# Patient Record
Sex: Female | Born: 1995 | Hispanic: No | Marital: Single
Health system: Midwestern US, Community
[De-identification: ages and names within clinical notes are randomized; demographics above are authoritative.]

## PROBLEM LIST (undated history)

## (undated) DIAGNOSIS — E282 Polycystic ovarian syndrome: Secondary | ICD-10-CM

## (undated) DIAGNOSIS — I10 Essential (primary) hypertension: Secondary | ICD-10-CM

## (undated) HISTORY — PX: NO PAST SURGERIES: SHX2092

---

## 2015-07-06 ENCOUNTER — Encounter: Payer: BLUE CROSS/BLUE SHIELD | Primary: Family Medicine

## 2015-07-19 ENCOUNTER — Inpatient Hospital Stay: Admit: 2015-07-19 | Payer: BLUE CROSS/BLUE SHIELD | Primary: Family Medicine

## 2015-07-19 DIAGNOSIS — E669 Obesity, unspecified: Secondary | ICD-10-CM

## 2015-07-19 NOTE — Progress Notes (Signed)
[facility]  [address], [place], [place] [id number] - Phone: [phone]     Nutrition Assessment ??? Medical Nutrition Therapy   Outpatient  Initial Evaluation         Patient Name: Stacy Green DOB: 10/22/1995   Treatment Diagnosis: Eino Farberbersity Onset Date:    Referral Source: Caroleen Hammanamle, Anant, MD Start of Care [facility](SOC): 07/19/2015     Ht:  68 in  cm Wt:  292 lb  kg   BMI: 44.4  BMR female    BMR female 2100     Diagnosis:         Medications of Nutritional Significance:   N/a     Subjective/Assessment:  Pt is a 20 yo female who is looking to lose weight and live a healthier lifestyle. She has recently been diagnosed with an enlarged liver/fatty liver. She lives with her boyfriend and the prepare meals together. 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 SHe drinks about 4 sodas a week and does eat friend foods. She does not drink or smoke. Pt has been walking with friends about 3 times per week when able. Discussed eating with 1-2 hours of waking, going no longer than 5 hours without eating, but no closer than 3 hours, and stopping eating 2 hours before bed. Discussed she should drink water and Sugar Free beverages only. Weaning off fried foods as well. Complete at least 120 mins of physical activity each week, divided as schedule permits. Pt is motivated to make changes especially with having Live issues. Taught about meal timing, composition, and balanced meals/portion control. Pt expressed comprehension, high motivation, and compliance is expected.         Current Eating Patterns:  0900-1000: wake up  1300-1400: fried chicken and potato wedges  1930: fried chicken and potato wedges  2200: chips or cookie  2430: bed     Handouts/  Information Provided:   Carbohydrates    Protein    Fiber    Serving Sizes    Meal and Snack Ideas    Food Journals   Diabetes    Cholesterol    Sodium     Gen Nutr Guidelines    SBGM Guidelines    Others: Veggies   Estimate Needs:   Calories:  1900 Protein: 166 Carbs: 166 Fat: 63   Kcal/day  g/day  g/day  g/day         percent: 35 percent: 35 percent: 30     Nutritional Goal - To promote lifestyle changes to result in:      weight loss    Improved diabetic control    Decreased cholesterol levels    Decreased blood pressure    weight maintenance   Preventing any interactions associated with food allergies    Adequate weight gain toward goal weight    Other: Fatty Liver       Recommendations: 1. Follow good meal timing  2. Never eat carbs alone, always pair with protein, use the plate method  3. Carbs: 30-45 gm per meal; 15-30 gm at snacks; start to wean off fried foods and soda  4. Exercise 3-4 times per week for 30 min     Information Reviewed with: Patient   Potential Barriers to Learning: None     Dietitian Signature: Aundra MilletKari Euclide Granito, MA RD Date: 07/19/2015   Follow-up: 09-02-15 @ 1000 Time: 2:39 PM

## 2015-09-02 ENCOUNTER — Encounter: Primary: Family Medicine

## 2015-09-06 ENCOUNTER — Encounter: Payer: BLUE CROSS/BLUE SHIELD | Primary: Family Medicine

## 2019-08-04 ENCOUNTER — Ambulatory Visit: Admit: 2019-08-04 | Discharge: 2019-08-04 | Payer: BLUE CROSS/BLUE SHIELD | Attending: Family | Primary: Family Medicine

## 2019-08-04 ENCOUNTER — Ambulatory Visit: Attending: Family | Primary: Family Medicine

## 2019-08-04 NOTE — Progress Notes (Signed)
Initial Consultation for Bariatric Surgery   Stacy Green is a 24 y.o. female who comes into the office today for initial consultation for the surgical options for the treatment of morbid obesity. The patient initially identified obesity at the age of 17 and at age 17 weighed 250 lbs. She has tried a variety of unsupervised weight-loss attempts including self imposed and exercise, but has yet to meet with lasting success.  Maximum weight lost on a diet is about 10 lbs, but that the weight loss always seems to return. Today, the patient is  Height: 5' 8" (172.7 cm) tall, Weight: 153.3 kg (338 lb) lbs for a Body mass index is 51.39 kg/m??.  It is due to the patient's severe obesity, which is further complicated by PCOS/insulin rresistance   that the patient is now seeking out bariatric surgery, specifically, sleeve gastrectomy.  Describes herself as a snacker on chips, drinking water/tea/soda/ juice.     Past Medical History:   Diagnosis Date   ??? PCOS (polycystic ovarian syndrome)      History reviewed. No pertinent surgical history.    No Known Allergies    Social History     Tobacco Use   ??? Smoking status: Never Smoker   ??? Smokeless tobacco: Never Used   Substance Use Topics   ??? Alcohol use: Yes     Alcohol/week: 1.0 standard drinks     Types: 1 Glasses of wine per week   ??? Drug use: Never     No family history on file.    No family status information on file.     Review of Systems:  Positive in BOLD  CONST: Fever, weight loss, fatigue or chills  GI: Nausea, vomiting, abdominal pain, change in bowel habits,  hematochezia, melena, and GERD   INTEG: Dermatitis, abnormal moles  HEENT: Recent changes in vision, vertigo, epistaxis, dysphagia and hoarseness  CV: Chest pain, palpitations, HTN, edema and varicosities  RESP: Cough, shortness of breath, wheezing, hemoptysis, snoring and reactive airway disease  GU: Hematuria, dysuria, frequency, urgency, nocturia and stress urinary incontinence   MS: Weakness, joint  pain and arthritis  ENDO: Diabetes, insulin resistant, thyroid disease, polyuria, polydipsia, polyphagia, poor wound healing, heat intolerance, cold intolerance  LYMPH/HEME: Anemia, bruising and history of blood transfusions  NEURO: Dizziness, headache, fainting, seizures and stroke  PSYCH: Anxiety and depression    Physical Exam    Visit Vitals  BP 125/81   Pulse 98   Temp 97.7 ??F (36.5 ??C)   Resp 17   Ht 5' 8" (1.727 m)   Wt 153.3 kg (338 lb)   SpO2 98%   BMI 51.39 kg/m??             Physical Exam  Vitals and nursing note reviewed.   Constitutional:       Appearance: She is obese.   HENT:      Head: Normocephalic and atraumatic.   Eyes:      Pupils: Pupils are equal, round, and reactive to light.   Neck:      Vascular: No carotid bruit.   Cardiovascular:      Rate and Rhythm: Normal rate.      Pulses: Normal pulses.      Heart sounds: Normal heart sounds.   Pulmonary:      Effort: Pulmonary effort is normal.   Abdominal:      General: Bowel sounds are normal. There is no distension.      Palpations: Abdomen is soft.   There is no mass.      Tenderness: There is no abdominal tenderness.      Hernia: No hernia is present.   Musculoskeletal:         General: No swelling or tenderness. Normal range of motion.      Cervical back: Normal range of motion and neck supple. No tenderness.      Right lower leg: No edema.      Left lower leg: No edema.   Skin:     General: Skin is warm and dry.      Capillary Refill: Capillary refill takes less than 2 seconds.   Neurological:      General: No focal deficit present.      Mental Status: She is alert and oriented to person, place, and time.   Psychiatric:         Mood and Affect: Mood and affect normal.         Behavior: Behavior normal.             Impression:  Stacy Green is a 24 y.o. female who is suffering from morbid obesity with a BMI of 51  and comorbidities including PCOS and insulin resistance   who would benefit from bariatric surgery.  We have had an extensive  discussion with regard to the risks, benefits and likely outcomes of the operation. We've discussed the restrictive and malabsorptive nature of the gastric bypass and compared and contrasted with the sleeve gastrectomy.  The patient understands the likelihood of losing approximately 80% of their excess weight in 12 to 18 months. She also understands the risks including but not limited to bleeding, infection, need for reoperation, ulcers, leaks and strictures, bowel obstruction secondary to adhesions and internal hernias, DVT, PE, heart attack, stroke, and death. Patient also understands risks of inadequate weight loss, excess weight loss, vitamin insufficiency, protein malnutrition, excess skin, and loss of hair.  We have reviewed the components of a successful postoperative course including requirement for a high protein, low carbohydrate diet, 60 oz a day of zero calorie liquids, daily vitamin supplementation, daily exercise, regular follow-up, and participation in support groups. At this time we will enroll the patient in our bariatric program, undertake routine laboratory evaluation, chest X-ray, EKG, possible UGI and evaluation by  nutritionist as well as psychologist and pending their satisfactory completion of the preop evaluation, plan to perform a undecided.    The patient clearly understands that they have to complete the duration of WLT consecutively and if they miss a month restart will be required. Additionally, no showing the RD appointments may result in removal from the surgical weight loss program.

## 2019-08-04 NOTE — Progress Notes (Signed)
Stacy Green is a 24 y.o. female  Chief Complaint   Patient presents with   ??? New Patient     GBP consult     Visit Vitals  BP 125/81   Pulse 98   Temp 97.7 ??F (36.5 ??C)   Resp 17   Ht 5' 8" (1.727 m)   Wt 338 lb (153.3 kg)   SpO2 98%   BMI 51.39 kg/m??

## 2019-08-04 NOTE — Progress Notes (Signed)
 Stacy Green is a 24 y.o. female  Chief Complaint   Patient presents with   . New Patient     GBP consult     Visit Vitals  BP 125/81   Pulse 98   Temp 97.7 F (36.5 C)   Resp 17   Ht 5' 8 (1.727 m)   Wt 338 lb (153.3 kg)   SpO2 98%   BMI 51.39 kg/m

## 2019-08-04 NOTE — Progress Notes (Signed)
Initial Consultation for Bariatric Surgery   Horace Wishon is a 24 y.o. female who comes into the office today for initial consultation for the surgical options for the treatment of morbid obesity. The patient initially identified obesity at the age of 68 and at age 21 weighed 250 lbs. She has tried a variety of unsupervised weight-loss attempts including self imposed and exercise, but has yet to meet with lasting success.  Maximum weight lost on a diet is about 10 lbs, but that the weight loss always seems to return. Today, the patient is  Height: 5\' 8"  (172.7 cm) tall, Weight: 153.3 kg (338 lb) lbs for a Body mass index is 51.39 kg/m??.  It is due to the patient's severe obesity, which is further complicated by PCOS/insulin rresistance   that the patient is now seeking out bariatric surgery, specifically, sleeve gastrectomy.  Describes herself as a Engineer, petroleum on chips, drinking water/tea/soda/ juice.     Past Medical History:   Diagnosis Date   ??? PCOS (polycystic ovarian syndrome)      History reviewed. No pertinent surgical history.    No Known Allergies    Social History     Tobacco Use   ??? Smoking status: Never Smoker   ??? Smokeless tobacco: Never Used   Substance Use Topics   ??? Alcohol use: Yes     Alcohol/week: 1.0 standard drinks     Types: 1 Glasses of wine per week   ??? Drug use: Never     No family history on file.    No family status information on file.     Review of Systems:  Positive in BOLD  CONST: Fever, weight loss, fatigue or chills  GI: Nausea, vomiting, abdominal pain, change in bowel habits,  hematochezia, melena, and GERD   INTEG: Dermatitis, abnormal moles  HEENT: Recent changes in vision, vertigo, epistaxis, dysphagia and hoarseness  CV: Chest pain, palpitations, HTN, edema and varicosities  RESP: Cough, shortness of breath, wheezing, hemoptysis, snoring and reactive airway disease  GU: Hematuria, dysuria, frequency, urgency, nocturia and stress urinary incontinence   MS: Weakness, joint  pain and arthritis  ENDO: Diabetes, insulin resistant, thyroid disease, polyuria, polydipsia, polyphagia, poor wound healing, heat intolerance, cold intolerance  LYMPH/HEME: Anemia, bruising and history of blood transfusions  NEURO: Dizziness, headache, fainting, seizures and stroke  PSYCH: Anxiety and depression    Physical Exam    Visit Vitals  BP 125/81   Pulse 98   Temp 97.7 ??F (36.5 ??C)   Resp 17   Ht 5\' 8"  (1.727 m)   Wt 153.3 kg (338 lb)   SpO2 98%   BMI 51.39 kg/m??             Physical Exam  Vitals and nursing note reviewed.   Constitutional:       Appearance: She is obese.   HENT:      Head: Normocephalic and atraumatic.   Eyes:      Pupils: Pupils are equal, round, and reactive to light.   Neck:      Vascular: No carotid bruit.   Cardiovascular:      Rate and Rhythm: Normal rate.      Pulses: Normal pulses.      Heart sounds: Normal heart sounds.   Pulmonary:      Effort: Pulmonary effort is normal.   Abdominal:      General: Bowel sounds are normal. There is no distension.      Palpations: Abdomen is soft.  There is no mass.      Tenderness: There is no abdominal tenderness.      Hernia: No hernia is present.   Musculoskeletal:         General: No swelling or tenderness. Normal range of motion.      Cervical back: Normal range of motion and neck supple. No tenderness.      Right lower leg: No edema.      Left lower leg: No edema.   Skin:     General: Skin is warm and dry.      Capillary Refill: Capillary refill takes less than 2 seconds.   Neurological:      General: No focal deficit present.      Mental Status: She is alert and oriented to person, place, and time.   Psychiatric:         Mood and Affect: Mood and affect normal.         Behavior: Behavior normal.             Impression:  Marlisa Caridi is a 24 y.o. female who is suffering from morbid obesity with a BMI of 51  and comorbidities including PCOS and insulin resistance   who would benefit from bariatric surgery.  We have had an extensive  discussion with regard to the risks, benefits and likely outcomes of the operation. We've discussed the restrictive and malabsorptive nature of the gastric bypass and compared and contrasted with the sleeve gastrectomy.  The patient understands the likelihood of losing approximately 80% of their excess weight in 12 to 18 months. She also understands the risks including but not limited to bleeding, infection, need for reoperation, ulcers, leaks and strictures, bowel obstruction secondary to adhesions and internal hernias, DVT, PE, heart attack, stroke, and death. Patient also understands risks of inadequate weight loss, excess weight loss, vitamin insufficiency, protein malnutrition, excess skin, and loss of hair.  We have reviewed the components of a successful postoperative course including requirement for a high protein, low carbohydrate diet, 60 oz a day of zero calorie liquids, daily vitamin supplementation, daily exercise, regular follow-up, and participation in support groups. At this time we will enroll the patient in our bariatric program, undertake routine laboratory evaluation, chest X-ray, EKG, possible UGI and evaluation by  nutritionist as well as psychologist and pending their satisfactory completion of the preop evaluation, plan to perform a undecided.    The patient clearly understands that they have to complete the duration of WLT consecutively and if they miss a month restart will be required. Additionally, no showing the RD appointments may result in removal from the surgical weight loss program.

## 2019-08-14 NOTE — Telephone Encounter (Signed)
Hi Melissa, did you call to register the patient?  I didn't see any note about it.  Just asking.     Monica

## 2019-08-14 NOTE — Telephone Encounter (Signed)
Hi Melissa, did you call to register the patient?  I didn't see any note about it.  Just asking.     [employer]

## 2019-09-12 ENCOUNTER — Inpatient Hospital Stay: Admit: 2019-09-12 | Payer: BLUE CROSS/BLUE SHIELD | Primary: Family Medicine

## 2019-09-12 NOTE — Progress Notes (Signed)
 [facility]  [address] [facility] [address] [facility], [address]    Patient's Name: Stacy Green   Age: 24 y.o.  Date of Birth: 02/18/1996   Sex: female    Date:   09/12/2019    Insurance:              Session: 1 of  12  Revision:     Surgeon:  Dr. Jacques    Height: 5 f 8   Weight:    332      Lbs.     BMI: 53.6   Pounds Lost since last month: 6                 Pounds Gained since last month: 0    Starting Weight: 338     Previous Month's Weight: 338  Overall Pounds Lost: 6   Overall Pounds Gained: 0      Do you smoke?  None    Alcohol intake:  Number of drinks at a time:  1-2  Number of times a week: Sometimes, not weekly    Class Guidelines    Guidelines are reviewed with patient at the start of every class.    1. Patient understands that weight loss trial classes must be consecutive.  Patient understands if they miss a class, it is their responsibility to contact me to reschedule class.  I will reach out to patient after their first no show.  2.  Patient understands the expectations that weight maintenance/weight loss is expected during the classes.  Failure to demonstrate changes may result in one extra month of weight loss trial, followed by going back to see the surgeon.  Patient understands that they CAN NOT gain any weight during the weight loss trial.  Gaining weight will result in extra classes.  3. Patient is also instructed to be doing their labs, blood work, psych visit, support group and any other test that the surgeon has used while they are working on their weight loss trial.  4.  Patient was instructed to bring their blue binder to every class and appointment.      Other Pertinent Information:     Changes Made Since Last Class: Eating out less.  Cutting back on soda.      Eating Habits and Behaviors    Today in class, we started talking about the key diet principles.  We first focused on stopping liquid calories.  Patient was also educated on  carbohydrates.  Patient was instructed to start cutting out bread, rice, and pasta from the diet and start focusing more on meat and vegetables.      I then gave a power point, which focused on Label Reading.  In class, I gave patients a labels and we worked through a series of questions to help patients have a better understanding of label reading.  Patient was instructed to review the serving size.  Patient was encouraged to focus on protein and carbohydrates.  We also did a few label reading activities to help the patient become more familiar with label reading.      Patient's current diet habits include: 2 melas per day.  She states she is eating breakfast and dinner because she works the night shift.  She states after watching the video, she is going to work harder at 3 meals per day.  She states she is eating oatmeal or cereal.  We have talked about concentrating more  on protein at meals.  She states dinner may be meat and vegetables.  It may be spaghetti.  She states this just varies.  She is snacking on chips or kettle chips, which I talked about the carbohydrates that are in these and need to be cut out.  She is eating sweets 3 x a week, but states they are sugar free.  I have addressed with her, that sugar free does not mean calorie or carbohydrate free.    She states she is drinking sweet tea 2 x a week.  She understands the need to stop the liquid calories.        Physical Activity/Exercise    Comments:    We talked about exercise.  Patient was given reasons of why exercise is so important and how that can help with their long-term success.  I have encouraged patient to get a support system to help with the activity.    Currently for activity, patient is not doing anything.      Behavior Modification       Comments:  Behavior modifications were reinforced.  This included not eating in front of the TV, which could lead to bigger portions and eating when one is not hungry.  We also talked about the  importance of eating 3 meals per day.  Patient was encouraged to food journal to keep their daily carbohydrates less than 30 grams per meal.      Goals that patient wants to work on includes:  1. Prepping more and also eating 3 meals per day.  2.       Jacqueline Browning, MS RD  09/12/2019

## 2019-10-02 ENCOUNTER — Other Ambulatory Visit: Payer: Self-pay

## 2019-10-02 ENCOUNTER — Encounter (HOSPITAL_COMMUNITY): Payer: Self-pay | Admitting: *Deleted

## 2019-10-02 DIAGNOSIS — I10 Essential (primary) hypertension: Secondary | ICD-10-CM | POA: Insufficient documentation

## 2019-10-02 DIAGNOSIS — R102 Pelvic and perineal pain: Secondary | ICD-10-CM | POA: Diagnosis present

## 2019-10-02 NOTE — ED Triage Notes (Signed)
Pt c/o pelvic pain and vaginal bleeding x 3 weeks; pt states she was seen at her PCP and had a pelvic exam and has not received the results yet; pt states she took an at home pregnancy test tonight and it was positive

## 2019-10-03 ENCOUNTER — Emergency Department (HOSPITAL_COMMUNITY)
Admission: EM | Admit: 2019-10-03 | Discharge: 2019-10-03 | Disposition: A | Discharge status: Home / Self Care | Payer: BC Managed Care – PPO | Attending: Emergency Medicine | Admitting: Emergency Medicine

## 2019-10-03 DIAGNOSIS — R102 Pelvic and perineal pain: Secondary | ICD-10-CM

## 2019-10-03 HISTORY — DX: Essential (primary) hypertension: I10

## 2019-10-03 HISTORY — DX: Polycystic ovarian syndrome: E28.2

## 2019-10-03 LAB — URINALYSIS, ROUTINE W REFLEX MICROSCOPIC
Bilirubin Urine: NEGATIVE
Glucose, UA: NEGATIVE mg/dL
Ketones, ur: NEGATIVE mg/dL
Leukocytes,Ua: NEGATIVE
Nitrite: NEGATIVE
Protein, ur: NEGATIVE mg/dL
Specific Gravity, Urine: 1.025 (ref 1.005–1.030)
pH: 5 (ref 5.0–8.0)

## 2019-10-03 LAB — I-STAT BETA HCG BLOOD, ED (MC, WL, AP ONLY): I-stat hCG, quantitative: 61.7 m[IU]/mL — ABNORMAL HIGH (ref <5)

## 2019-10-03 LAB — HCG, QUANTITATIVE, PREGNANCY: hCG, Beta Chain, Quant, S: 72 m[IU]/mL — ABNORMAL HIGH (ref <5)

## 2019-10-03 LAB — POC URINE PREG, ED: Preg Test, Ur: NEGATIVE

## 2019-10-03 NOTE — ED Provider Notes (Signed)
[facility] EMERGENCY DEPARTMENT Provider Note   CSN: [id number] Arrival date & time: 10/02/19  2105     History Chief Complaint  Patient presents with  . Pelvic Pain    Theresa Patterson is a 24 y.o. female.  The history is provided by the patient.  Pelvic Pain This is a recurrent problem. The current episode started 12 to 24 hours ago. The problem has not changed since onset.Associated symptoms include abdominal pain. Pertinent negatives include no chest pain and no shortness of breath. Nothing aggravates the symptoms. Nothing relieves the symptoms.   Patient presents for pelvic pain and bleeding.  She reports recent issue of postcoital vaginal bleeding.  Seen by her PCP and had a Pap smear performed 1 week ago.  She was told her cervix was inflamed.  She reports all of her testing from her Pap smear was negative.  Her symptoms improved. However she had intercourse approximately 24 hours ago.  She then began having bleeding the next day.  She is now having mild lower pelvic pain  She reports home pregnancy test is positive.  She has never been pregnant  Past Medical History:  Diagnosis Date  . Hypertension   . Polycystic ovary syndrome     There are no problems to display for this patient.   History reviewed. No pertinent surgical history.   OB History   No obstetric history on file.     History reviewed. No pertinent family history.  Social History   Tobacco Use  . Smoking status: Never Smoker  . Smokeless tobacco: Never Used  Vaping Use  . Vaping Use: Never used  Substance Use Topics  . Alcohol use: Yes    Comment: occasionally  . Drug use: Not Currently    Home Medications Prior to Admission medications   Not on File    Allergies    Patient has no known allergies.  Review of Systems   Review of Systems  Constitutional: Negative for fever.  Respiratory: Negative for shortness of breath.   Cardiovascular: Negative for chest pain.  Gastrointestinal:  Positive for abdominal pain.  Genitourinary: Positive for flank pain, pelvic pain and vaginal bleeding. Negative for dyspareunia and dysuria.       Postcoital vaginal bleeding  All other systems reviewed and are negative.   Physical Exam Updated Vital Signs BP (!) 150/96 (BP Location: Right Arm)   Pulse 72   Temp 98.8 F (37.1 C) (Oral)   Resp 18   Ht 1.727 m (5\' 8" )   Wt (!) 150.6 kg   SpO2 98%   BMI 50.48 kg/m   Physical Exam CONSTITUTIONAL: Well developed/well nourished, no distress HEAD: Normocephalic/atraumatic EYES: EOMI/PERRL ENMT: Mucous membranes moist NECK: supple no meningeal signs SPINE/BACK:entire spine nontender CV: S1/S2 noted, no murmurs/rubs/gallops noted LUNGS: Lungs are clear to auscultation bilaterally, no apparent distress ABDOMEN: soft, nontender, no rebound or guarding, bowel sounds noted throughout abdomen, chaperone present for exam GU:no cva tenderness, patient declines pelvic exam NEURO: Pt is awake/alert/appropriate, moves all extremitiesx4.  No facial droop.   EXTREMITIES: pulses normal/equal, full ROM SKIN: warm, color normal PSYCH: no abnormalities of mood noted, alert and oriented to situation  ED Results / Procedures / Treatments   Labs (all labs ordered are listed, but only abnormal results are displayed) Labs Reviewed  URINALYSIS, ROUTINE W REFLEX MICROSCOPIC - Abnormal; Notable for the following components:      Result Value   APPearance HAZY (*)    Hgb urine dipstick LARGE (*)  Bacteria, UA RARE (*)    All other components within normal limits  HCG, QUANTITATIVE, PREGNANCY - Abnormal; Notable for the following components:   hCG, Beta Chain, Quant, S 72 (*)    All other components within normal limits  I-STAT BETA HCG BLOOD, ED ([facility], [facility], [facility] ONLY) - Abnormal; Notable for the following components:   I-stat hCG, quantitative 61.7 (*)    All other components within normal limits  POC URINE PREG, ED    EKG None  Radiology No  results found.  Procedures Procedures   Medications Ordered in ED Medications - No data to display  ED Course  I have reviewed the triage vital signs and the nursing notes.  Pertinent labs   results that were available during my care of the patient were reviewed by me and considered in my medical decision making (see chart for details).    MDM Rules/Calculators/A&P                          1:51 AM Patient declines pelvic exam, this appears reasonable since she just had Pap smear a week ago.  She has no concern for STDs We will check beta-hCG via labs Urinalysis pending 4:05 AM Beta hCG 72.  Patient denies any pain complaints.  She denies any bleeding at this time.  She had already declined pelvic exam. Patient is overall well-appearing. Advised this could be very early pregnancy. Patient with clinical stability, less likely ectopic pregnancy. Patient has no local OB/GYN care because she is from [place] with plans to move here. She is leaving town in 48 hours. She will return on the morning of July 24 for recheck beta hCG. I advised that if there is any new abdominal pain or heavy vaginal bleeding, she will return immediately prior to then  Final Clinical Impression(s) / ED Diagnoses Final diagnoses:  Pelvic pain in female    Rx / DC Orders ED Discharge Orders    None       Zadie Rhine, MD 10/03/19 [phone]

## 2019-10-03 NOTE — Discharge Instructions (Addendum)
PLEASE RETURN Saturday MORNING FOR A REPEAT HORMONE LEVEL TEST IF YOU HAVE ANY NEW PAIN, HEAVY BLEEDING IN THE NEXT 24 HOURS PLEASE RETURN TO THE ER

## 2019-10-04 ENCOUNTER — Other Ambulatory Visit: Payer: Self-pay

## 2019-10-04 ENCOUNTER — Encounter (HOSPITAL_COMMUNITY): Payer: Self-pay

## 2019-10-04 ENCOUNTER — Emergency Department (HOSPITAL_COMMUNITY)
Admission: EM | Admit: 2019-10-04 | Discharge: 2019-10-04 | Disposition: A | Discharge status: Home / Self Care | Payer: BC Managed Care – PPO | Attending: Emergency Medicine | Admitting: Emergency Medicine

## 2019-10-04 DIAGNOSIS — O200 Threatened abortion: Secondary | ICD-10-CM | POA: Diagnosis not present

## 2019-10-04 DIAGNOSIS — I10 Essential (primary) hypertension: Secondary | ICD-10-CM | POA: Diagnosis not present

## 2019-10-04 DIAGNOSIS — N939 Abnormal uterine and vaginal bleeding, unspecified: Secondary | ICD-10-CM | POA: Diagnosis present

## 2019-10-04 LAB — HCG, QUANTITATIVE, PREGNANCY: hCG, Beta Chain, Quant, S: 31 m[IU]/mL — ABNORMAL HIGH (ref <5)

## 2019-10-04 NOTE — ED Triage Notes (Signed)
Pt was told to come back for a repeat HCG. She was here 2 nights ago for pelvic pain and vaginal bleeding for the last 3 weeks. Home pregnancy test was positive

## 2019-10-04 NOTE — Discharge Instructions (Addendum)
Follow-up with your OB/GYN Monday or Tuesday.  If you start having severe abdominal pain or severe bleeding go to [facility] the OB/GYN division

## 2019-10-04 NOTE — ED Provider Notes (Signed)
[facility] EMERGENCY DEPARTMENT Provider Note   CSN: [id number] Arrival date & time: 10/04/19  3875     History Chief Complaint  Patient presents with  . blood work    Theresa Patterson is a 24 y.o. female.  Patient complains of some vaginal bleeding.  She was seen here a day and half ago had a positive pregnancy.  She has come back today for repeat quant  The history is provided by the patient. No language interpreter was used.  Vaginal Bleeding Quality:  Dark red Severity:  Mild Onset quality:  Sudden Timing:  Intermittent Progression:  Waxing and waning Chronicity:  New Menstrual history:  Irregular Possible pregnancy: yes   Context: not after intercourse   Relieved by:  Nothing Worsened by:  Nothing Ineffective treatments:  None tried Associated symptoms: no abdominal pain, no back pain and no fatigue        Past Medical History:  Diagnosis Date  . Hypertension   . Polycystic ovary syndrome     There are no problems to display for this patient.   History reviewed. No pertinent surgical history.   OB History    Gravida  1   Para      Term      Preterm      AB      Living        SAB      TAB      Ectopic      Multiple      Live Births              No family history on file.  Social History   Tobacco Use  . Smoking status: Never Smoker  . Smokeless tobacco: Never Used  Vaping Use  . Vaping Use: Never used  Substance Use Topics  . Alcohol use: Yes    Comment: occasionally  . Drug use: Not Currently    Home Medications Prior to Admission medications   Not on File    Allergies    Patient has no known allergies.  Review of Systems   Review of Systems  Constitutional: Negative for appetite change and fatigue.  HENT: Negative for congestion, ear discharge and sinus pressure.   Eyes: Negative for discharge.  Respiratory: Negative for cough.   Cardiovascular: Negative for chest pain.  Gastrointestinal: Negative for  abdominal pain and diarrhea.  Genitourinary: Positive for vaginal bleeding. Negative for frequency and hematuria.  Musculoskeletal: Negative for back pain.  Skin: Negative for rash.  Neurological: Negative for seizures and headaches.  Psychiatric/Behavioral: Negative for hallucinations.    Physical Exam Updated Vital Signs BP (!) 136/87 (BP Location: Left Arm)   Pulse 90   Temp 98.3 F (36.8 C) (Oral)   Resp 14   SpO2 100%   Physical Exam Vitals and nursing note reviewed.  Constitutional:      Appearance: She is well-developed.  HENT:     Head: Normocephalic.     Nose: Nose normal.  Eyes:     General: No scleral icterus.    Conjunctiva/sclera: Conjunctivae normal.  Neck:     Thyroid: No thyromegaly.  Cardiovascular:     Rate and Rhythm: Normal rate and regular rhythm.     Heart sounds: No murmur heard.  No friction rub. No gallop.   Pulmonary:     Breath sounds: No stridor. No wheezing or rales.  Chest:     Chest wall: No tenderness.  Abdominal:     General: There is  no distension.     Tenderness: There is no abdominal tenderness. There is no rebound.  Musculoskeletal:        General: Normal range of motion.     Cervical back: Neck supple.  Lymphadenopathy:     Cervical: No cervical adenopathy.  Skin:    Findings: No erythema or rash.  Neurological:     Mental Status: She is oriented to person, place, and time.     Motor: No abnormal muscle tone.     Coordination: Coordination normal.  Psychiatric:        Behavior: Behavior normal.     ED Results / Procedures / Treatments   Labs (all labs ordered are listed, but only abnormal results are displayed) Labs Reviewed  HCG, QUANTITATIVE, PREGNANCY - Abnormal; Notable for the following components:      Result Value   hCG, Beta Chain, Quant, S 31 (*)    All other components within normal limits    EKG None  Radiology No results found.  Procedures Procedures (including critical care time)  Medications  Ordered in ED Medications - No data to display  ED Course  I have reviewed the triage vital signs and the nursing notes.  Pertinent labs & imaging results that were available during my care of the patient were reviewed by me and considered in my medical decision making (see chart for details).    MDM Rules/Calculators/A&P                          Patient with a decreasing quantitative beta-hCG.  Patient most likely having a miscarriage.  She will follow-up with her OB/GYN next week.  She will return sooner problems Final Clinical Impression(s) / ED Diagnoses Final diagnoses:  Threatened abortion    Rx / DC Orders ED Discharge Orders    None       Bethann Berkshire, MD 10/04/19 1012

## 2019-10-17 ENCOUNTER — Inpatient Hospital Stay: Admit: 2019-10-17 | Payer: BLUE CROSS/BLUE SHIELD | Primary: Family Medicine

## 2019-10-17 NOTE — Progress Notes (Signed)
 [facility]  [address] [facility] [address], [address]    Patient's Name: Stacy Green   Age: 24 y.o.  Date of Birth: 15-Aug-1995   Sex: female    Date:   10/17/2019    Insurance:            Session: 2 of  12  Revision:   Surgeon:  Dr. Jacques    Height: 5 f 8 Weight:    329      Lbs.   BMI: 50.1   Pounds Lost since last month: 3               Pounds Gained since last month: 0      Starting Weight: 338   Previous Month's Weight: 332  Overall Pounds Lost: 9 Overall Pounds Gained: 0      Do you smoke?  None    Alcohol intake:  Number of drinks at a time:  None  Number of times a week: None    Class Guidelines    Guidelines are reviewed with patient at the start of every class.    1. Patient understands that weight loss trial classes must be consecutive.  Patient understands if they miss a class, it is their responsibility to contact me to reschedule class.  I will reach out to patient after their first no show.  2.  Patient understands the expectations that weight maintenance/weight loss is expected during the classes.  Failure to demonstrate changes may result in one extra month of weight loss trial, followed by going back to see the surgeon.  Patient understands that they CAN NOT gain any weight during the weight loss trial.  Gaining weight will result in extra classes.  3. Patient is also instructed to be doing their labs, blood work, psych visit, support group and any other test that the surgeon has used while they are working on their weight loss trial.  4.  Patient was instructed to bring their blue binder to every class and appointment.      Other Pertinent Information:     Changes Made Since Last Class: More meal prep and less eating out.    Eating Habits and Behaviors    Today in class, we reviewed the key diet principles.  I have talked to patient about pushing the fluid and working towards 64 ounces per day.  We focused on following a low-calorie  diet.  Patient was instructed to count their carbohydrates and try to keep their daily intake under 75 grams per day and try to keep their daily protein at 80 grams per day.  Patient was given examples of carbohydrates in starches.  Patient was encouraged to focus on meat and vegetables and begin cutting carbohydrates out.  We talked about foods that are protein-based and how to incorporate those into their meals.  I also reviewed with patient the importance of eating 3 meals per day and suggestions were made for breakfast items.    Patient's current diet habits include: 3 meals per day.  Patient is boiled egg and [place] sausage.  Lunch: Salad, chicken salad, meat and greens.  Patient states she is using a sectional plate to help with portion sizes.  She is snacking on [place] pepperoni and yogurt.  She states she is not eating out.  She is drinking 64 ounces of water per day.  She may have zero calorie Gatorade.  Physical Activity/Exercise    Comments:    We talked about exercise.  Patient was given reasons of why exercise is so important and how that can help with their long-term success.  I have encouraged patient to get a support system to help with the activity.    Currently for activity, patient is aiming for 3 days a week of activity depending on her schedule.      Behavior Modification       Comments:  During today's lesson, I gave a presentation called The 100-200 Calorie Mindless Margin.  The goal is to make modest daily 100-200 calorie reductions in certain things that the body won't notice.  One, 100-200 calorie change and would will look 10-20 pounds in one year.  An example could be cutting soda.  Patient was given a check off list and was encouraged to come up with 1-3 100 calories changes they could make.  The check off list is a daily tracker to see if these goals are being met.        Goals that patient wants to work on includes:  1. Increase exercise.   2.       Jacqueline Browning, MS  RD  10/17/2019

## 2019-11-14 ENCOUNTER — Inpatient Hospital Stay: Admit: 2019-11-14 | Payer: BLUE CROSS/BLUE SHIELD | Primary: Family Medicine

## 2019-11-14 NOTE — Progress Notes (Signed)
[facility]  [address] [facility] [address], [address]    Patient's Name: Stacy Green   Age: 24 y.o.  Date of Birth: [birth year]   Sex: female    Date:   11/14/2019    Insurance:              Session: 3 of  12  Surgeon:  Dr. Karleen Hampshire    Height: 5 f 8   Weight:    329      Lbs.     BMI: 50.1   Pounds Lost since last month: 0                Pounds Gained since last month: 0    Starting Weight: 338     Previous Month's Weight: 329  Overall Pounds Lost: 9   Overall Pounds Gained: 0    Smoking:  None    Alcohol intake:  Number of drinks at a time:  None  Number of times a week: None    Class Guidelines    Guidelines are reviewed with patient at the start of every class.    1. Patient understands that weight loss trial classes must be consecutive.  Patient understands if they miss a class, it is their responsibility to contact me to reschedule class.  I will reach out to patient after their first no show.  2.  Patient understands the expectations that weight maintenance/weight loss is expected during the classes.  Failure to demonstrate changes may result in one extra month of weight loss trial, followed by going back to see the surgeon.    3. Patient is also instructed to be doing their labs, blood work, psych visit, support group and any other test that the surgeon has used while they are working on their weight loss trial.    Other Pertinent Information:     Changes Made Since Last Class: n/a    Eating Habits and Behaviors      During today's class, we continued to focus on the key diet principles.  Patient was instructed to follow a low carbohydrate diet, focusing on meat and vegetables.  Patient was instructed to stop liquid calories and aim for 64 ounces of water per day. We focused on focusing in on bigger problem areas to start making changes to, such as reducing fast food intake, reducing carbonated beverages/soda intake, decreasing carbohydrates intake  daily, etc. We reviewed protein shakes and high protein yogurts to chose, as well.      During today's class, we also talked about how to read a label.  Patient was given information on:  1. The benefits of reading a label, which allowed one to compare the nutritional value of similar products and make healthy food decisions.   2. The ingredient list, which can help to determine if the food is heathy or something that fits into the diet.  3. The importance of reading the serving size and making sure to apply that to the portion size that they are consuming.    Patient was also educated on carbohydrates.  I talked to patient about:  1. The function of carbohydrates.  2. Foods that carbohydrate-heavy.  3. Patient was given the guidelines to keep their carbohydrates less than 75 grams per day in the pre-op phase.  4. Patient was also given ideas of low carb swaps, which include zucchini noodles, spaghetti squash, or cauliflower rice.   5. Lower carbohydrate  fruit options were discussed.   6. Discussed lower carb swaps to use instead of potato chips.     Patient's dietary habits include: Patient is eating 3 meal per day.  Meals are made up of boiled eggs, smoothies, chicken salad, meat, vegetables.  Portions are:  Sectional plate.  Patient is eating out: few times a week.   Patient is snacking on [place] pepperoni, blueberries, banana, yogurt.  I have talked to patient about some lower carbohydrate snack choices that focused more protein.  Patient is drinking 64 ounces of fluid per day.  Fluid intake is make up of: water.         Physical Activity/Exercise     Comments:     Currently for exercise, patient is doing cardio for 3-4 x a week.  I have talked to patient about some suggestions to start an exercise routine.  Patient is encouraged to purchase a pedometer and use this to track her steps.  I have made some suggestions to patient of ways to incorporate exercise in with a busy lifestyle.  We also talked about You  Tube videos that can be used for an exercise routine.         Behavior Modification  Comments:  Behavior modifications were discussed with the patient. Some of those behavior modifications include:  1. Emphasized the importance of eating slowly, not eating and drinking meals at the same time.    2.  Taking 20-30 minutes to eat a meal  3. I have encouraged patient to follow journal, which may be done by paper or tracking it an app, such as My Fitness Pal or Bariatastic.  Patient understands the importance of following through with these behaviors following surgery to aid in long term weight loss. Tips and advice were given on how to start implementing these into the patient's life.      Patient has not yet attended the required bariatric support group.    Goal patient has set for next month:  1. Maintain current habits.  2.      Adela Ports, MS RD  11/14/2019

## 2019-12-12 ENCOUNTER — Inpatient Hospital Stay: Admit: 2019-12-12 | Payer: BLUE CROSS/BLUE SHIELD | Primary: Family Medicine

## 2019-12-12 NOTE — Progress Notes (Signed)
 [facility]  [facility]  [address] [facility] [address], [address]    Patient's Name: Stacy Green   Age: 24 y.o.  Date of Birth: Dec 21, 1995   Sex: female      Insurance:              Session: 4 of  12  Surgeon:  Dr. Jacques    Height: 5 f 8 Weight:    329      Lbs.   BMI:    Pounds Lost since last month: 0               Pounds Gained since last month: 0    Starting Weight: 338   Previous Month's Weight: 329  Overall Pounds Lost: 9 Overall Pounds Gained: 0      Do you smoke?  None    Alcohol intake:  Number of drinks at a time:  None  Number of times a week: None    Class Guidelines    Guidelines are reviewed with patient at the start of every class.    1. Patient understands that weight loss trial classes must be consecutive.  Patient understands if they miss a class, it is their responsibility to contact me to reschedule class.  I will reach out to patient after their first no show.  2.  Patient understands the expectations that weight maintenance/weight loss is expected during the classes.  Failure to demonstrate changes may result in one extra month of weight loss trial, followed by going back to see the surgeon.    3. Patient is also instructed to be doing their labs, blood work, psych visit, support group and any other test that the surgeon has used while they are working on their weight loss trial.  4. Patient is instructed to bring their education binder to all classes.        Changes Made Since Last Class:     Eating Habits and Behaviors      Today we reviewed key diet principles.   We talked about patient following a low calorie/low carbohydrate diet while they are in weight loss trials.  To achieve this, patient is encouraged to avoid liquid calories, including alcohol, soda, sweet tea, and fruit juices.   Patient can cut carbohydrates by trying to stick to meat and vegetables.  Patient can also eat eggs, cheese, and good fat, while trying to eliminate  starches, such as pasta, rice, crackers, chips, popcorn.    I also gave a power point that included 21 Ways to Stay on Track with a Healthy Lifestyle.  Some of the food-related suggestions included drinking plenty of water or calorie-free beverages prior to their meal.  Patient is encouraged to to fill up on protein first, which is the ultimate fill-me up food.  We talked about the importance of eating breakfast and the effects that can happen if one skips meals, which includes eating larger portions, snacking more, and decreasing their metabolism.  With the suggestions in the power point, patient will be able to decrease their calories and carbohydrate intake.      Patient's dietary habits include: Patient is eating 3 meals per day.  Meals are made up of [place] sausage, bacon, boiled egg, fruit smoothies, chicken, vegetables.  Portions are:  Saucer size.    Patient's intake of bread, rice, pasta or other carbohydrates is:  1-2 x in the last month.  Patient is eating sweets 0 times  a week.  Patient is snacking on [place] pepperoni, fruit, tuna, starfish.  I have talked to patient about some lower carbohydrate snack choices that focused more protein. Patient is eating out: 2 x a month.    Patient is drinking 64 ounces of fluid per day.  Fluid intake is make up of: water.         Physical Activity/Exercise    Comments:   We talked about the importance of establishing a work out routine.  Patient is currently some walking for activity.  Goals have been set.    Behavior Modification       Comments:   Some of the behavior tips that were included in the power point, include being choosy about night time snacking.  Patient was encouraged to make the TV a no eating zone and not eat after 7 pm.  Patient is also encouraged to keep a food journal.      One of the other things we talked about during class is whether or not patient has a support system.      Patient has not been to a support group.      Goals set by Registered  Dietitian:  1. Stick to just strictly water.    Arlyne Schlossman, MS RD  12/12/2019

## 2020-01-16 ENCOUNTER — Inpatient Hospital Stay: Admit: 2020-01-16 | Payer: BLUE CROSS/BLUE SHIELD | Primary: Family Medicine

## 2020-01-16 NOTE — Progress Notes (Signed)
 [facility]  [address] [facility] [address], [address]    Patient's Name: Stacy Green   Age: 24 y.o.  Date of Birth: Feb 27, 1996   Sex: female      Insurance:            Session: 5 of  12  Revision:   Surgeon:  Dr. Jacques    Height: 5 f 8 Weight:    328      Lbs.   BMI: 49.9   Pounds Lost since last month: 1               Pounds Gained since last month: 0    Starting Weight: 338   Previous Month's Weight: 329  Overall Pounds Lost: 10 Overall Pounds Gained: 0      Do you smoke?  None    Alcohol intake:  Number of drinks at a time:  NOne  Number of times a week: None    Class Guidelines    Guidelines are reviewed with patient at the start of every class.    1. Patient understands that weight loss trial classes must be consecutive.  Patient understands if they miss a class, it is their responsibility to contact me to reschedule class.  I will reach out to patient after their first no show.  2.  Patient understands the expectations that weight maintenance/weight loss is expected during the classes.  Failure to demonstrate changes may result in one extra month of weight loss trial, followed by going back to see the surgeon.    3. Patient is also instructed to be doing their labs, blood work, psych visit, support group and any other test that the surgeon has used while they are working on their weight loss trial.    Other Pertinent Information:     Changes Made Since Last Class: No changes this month      Dietary Instruction    During today's class we continued to focus on the key diet principles.  Patient was instructed to follow a low carbohydrate diet, focusing on meat and vegetables.  Patient was instructed to stop liquid calories and aim for 64 ounces of water per day.      In class, I also gave patient a power point on surviving the holidays.  Some of the tips included survival tips for parties, including bringing their own low carbohydrate dish to a  potluck dinner and surveying the buffet line before they start filling up their plate.  Patient was given cooking alternatives, including using Splenda for sugar, substituting applesauce for oil in recipes, and using low fat plain yogurt instead of sour cream in dips.  Patient was also encouraged to be mindful of calories in alcohol.    Patient is eating 3 meals per day.  Patient states their last meal or snack of the day is at 8 pm and they eat 1-2 hours after getting up in the morning.  Patient is encouraged to eat within 1 hour of waking up and have a cut off time in the evening.  If patient is physically hungry, it is encouraged to have a protein-based snack.  Patient feels that their meals are: More chicken, fish, vegetables.  She states instead of eating pasta, she just ate broccoli and chicken.   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  I have made suggestions to use a smaller plate or to fill up on water before eating a meal.  Patient is eating out 1 times a week.  Choices when eating out include:  Seafood, chicken Cobb salad from [employer].  States she is trying to do more cooking at home.  Patient is eating bread, rice, pasta, crackers, etc. 0 times a week?  Patient is snacking on: veggie chips, [place] pepperoni, or yogurt.  Sweet intake is high.   We talked about dumping syndrome and the effects of eating sugar.  Patient is drinking 64 ounces of water, 0 ounces of soda, 0 ounces of sweet tea, 1-2 glasses of fruit juices, and 0 ounces of caffeine.  We talked about not drinking any liquid calories.      Physical Activity/Exercise    Patient is currently doing very little for activity.      Today's power point on surviving the holidays also included tips on exercising.  This included being creative during the holiday, walking stairs, mall walking, getting resistance bands.  Patient was encouraged not to be afraid to  excuse themselves from the table to go for a walk after they eat.      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      Behavior Modification    Reinforced behavior changes to make.  Patient was encouraged to keep their emotions in check.  Try to HALT and focus on whether they are eating out of hunger or if they are eating out of emotions.  Other eating behaviors included surveying the buffet line before starting to fill up their plate.  Patient was given a check off list and encouraged to monitor some of their eating behaviors, such as eating slowly, chewing their food thoroughly, and taking 20-30 minutes to eat a meal.    Weight loss surgery is important to the patient because:  States she wants to be healthier and have it help with her PCOS symptoms*    Challenges or barriers that they may encounter with making changes after surgery are? Just started school, so trying to work on meal prep    Patient has not been to a support group meeting.      Non-Scale that patient set for next month include:  Be closer to 300 pounds by surgery.       Jacqueline Browning, MS RD  01/16/2020

## 2020-02-13 ENCOUNTER — Inpatient Hospital Stay: Admit: 2020-02-13 | Payer: BLUE CROSS/BLUE SHIELD | Primary: Family Medicine

## 2020-02-13 NOTE — Progress Notes (Signed)
 [facility]  [address] [facility] [address], [address]    Patient's Name: Stacy Green   Age: 24 y.o.  Date of Birth: 05/25/1995   Sex: female    Date:   02/13/2020          Session: 6 of  12   Surgeon:  Dr. Jacques    Height: 5 f 8 Weight:    329      Lbs.   BMI:    Pounds Lost since last month: 0               Pounds Gained since last month: 1    Starting Weight: 338   Previous Month's Weight: 328  Overall Pounds Lost: 9 Overall Pounds Gained: 0      Do you smoke?  None    Alcohol intake:  Number of drinks at a time:  None  Number of times a week: None    Class Guidelines    Guidelines are reviewed with patient at the start of every class.    1. Patient understands that weight loss trial classes must be consecutive.  Patient understands if they miss a class, it is their responsibility to contact me to reschedule class.  I will reach out to patient after their first no show.  2.  Patient understands the expectations that weight maintenance/weight loss is expected during the classes.  Failure to demonstrate changes may result in one extra month of weight loss trial, followed by going back to see the surgeon.    3. Patient is also instructed to be doing their labs, blood work, psych visit, support group and any other test that the surgeon has used while they are working on their weight loss trial.    Other Pertinent Information:     Patient has not attended support group, plans on attending January or February.    Changes Made Since Last Class: None    Eating Habits and Behaviors      Today we reviewed key diet principles.  We talked about protein drinks and ones that would be okay.  Patient was encouraged to start getting into a routine now and drinking a shake.  Patient may use for a meal replacement or a snack.  Patient was also encouraged to stop liquid calories.  We talked about fluid choices that would be okay.  We also spent a lot of time talking  about carbohydrates.  Patient was encouraged to start cutting their carbohydrates out and keep them less than 100 grams per day.  Patient was given examples of carbohydrates that are in food.  We also talked about the power of protein and the importance of getting more protein in per day than carbohydrates.     I also reviewed with patient the vitamins that they will need to take post op.  Patient will hear this information again at pre op class prior to surgery, but I felt it was important to prepare them now.  Patient will be taking 2 multivitamin complete per day, 100 mg of Vitamin B1, 5000 IU of Vitamin D3, 1000 mcg Vitamin B12, 1500 mg of calcium citrate.    We also spent some time talking about the post op diet protocol.  Patient is aware they will be on a liquid diet before surgery and then a week of liquids after surgery followed by 5 weeks of soft protein.  Patient's current diet habits include: Patient is eating 3 meals per day.  Meals are made up of protein and vegetables.  We have talked about eating protein first, following by vegetables.   Portion sizes are saucer size plates.   Suggestions and tips were reviewed to help decrease portion sizes.   Eating out frequency is 1-2 x a week. We talked about the importance of planning ahead, so eating out intake can be decreased.   Carbohydrate intake (including bread, rice, pasta, cereal, crackers, chips, etc.) is: NOne.  I have talked to patient about the importance of filling up on protein first, which will help with satiety.  Patient is snacking 2 times a day on mal[place] pepperoni, yogurt, fruit, grain bars.  We talked about snacks that would be more protein-based. Patient's sweet intake is:  Slice of pie on Thanksgiving.   Fluid intake is:  64 ounces of water, 0 ounces of sweet tea, 0 ounces of soda, 0 ounces of fruit juices, 0 ounces of caffeine.            Physical Activity/Exercise    Comments:     Currently for exercise, patient is doing a regular  routine of cardio.  We talked about activities for patient to do, including walking, swimming, or chair exercises.  Goals have been set.      Behavior Modification       Comments:  Emphasized the importance of eating slowly, not eating and drinking meals at the same time.   I have also encouraged patient to work on Landscape architect  We talked about ways they can track their daily intake.    Goals that patient set for next month include:  She states she wants to do a no meat diet.    Jacqueline Browning, MS RD  02/13/2020

## 2020-03-13 NOTE — L&D Delivery Note (Signed)
OB/GYN Faculty Practice Delivery Note  Thayer Inabinet is a 25 y.o. G2P1011 s/p SVD at [redacted]w[redacted]d. She was admitted for IOL due to cHTN.   ROM: 22h 20m with clear fluid GBS Status: Negative  Delivery Date/Time: 01/25/21 at 0416  Delivery: Called to room and patient was complete and pushing. Head delivered ROA. No nuchal cord present. Shoulder and body delivered in usual fashion. Infant with spontaneous cry, placed on mother's abdomen, dried and stimulated. Cord clamped x 2 after 1-minute delay, and cut by FOB under my direct supervision. Cord blood drawn. Placenta delivered spontaneously with gentle cord traction. Fundus firm with massage and Pitocin. Labia, perineum, vagina, and cervix were inspected, and no lacerations were noted.   Placenta: Intact; 3VC - sent to L&D Complications: None Lacerations: None EBL: 325 cc Analgesia: Epidural  Infant: Viable female  APGARs 11 and 30  Evalina Field, MD OB/GYN Fellow, Faculty Practice

## 2020-03-19 ENCOUNTER — Inpatient Hospital Stay: Admit: 2020-03-19 | Payer: BLUE CROSS/BLUE SHIELD | Primary: Family Medicine

## 2020-03-19 NOTE — Progress Notes (Signed)
 [facility]  [address] [facility] [address], [address]    Patient's Name: Stacy Green   Age: 25 y.o.  Date of Birth: 11-18-1995   Sex: female    Date:   03/19/2020        Session: 7 of  12  Revision:     Surgeon:  Dr. Jacques    Height: 5 f8   Weight:    329      Lbs.     BMI:    Pounds Lost since last month: 0                 Pounds Gained since last month: 0    Starting Weight: 338     Previous Month's Weight: 329  Overall Pounds Lost: 9   Overall Pounds Gained: 0    Do you smoke?  None    Alcohol intake:  Number of drinks at a time:  None  Number of times a week: None    Class Guidelines    Guidelines are reviewed with patient at the start of every class.    1. Patient understands that weight loss trial classes must be consecutive.  Patient understands if they miss a class, it is their responsibility to contact me to reschedule class.  I will reach out to patient after their first no show.  2.  Patient understands the expectations that weight maintenance/weight loss is expected during the classes.  Failure to demonstrate changes may result in one extra month of weight loss trial, followed by going back to see the surgeon.    3. Patient is also instructed to be doing their labs, blood work, psych visit, support group and any other test that the surgeon has used while they are working on their weight loss trial.    Other Pertinent Information:     Patient has attended a support group meeting.     Changes Made Since Last Class:     Eating Habits and Behaviors      Today we reviewed key diet principles.   We talked about snack ideas that would focus more on protein.  We also talked about the benefits of filling up on protein first and keeping the daily carbohydrate intake to less than 75 grams per day.  Patient was instructed to increase fluid intake to 64 ounces per day and stop all carbonation, caffeine, and sugary drinks.  During class, we talked about  the importance of getting on a routine of eating 3 meals a day, eating within one hour of waking up, and not going longer than 4 hours without fueling the body again.    I also talked with patient about some meal ideas.      Patient's current diet habits include: Patient is eating 3 meals per day.  Patient states meals are normally made up of more protein and vegetables.  We have talked about eating protein first, following by vegetables. Patient is encouraged to start eliminating carbohydrates and try to keep less than 50 grams per day.  Examples were reviewed.    Portion sizes are standard size.   Suggestions and tips were reviewed to help decrease portion sizes.   Eating out frequency is 1-2 salad and grilled nuggets. We talked about the importance of planning ahead, so eating out intake can be decreased. Discussed with patient if they need to eat out, healthier options to choose from.    Carbohydrate  intake (including bread, rice, pasta, cereal, crackers, chips, etc.) is: pasta.  I have talked to patient about the importance of filling up on protein first, which will help with satiety.  Patient is snacking 2 times a day on granola bars, mal[place] pepperoni.  We talked about snacks that would be more protein-based. Patient's sweet intake is:  Keto ice cream.   Fluid intake is:  64 ounces of water, 0 ounces of soda, 0 ounces of sweet tea, 0 ounces of fruit juices, 0 ounces of caffeine.  Patient is encouraged to stick to non-caloric drinks only.        Physical Activity/Exercise    Comments:     Currently for exercise, patient is normal walking routine.  We talked about activities for patient to do, including walking, swimming, or chair exercises.  Goals have been set.     Behavior Modification       Comments:   During class, I reviewed a power point with patients called, Assessing Your Readiness to Change.  During this power point, patient was asked to self-evaluate themselves.  At the end, we tallied the scores to  determine how ready they are to make changes for the surgery.  For the New Year's, I had patient set New Year's resolutions, including a food-related goal, exercise-related goal, and behavior goal.  Patient was encouraged to track the goals on a daily basis using the check off list I provided.  Goals should be SMART, specific, measurable, attainable, realistic, and time-orientated.     Patient's Goals are:  1. Behavior-Related Goal: No eating after 7 pm.     2. Food-related goal: Put herself on a calorie restricted diet    3. Exercise-related goal: Increase routine to Monday-Friday      Arlyne Schlossman, MS RD  03/19/2020

## 2020-04-16 ENCOUNTER — Inpatient Hospital Stay: Admit: 2020-04-16 | Payer: BLUE CROSS/BLUE SHIELD | Primary: Family Medicine

## 2020-04-16 NOTE — Progress Notes (Signed)
 [facility] [place] [facility]  [address] [facility] [address], [address]    Patient's Name: Stacy Green   Age: 25 y.o.  Date of Birth: [birth year]   Sex: female    Date:   04/16/2020              Session: 8 of  12  Revision:     Surgeon:  Dr Jacques    Height: 5 f 8   Weight:    329      Lbs.     BMI:    Pounds Lost since last month: 0                 Pounds Gained since last month: 0    Starting Weight: 338     Previous Month's Weight: 329  Overall Pounds Lost: 9   Overall Pounds Gained: 0    Do you smoke?  None    Alcohol intake:  Number of drinks at a time:  On occasion during events  Number of times a week:     Class Guidelines    Guidelines are reviewed with patient at the start of every class.    1. Patient understands that weight loss trial classes must be consecutive.  Patient understands if they miss a class, it is their responsibility to contact me to reschedule class.  I will reach out to patient after their first no show.  2.  Patient understands the expectations that weight maintenance/weight loss is expected during the classes.  Failure to demonstrate changes may result in one extra month of weight loss trial, followed by going back to see the surgeon.    3. Patient is also instructed to be doing their labs, blood work, psych visit, support group and any other test that the surgeon has used while they are working on their weight loss trial.   Patient understands that they CAN NOT gain any weight during the weight loss trial.  Gaining weight will result in extra classes    Other Pertinent Information:     Changes Made Since Last Class: Starting traditions    Eating Habits and Behaviors      Today we started off class talking about the Key Diet Principles.  Patient was encouraged to start drinking 64 ounces of fluid per day.  Patient was encouraged to start cutting out soda, caffeine, carbonation, sweet tea, fruit juice, and fruit smoothies. Patient was also  instructed to fill up on meat, fish, vegetables, eggs, cheese, and some fruit.  We also talked about protein drinks and patient was encouraged to start trying these, using them either for a meal replacement or a substitute for a current meal, which may be higher in carbohydrates.   We talked about ways to lower carbohydrates and start trying substitutes, such as zucchini noodles and cauliflower rice.        Patient's current diet habits include: Patient is eating 2-3 meals per day.  Patient states meals are normally made up of mixture of protein, vegetables, and carbohydrates.  We have talked about eating protein first, following by vegetables. Patient is encouraged to start eliminating carbohydrates and try to keep less than 50 grams per day.  Examples were reviewed.    Portion sizes are standard size plate.   Suggestions and tips were reviewed to help decrease portion sizes.   Eating out frequency is 2-3 x a week. We talked about the importance of planning ahead,  so eating out intake can be decreased. Discussed with patient if they need to eat out, healthier options to choose from.    Carbohydrate intake (including bread, rice, pasta, cereal, crackers, chips, etc.) is: All the time.  I have talked to patient about the importance of filling up on protein first, which will help with satiety.  Patient is snacking 0 times a day on chips.  We talked about snacks that would be more protein-based. Patient's sweet intake is:  0.   Fluid intake is:  0 ounces of water, 0 ounces of soda, 2-3 x  sweet tea, 2-3 x a of fruit juices, 0 ounces of caffeine.  Patient is encouraged to stick to non-caloric drinks only.        Physical Activity/Exercise    Comments:     Currently for exercise, patient is doing some cardio.  We talked about activities for patient to do, including walking, swimming, or chair exercises.  I also talked with patient about doing some strength training, which helps the metabolism, as well.  Goals have been  set.    Behavior Modification       Comments:   I also gave a power point on Behavior Changes and Weight Loss.  Some of the suggestions in the power point included food journaling.  Patient was also given some strategies to follow, such as cooking just enough for the meal and not putting serving bowls on the table.  Patient was also encouraged to restrict where they are eating to 1-2 locations to avoid mindless eating throughout the day.  Patient was given a check off list and encouraged to set 3 goals for the month.  I have really stressed to patient the importance of food journaling.  We talked about the accountability that this provides.  Patient is currently not doing food journaling.  Patient is eating meals:  In bed and couch.    One goal that patient has set for next month is:  Start food journal      Jacqueline Browning, MS RD  04/16/2020

## 2020-05-21 ENCOUNTER — Inpatient Hospital Stay: Admit: 2020-05-21 | Payer: BLUE CROSS/BLUE SHIELD | Primary: Family Medicine

## 2020-05-21 NOTE — Progress Notes (Signed)
Progress Notes by Illene Bolus, RD at 05/21/20 1456                Author: Illene Bolus, RD  Service: --  Author Type: Registered Dietitian       Filed: 05/21/20 1500  Encounter Date: 05/21/2020  Status: Signed          Editor: Illene Bolus, RD (Registered Dietitian)                  [facility]   [address] [facility] [address], [address]      Patient's Name: Stacy Green    Age: 25 y.o.   Date of Birth: 1995/08/25    Sex: female       Insurance:            Session: 9 of  12   Revision:        Height: 5 f 8 Weight:    325       Lbs.   BMI: 49.5    Pounds Lost since last month: 4               Pounds Gained since last month: 0         Starting Weight: 338   Previous Months Weight: 329   Overall Pounds Lost: 13 Overall Pounds Gained:  0         Do you smoke?  None      Alcohol intake:  Number of drinks at a time:  None   Number of times a week: None      Patient has attended a support group.  Information was provided.       Class Guidelines      Guidelines are reviewed with patient at the start of every class.      1. Patient understands that weight loss trial classes must be consecutive.  Patient understands if they miss a class, it is their responsibility to contact me to reschedule class.  I will reach  out to patient after their first no show.   2.  Patient understands the expectations that weight maintenance/weight loss is expected during the classes.  Failure to demonstrate changes may result in one extra month of weight loss trial, followed by going back to see the surgeon.   Patient understands that they CAN NOT gain any weight during the weight loss trial.  Gaining weight will result in extra classes.   3. Patient is also instructed to be doing their labs, blood work, psych visit, support group and any other test that the surgeon has used while they are working on their weight loss trial.   4.  Patient was  instructed to bring their blue binder to every class and appointment.        Other Pertinent Information:       Changes Made Since Last Class: managing caloric intake.  Tracking meals on My Fitness Pal      Eating Habits and Behaviors      Today in class, we reviewed the key diet principles.  I have talked to patient about pushing the fluid and working towards 64 ounces per day.    Patient was given ideas of liquids that would be okay.  Patient was encouraged to cut out liquid calories,  such as soda and sweet tea.  We talked about the reasons that sugar sweetened beverages can promote weight gain.  Sugar is highly palatable.  Excessive consumption of sugar can trigger an exaggerated release of dopamine, which can promote a compulsive  drive to consume more sugar sweetened beverages.  Also, satiety is not reached with liquid calories the same way it does with solid calories.      In class, we also talked about focusing on protein and low carbohydrates.  Patient was encouraged during the weight loss trial to keep their carbohydrate less than 75 grams per day and their protein level at 60-80 grams per day.  We talked about meal  choices and snack ideas.        Patient was given a packet on carbohydrate substitutions and recipe exchanges.  This will allow them to still have some of the foods they enjoy, but a lower carbohydrate alternative.       Patient's current diet habits include: Patient is eating 3 meals per day.  Patient is eating Premier shake, boiled egg, [place] sausage, meat,  and vegetables, grilled chicken  at meals.  Patient states their portions are small.  Patient is measuring portions out.  I have recommended patient to use a smaller plate and to drink water prior to their meal to help fill them up.  We talked about eating  and drinking post op and stopping 30 minutes before and after.  Patient indicates they are eating out, not often, but states if she does, it is grilled nuggets.  This includes fast  food, carry out, and sit down restaurants.  I have talked to patient about  starting to plan ahead to cut down on eating out.  Patient indicates their indicate of bread, rice, pasta, crackers to be 0 times a week.  Patient is snacking 2 times a day on [place] pepperoni, veggie chips, yogurt, light popcorn.  Sweet intake is 2 x  a month.  I have talked to patient about the importance of focusing on protein at meals.  We talked about trying to limit carbohydrates.   Patient is drinking 64 ounces of water, 8 ounces soda, 0 ounces of sweet tea, 0 ounces of fruit juices, and 0 ounces  of caffeine.  Patient was encouraged to aim for 64 ounces daily.  I talked about focusing on zero calorie liquids and not drinking calories and weaning from caffeine.               Physical Activity/Exercise      Comments:    We talked about exercise.  Patient was given reasons of why exercise is so important and how that can help with their long-term success.  I have encouraged patient to get a support system  to help with the activity.      Patient is currently going to the gym or doing Donnamarie Poag work out on [employer] for activity.      Patient's plan to incorporate more activity includes:          Behavior Modification          Comments:  During today's lesson, I also spent some time talking about behavior changes.  I talked to patient about the importance of taking vitamins post op and we reviewed the vitamins that patients  will be taking post op.  Patient will hear this again at pre op class before surgery.  Patient had the opportunity to ask questions about these vitamins that will be lifelong.         Goals that patient wants to work on includes:  1. Patient would like to do the liquid diet for a few days to prepare   2. Eat a meal without a drink to prepare her         Adela Ports, MS RD   May 14, 2020

## 2020-06-11 ENCOUNTER — Inpatient Hospital Stay: Admit: 2020-06-11 | Payer: BLUE CROSS/BLUE SHIELD | Primary: Family Medicine

## 2020-06-11 NOTE — Progress Notes (Signed)
 [facility]  [address] [facility] [address], [address]    Patient's Name: Stacy Green   Age: 25 y.o.  Date of Birth: 1995-03-15   Sex: female    Date:   06/11/2020    Insurance:              Session: 10 of  12  Surgeon:  Dr. Jacques    Height: 5 f 8   Weight:    320      Lbs.     BMI:    Pounds Lost since last month: 5                 Pounds Gained since last month: 0    Starting Weight: 338     Previous Month's Weight: 325  Overall Pounds Lost: 18   Overall Pounds Gained: 0    Patient has been to a support group meeting.    Do you smoke?  None    Alcohol intake:  Number of drinks at a time:  Socially, but none this month  Number of times a week:     Class Guidelines    Guidelines are reviewed with patient at the start of every class.    1. Patient understands that weight loss trial classes must be consecutive.  Patient understands if they miss a class, it is their responsibility to contact me to reschedule class.  I will reach out to patient after their first no show.  2.  Patient understands the expectations that weight maintenance/weight loss is expected during the classes.  Failure to demonstrate changes may result in one extra month of weight loss trial, followed by going back to see the surgeon.  Patient understands that they CAN NOT gain any weight during the weight loss trial.  Gaining weight will result in extra classes.  3. Patient is also instructed to be doing their labs, blood work, psych visit, support group and any other test that the surgeon has used while they are working on their weight loss trial.  4.  Patient was instructed to bring their blue binder to every class and appointment.      Other Pertinent Information:     Changes Made Since Last Class:     Eating Habits and Behaviors      We started off class today by reviewing key diet principles.  Patient was given a very specific list of foods that they can eat, which included meat,  fish, vegetables, eggs, cheese, fats, soy, and berries.  Patient was also given a list of foods that should be avoided.  These included sweets (candy, soda, baked goods, ice cream), and starches, including pasta, rice, crackers, chips, oatmeal, bread.  We talked about appropriate protein-based snacks, including deli meat, low fat cheese, yogurt, hummus, small handful of almonds.  We talked about working on sipping fluid throughout the day and working towards 64 ounces.  I have recommended water, Crystal light, sugar free drinks, but eliminating soda, sweet tea, and fruit juices.      I also gave a power point on ways to help with the metabolism.  Some ways that can help boost one's metabolism include healthy snacking.  Eating 6 small meals in the pre op phase can help keep the metabolism revved up.  It is recommended to focus on protein-based snacks.   Exercise is another way to increase resting metabolic rate.  The more lean muscle one  has, the more calories they will burn at rest.  Dehydration can slow down one's metabolism, as well.  Patient is encouraged to keep sipping to work towards 64 ounces of fluid per day.  Protein is another booster for metabolism.  Foods high in carbohydrates and fat slow down the metabolism.      Patient's current diet habits include: Patient is eating 3 meals a day.  Patient is eating boiled eggs, fruit, spinach, omelette at meals.  Patient states their portion sizes are saucer size plate.  I have encouraged patient to use a smaller plate and fill up on water before meals to help cut down on portions.  Patient is eating fast food: 2 x a week.  Carry out intake is: 0.  Sit down restaurant intake is: 1 x every 2 weeks, but is monitoring her calorie intake.   Patient's is eating bread, rice, pasta, cereal, and other carbohydrates , not often.  Using cauliflower rice with her tuna.    Patient is snacking on [place] pepperoni, yogurt, light popcorn, veggie sticks.  This is being done 1-2  times a week.  Patient's sweet intake is minimal, may have keto ice cream.  I have talked about the importance of getting into the habit now of cutting the sweets out.      Fluid intake:  64 ounces of water, crystal light, unsweetened tea.  0 ounces of soda, 0 ounces of sweet tea, 0 ounces of fruit juice, 0 ounces of caffeine.  Patient is encouraged not to drink calories and stick to non-carbonated, non-caffeine, sugar free drinks.  The goal is 64 ounces of fluid per day.      Physical Activity/Exercise    Comments:     Currently for exercise, patient is doing a normal activity routine.  We talked about activities for patient to do, including walking, swimming, or chair exercises.  I also talked with patient about doing some strength training, which helps the metabolism, as well.  Patient understands the importance of establishing an activity routine and that surgery is only a small piece of puzzle, but exercise and diet changes will play a role in long term success.    Behavior Modification       Comments:   In the power point, Mastering Your Metabolism, I also gave behaviors that can help with one's metabolism.  These include not skipping meals and being sure to feed and fuel that body rather than going large gaps and putting the body in starvation mode.  Patient is encouraged to eat within 1 hour of waking up and have a cut off time in the evening.  We talked about night time syndrome where a person may skip breakfast and lunch and then consume the majority of their calories from dinner until bed time.  I  have talked about how important it it to get 3 meals in per day, eat breakfast, and BREAK THE FAST.    One goal for next month includes:  1.  States she is working on doing better with not eating and drinking at the same time.  2. Eat slower.      Stacy Browning, MS RD  06/11/2020

## 2020-06-16 ENCOUNTER — Other Ambulatory Visit: Payer: Self-pay

## 2020-06-16 ENCOUNTER — Inpatient Hospital Stay (HOSPITAL_COMMUNITY): Payer: BC Managed Care – PPO

## 2020-06-16 ENCOUNTER — Inpatient Hospital Stay (HOSPITAL_COMMUNITY)
Admission: AD | Admit: 2020-06-16 | Discharge: 2020-06-16 | Disposition: A | Discharge status: Home / Self Care | Payer: BC Managed Care – PPO | Attending: Obstetrics and Gynecology | Admitting: Obstetrics and Gynecology

## 2020-06-16 ENCOUNTER — Encounter (HOSPITAL_COMMUNITY): Payer: Self-pay | Admitting: Obstetrics and Gynecology

## 2020-06-16 DIAGNOSIS — R109 Unspecified abdominal pain: Secondary | ICD-10-CM | POA: Insufficient documentation

## 2020-06-16 DIAGNOSIS — O26891 Other specified pregnancy related conditions, first trimester: Secondary | ICD-10-CM | POA: Diagnosis not present

## 2020-06-16 DIAGNOSIS — O210 Mild hyperemesis gravidarum: Secondary | ICD-10-CM | POA: Insufficient documentation

## 2020-06-16 DIAGNOSIS — Z3A01 Less than 8 weeks gestation of pregnancy: Secondary | ICD-10-CM | POA: Diagnosis not present

## 2020-06-16 DIAGNOSIS — B9689 Other specified bacterial agents as the cause of diseases classified elsewhere: Secondary | ICD-10-CM

## 2020-06-16 DIAGNOSIS — O219 Vomiting of pregnancy, unspecified: Secondary | ICD-10-CM | POA: Diagnosis not present

## 2020-06-16 DIAGNOSIS — O23591 Infection of other part of genital tract in pregnancy, first trimester: Secondary | ICD-10-CM | POA: Diagnosis not present

## 2020-06-16 DIAGNOSIS — O26899 Other specified pregnancy related conditions, unspecified trimester: Secondary | ICD-10-CM

## 2020-06-16 DIAGNOSIS — R102 Pelvic and perineal pain: Secondary | ICD-10-CM | POA: Diagnosis present

## 2020-06-16 DIAGNOSIS — Z3490 Encounter for supervision of normal pregnancy, unspecified, unspecified trimester: Secondary | ICD-10-CM | POA: Diagnosis present

## 2020-06-16 DIAGNOSIS — O10011 Pre-existing essential hypertension complicating pregnancy, first trimester: Secondary | ICD-10-CM | POA: Diagnosis not present

## 2020-06-16 LAB — URINALYSIS, ROUTINE W REFLEX MICROSCOPIC
Bacteria, UA: NONE SEEN
Bilirubin Urine: NEGATIVE
Glucose, UA: NEGATIVE mg/dL
Hgb urine dipstick: NEGATIVE
Ketones, ur: NEGATIVE mg/dL
Nitrite: NEGATIVE
Protein, ur: NEGATIVE mg/dL
Specific Gravity, Urine: 1.02 (ref 1.005–1.030)
pH: 5 (ref 5.0–8.0)

## 2020-06-16 LAB — WET PREP, GENITAL
Sperm: NONE SEEN
Trich, Wet Prep: NONE SEEN
Yeast Wet Prep HPF POC: NONE SEEN

## 2020-06-16 LAB — CBC
HCT: 35.4 % — ABNORMAL LOW (ref 36.0–46.0)
Hemoglobin: 11 g/dL — ABNORMAL LOW (ref 12.0–15.0)
MCH: 25.6 pg — ABNORMAL LOW (ref 26.0–34.0)
MCHC: 31.1 g/dL (ref 30.0–36.0)
MCV: 82.5 fL (ref 80.0–100.0)
Platelets: 390 10*3/uL (ref 150–400)
RBC: 4.29 MIL/uL (ref 3.87–5.11)
RDW: 15.5 % (ref 11.5–15.5)
WBC: 11.4 10*3/uL — ABNORMAL HIGH (ref 4.0–10.5)
nRBC: 0 % (ref 0.0–0.2)

## 2020-06-16 LAB — GC/CHLAMYDIA PROBE AMP (~~LOC~~) NOT AT ARMC
Chlamydia: NEGATIVE
Comment: NEGATIVE
Comment: NORMAL
Neisseria Gonorrhea: NEGATIVE

## 2020-06-16 LAB — I-STAT BETA HCG BLOOD, ED (MC, WL, AP ONLY): I-stat hCG, quantitative: 1570.2 m[IU]/mL — ABNORMAL HIGH (ref <5)

## 2020-06-16 LAB — ABO/RH: ABO/RH(D): O POS

## 2020-06-16 MED ORDER — NIFEDIPINE ER OSMOTIC RELEASE 30 MG PO TB24
30.0000 mg | ORAL_TABLET | Freq: Once | ORAL | Status: AC
  Administered 2020-06-16: 30 mg via ORAL
  Filled 2020-06-16: qty 1

## 2020-06-16 MED ORDER — METRONIDAZOLE 500 MG PO TABS
500.0000 mg | ORAL_TABLET | Freq: Two times a day (BID) | ORAL | 0 refills | Status: DC
Start: 2020-06-16 — End: 2020-07-20

## 2020-06-16 MED ORDER — ACETAMINOPHEN 500 MG PO TABS
1000.0000 mg | ORAL_TABLET | Freq: Once | ORAL | Status: AC
  Administered 2020-06-16: 1000 mg via ORAL
  Filled 2020-06-16: qty 2

## 2020-06-16 MED ORDER — METOCLOPRAMIDE HCL 10 MG PO TABS
10.0000 mg | ORAL_TABLET | Freq: Once | ORAL | Status: AC
  Administered 2020-06-16: 10 mg via ORAL
  Filled 2020-06-16: qty 1

## 2020-06-16 MED ORDER — PREPLUS 27-1 MG PO TABS: 1.0000 | ORAL_TABLET | Freq: Every day | ORAL | 13 refills | Status: DC

## 2020-06-16 MED ORDER — NIFEDIPINE ER OSMOTIC RELEASE 30 MG PO TB24
30.0000 mg | ORAL_TABLET | Freq: Every day | ORAL | 2 refills | Status: DC
Start: 2020-06-16 — End: 2020-12-07

## 2020-06-16 NOTE — MAU Provider Note (Signed)
History     CSN: [id number]  Arrival date and time: 06/16/20 0349   Event Date/Time   First Provider Initiated Contact with Patient 06/16/20 0547      Chief Complaint  Patient presents with  . Abdominal Pain  . Nausea   Theresa Patterson is a 25 y.o. G2P0010 at [redacted]w[redacted]d by Definite LMP of May 09, 2020.  She presents today for Abdominal Pain and Nausea.  She states she has been experiencing cramping for the past 2 weeks, but it has worsened in the past 3 days.  Patient describes the pain as intermittent cramping that is lcoated in her lower abdominal area and back.  She rates the pain a 9/10 and states it is improved with tylenol usage.  She has not identified any aggravating factors.  She denies vaginal discharge and endorses some bleeding 3 days ago, but none currently.  She denies issues with urination, constipation, or diarrhea. Patient reports she takes Toprol for her CHTN and took her dosage yesterday around 0900.     OB History    Gravida  2   Para      Term      Preterm      AB  1   Living        SAB  1   IAB      Ectopic      Multiple      Live Births              Past Medical History:  Diagnosis Date  . Hypertension   . Polycystic ovary syndrome     Past Surgical History:  Procedure Laterality Date  . NO PAST SURGERIES      History reviewed. No pertinent family history.  Social History   Tobacco Use  . Smoking status: Never Smoker  . Smokeless tobacco: Never Used  Vaping Use  . Vaping Use: Never used  Substance Use Topics  . Alcohol use: Yes    Comment: occasionally  . Drug use: Not Currently    Allergies: No Known Allergies  Medications Prior to Admission  Medication Sig Dispense Refill Last Dose  . metoprolol succinate (TOPROL-XL) 25 MG 24 hr tablet Take 25 mg by mouth daily.   06/15/2020 at Unknown time  . omeprazole (PRILOSEC) 40 MG capsule Take 40 mg by mouth daily.   06/15/2020 at Unknown time    Review of Systems  Constitutional:  Negative for chills and fever.  Gastrointestinal: Positive for abdominal pain and nausea. Negative for vomiting.  Genitourinary: Positive for vaginal bleeding ( x1-3 nights ago with wiping). Negative for difficulty urinating, dyspareunia, dysuria and vaginal discharge.  Musculoskeletal: Negative for back pain.  Neurological: Negative for dizziness, light-headedness and headaches.   Physical Exam   Blood pressure (!) 159/85, pulse 93, temperature 98.2 F (36.8 C), temperature source Oral, resp. rate 16, height 5\' 8"  (1.727 m), weight (!) 148.3 kg, last menstrual period 05/09/2020, SpO2 100 %.  Physical Exam Vitals reviewed. Exam conducted with a chaperone present.  Constitutional:      Appearance: She is well-developed.  HENT:     Head: Normocephalic and atraumatic.  Pulmonary:     Effort: Pulmonary effort is normal.     Breath sounds: Normal breath sounds.  Genitourinary:    Labia:        Right: No tenderness or lesion.        Left: No tenderness or lesion.      Vagina: Vaginal discharge present. No bleeding.  Cervix: No cervical motion tenderness, discharge or cervical bleeding.     Uterus: Not enlarged.      Comments: Speculum Exam: -Normal External Genitalia: Non tender, no apparent discharge at introitus.  -Vaginal Vault: Pink mucosa with good rugae. Moderate amt frothy gray discharge -wet prep collected -Cervix:Pink, no lesions, cysts, or polyps. Ecotropian. Appears closed. No active bleeding from os-GC/CT collected -Bimanual Exam:  No CMT.  Uterine size not assess d/t panus.   Skin:    General: Skin is warm and dry.  Neurological:     Mental Status: She is alert and oriented to person, place, and time.  Psychiatric:        Mood and Affect: Mood normal.        Behavior: Behavior normal.     MAU Course  Procedures Results for orders placed or performed during the hospital encounter of 06/16/20 (from the past 24 hour(s))  I-Stat beta hCG blood, ED     Status:  Abnormal   Collection Time: 06/16/20  4:24 AM  Result Value Ref Range   I-stat hCG, quantitative 1,570.2 (H) <5 mIU/mL   Comment 3          Urinalysis, Routine w reflex microscopic     Status: Abnormal   Collection Time: 06/16/20  5:09 AM  Result Value Ref Range   Color, Urine YELLOW YELLOW   APPearance HAZY (A) CLEAR   Specific Gravity, Urine 1.020 1.005 - 1.030   pH 5.0 5.0 - 8.0   Glucose, UA NEGATIVE NEGATIVE mg/dL   Hgb urine dipstick NEGATIVE NEGATIVE   Bilirubin Urine NEGATIVE NEGATIVE   Ketones, ur NEGATIVE NEGATIVE mg/dL   Protein, ur NEGATIVE NEGATIVE mg/dL   Nitrite NEGATIVE NEGATIVE   Leukocytes,Ua SMALL (A) NEGATIVE   RBC / HPF 0-5 0 - 5 RBC/hpf   WBC, UA 0-5 0 - 5 WBC/hpf   Bacteria, UA NONE SEEN NONE SEEN   Squamous Epithelial / LPF 11-20 0 - 5   Mucus PRESENT   CBC     Status: Abnormal   Collection Time: 06/16/20  5:40 AM  Result Value Ref Range   WBC 11.4 (H) 4.0 - 10.5 K/uL   RBC 4.29 3.87 - 5.11 MIL/uL   Hemoglobin 11.0 (L) 12.0 - 15.0 g/dL   HCT 16.1 (L) 09.6 - 04.5 %   MCV 82.5 80.0 - 100.0 fL   MCH 25.6 (L) 26.0 - 34.0 pg   MCHC 31.1 30.0 - 36.0 g/dL   RDW 40.9 81.1 - 91.4 %   Platelets 390 150 - 400 K/uL   nRBC 0.0 0.0 - 0.2 %  ABO/Rh     Status: None   Collection Time: 06/16/20  5:40 AM  Result Value Ref Range   ABO/RH(D) O POS    No rh immune globuloin      NOT A RH IMMUNE GLOBULIN CANDIDATE, PT RH POSITIVE Performed at [facility], [address], [place], [place] [address]   Wet prep, genital     Status: Abnormal   Collection Time: 06/16/20  6:01 AM   Specimen: Vaginal  Result Value Ref Range   Yeast Wet Prep HPF POC NONE SEEN NONE SEEN   Trich, Wet Prep NONE SEEN NONE SEEN   Clue Cells Wet Prep HPF POC PRESENT (A) NONE SEEN   WBC, Wet Prep HPF POC MANY (A) NONE SEEN   Sperm NONE SEEN    US OB LESS THAN 14 WEEKS WITH OB TRANSVAGINAL  Result Date: 06/16/2020  CLINICAL DATA:  Abdominal pain.  Spotting. EXAM: OBSTETRIC <14 WK [place]  AND TRANSVAGINAL OB US TECHNIQUE: Both transabdominal and transvaginal ultrasound examinations were performed for complete evaluation of the gestation as well as the maternal uterus, adnexal regions, and pelvic cul-de-sac. Transvaginal technique was performed to assess early pregnancy. COMPARISON:  No prior. FINDINGS: Intrauterine gestational sac: Single. Yolk sac:  Not visualized Embryo:  Not visualized. Cardiac Activity: Not visualized. MSD: 4.6 mm   5 w   1 d Subchorionic hemorrhage:  None visualized. Maternal uterus/adnexae: Unremarkable. IMPRESSION: Probable tiny intrauterine gestational sac corresponding to 5 weeks 1 day pregnancy. No yolk sac, fetal pole, or cardiac activity yet visualized. Recommend follow-up quantitative B-HCG levels and follow-up US in 14 days to assess viability. This recommendation follows SRU consensus guidelines: Diagnostic Criteria for Nonviable Pregnancy Early in the First Trimester. Malva Limes Med 2013; 563:8756-43. Electronically Signed   By: Maisie Fus  Register   On: 06/16/2020 06:24    MDM Pelvic Exam; Wet Prep and GC/CT Labs: UA, CBC,  ABO Ultrasound Analgesic Prescription Assessment and Plan  25 year old  G2P0010 at 5.3 weeks Abdominal Cramping Nausea  -POC Reviewed. -Labs collected. -Exam performed and findings discussed. -Cultures collected and pending.  -Will give tylenol for pain.  -Will send for [place] and await results. -Cautioned that with early pregnancy it is common for ultrasound to be inconclusive. -Patient verbalized understanding.   Cherre Robins 06/16/2020, 5:47 AM   Reassessment (6:37 AM) IUGS  Bacterial vaginosis CHTN  -Results as above -Provider to bedside to discuss. -Patient informed of need to follow up in 48 hrs for repeat hCG. -Appt scheduled at [facility] for Friday April 8 at 0900.  -Discussed BV diagnosis and treatment. -Informed of need to discontinue metoprolol succinate.  Will start on Procardia 30xl. Initial dose given  today.  -Rx for PNV sent to pharmacy on file. Bleeding precautions discussed. -Encouraged to call or return to MAU if symptoms worsen or with the onset of new symptoms. -Discharged to home in stable condition.  Cherre Robins MSN, CNM Advanced Practice Provider, [facility] [employer]

## 2020-06-16 NOTE — ED Notes (Signed)
Called transport 

## 2020-06-16 NOTE — MAU Note (Signed)
Pt reports lower abd pain and lower back pain for several weeks. Positive home preg test over the weekend. Had some spotting 2 days ago. Tonight the cramping worsened and she started having nausea.

## 2020-06-16 NOTE — ED Triage Notes (Signed)
Lower abdominal cramping started last night and nausea. Pt reports being pregnant thru home test. Denies any vaginal bleeding.

## 2020-06-16 NOTE — Discharge Instructions (Signed)
Bacterial Vaginosis  Bacterial vaginosis is an infection that occurs when the normal balance of bacteria in the vagina changes. This change is caused by an overgrowth of certain bacteria in the vagina. Bacterial vaginosis is the most common vaginal infection among females aged 26 to 44 years. This condition increases the risk of sexually transmitted infections (STIs). Treatment can help reduce this risk. Treatment is very important for pregnant women because this condition can cause babies to be born early (prematurely) or at a low birth weight. What are the causes? This condition is caused by an increase in harmful bacteria that are normally present in small amounts in the vagina. However, the exact reason this condition develops is not known. You cannot get bacterial vaginosis from toilet seats, bedding, swimming pools, or contact with objects around you. What increases the risk? The following factors may make you more likely to develop this condition:  Having a new sexual partner or multiple sexual partners, or having unprotected sex.  Douching.  Having an intrauterine device (IUD).  Smoking.  Abusing drugs and alcohol. This may lead to riskier sexual behavior.  Taking certain antibiotic medicines.  Being pregnant. What are the signs or symptoms? Some women with this condition have no symptoms. Symptoms may include:  Gray or white vaginal discharge. The discharge can be watery or foamy.  A fish-like odor with discharge, especially after sex or during menstruation.  Itching in and around the vagina.  Burning or pain with urination. How is this diagnosed? This condition is diagnosed based on:  Your medical history.  A physical exam of the vagina.  Checking a sample of vaginal fluid for harmful bacteria or abnormal cells. How is this treated? This condition is treated with antibiotic medicines. These may be given as a pill, a vaginal cream, or a medicine that is put into the  vagina (suppository). If the condition comes back after treatment, a second round of antibiotics may be needed. Follow these instructions at home: Medicines  Take or apply over-the-counter and prescription medicines only as told by your health care provider.  Take or apply your antibiotic medicine as told by your health care provider. Do not stop using the antibiotic even if you start to feel better. General instructions  If you have a female sexual partner, tell her that you have a vaginal infection. She should follow up with her health care provider. If you have a female sexual partner, he does not need treatment.  Avoid sexual activity until you finish treatment.  Drink enough fluid to keep your urine pale yellow.  Keep the area around your vagina and rectum clean. ? Wash the area daily with warm water. ? Wipe yourself from front to back after using the toilet.  If you are breastfeeding, talk to your health care provider about continuing breastfeeding during treatment.  Keep all follow-up visits. This is important. How is this prevented? Self-care  Do not douche.  Wash the outside of your vagina with warm water only.  Wear cotton or cotton-lined underwear.  Avoid wearing tight pants and pantyhose, especially during the summer. Safe sex  Use protection when having sex. This includes: ? Using condoms. ? Using dental dams. This is a thin layer of a material made of latex or polyurethane that protects the mouth during oral sex.  Limit the number of sexual partners. To help prevent bacterial vaginosis, it is best to have sex with just one partner (monogamous relationship).  Make sure you and your sexual partner   are tested for STIs. Drugs and alcohol  Do not use any products that contain nicotine or tobacco. These products include cigarettes, chewing tobacco, and vaping devices, such as e-cigarettes. If you need help quitting, ask your health care provider.  Do not use  drugs.  Do not drink alcohol if: ? Your health care provider tells you not to do this. ? You are pregnant, may be pregnant, or are planning to become pregnant.  If you drink alcohol: ? Limit how much you have to 0-1 drink a day. ? Be aware of how much alcohol is in your drink. In the [place] one drink equals one 12 oz bottle of beer (355 mL), one 5 oz glass of wine (148 mL), or one 1 oz glass of hard liquor (44 mL). Where to find more information  Centers for Disease Control and Prevention: www.cdc.gov  American Sexual Health Association (ASHA): www.ashastd.org  [place] Department of Health and Human Services, Office on Women's Health: www.womenshealth.gov Contact a health care provider if:  Your symptoms do not improve, even after treatment.  You have more discharge or pain when urinating.  You have a fever or chills.  You have pain in your abdomen or pelvis.  You have pain during sex.  You have vaginal bleeding between menstrual periods. Summary  Bacterial vaginosis is a vaginal infection that occurs when the normal balance of bacteria in the vagina changes. It results from an overgrowth of certain bacteria.  This condition increases the risk of sexually transmitted infections (STIs). Getting treated can help reduce this risk.  Treatment is very important for pregnant women because this condition can cause babies to be born early (prematurely) or at low birth weight.  This condition is treated with antibiotic medicines. These may be given as a pill, a vaginal cream, or a medicine that is put into the vagina (suppository). This information is not intended to replace advice given to you by your health care provider. Make sure you discuss any questions you have with your health care provider. Document Revised: 08/28/2019 Document Reviewed: 08/28/2019 Elsevier Patient Education  2021 Elsevier Inc.  

## 2020-06-16 NOTE — ED Provider Notes (Signed)
MSE was initiated and I personally evaluated the patient and placed orders (if any) at  4:13 AM on June 16, 2020.   Complains of lower abdominal pain x 2 weeks, "really bad" x 3 days. She had one episode vaginal bleeding 3 days ago, none since. She reports positive home pregnancy tests x 3. History of miscarriage in early pregnancy.   Today's Vitals   06/16/20 0411 06/16/20 0412  BP: (!) 160/100   Pulse: (!) 108   Resp: 16   Temp: 98.8 F (37.1 C)   TempSrc: Oral   SpO2: 99%   Weight:  (!) 150.6 kg  Height:  5\' 8"  (1.727 m)  PainSc:  5    Body mass index is 50.48 kg/m.  I-stat HCG positive here. Will discuss with [facility] APP and transfer.   The patient appears stable so that the remainder of the MSE may be completed by another provider.   , PA-C 06/16/20 08/16/20    0881, MD 06/16/20 [phone]

## 2020-06-18 ENCOUNTER — Telehealth: Payer: Self-pay

## 2020-06-18 ENCOUNTER — Other Ambulatory Visit: Payer: BC Managed Care – PPO

## 2020-06-18 ENCOUNTER — Ambulatory Visit (INDEPENDENT_AMBULATORY_CARE_PROVIDER_SITE_OTHER): Payer: BC Managed Care – PPO

## 2020-06-18 ENCOUNTER — Other Ambulatory Visit: Payer: Self-pay

## 2020-06-18 DIAGNOSIS — O3680X0 Pregnancy with inconclusive fetal viability, not applicable or unspecified: Secondary | ICD-10-CM

## 2020-06-18 LAB — BETA HCG QUANT (REF LAB): hCG Quant: 3330 m[IU]/mL

## 2020-06-18 NOTE — Progress Notes (Signed)
Agree with A & P. 

## 2020-06-18 NOTE — Progress Notes (Signed)
History   Chief Complaint:   Theresa Patterson is  25 y.o. G2P0010 Patient's last menstrual period was 05/09/2020. Patient is here for follow up of quantitative HCG and ongoing surveillance of pregnancy status.   She is [redacted]w[redacted]d weeks gestation by LMP. Since her last visit, the patient is without new complaint.     The patient reports bleeding as none now.   Labs: Her previous Quantitative HCG values are: 06/16/20 - 1,570.2 06/18/20 - 3330  Ultrasound Studies:   US OB LESS THAN 14 WEEKS WITH OB TRANSVAGINAL  Result Date: 06/16/2020 CLINICAL DATA:  Abdominal pain.  Spotting. EXAM: OBSTETRIC <14 WK [place] AND TRANSVAGINAL OB US TECHNIQUE: Both transabdominal and transvaginal ultrasound examinations were performed for complete evaluation of the gestation as well as the maternal uterus, adnexal regions, and pelvic cul-de-sac. Transvaginal technique was performed to assess early pregnancy. COMPARISON:  No prior. FINDINGS: Intrauterine gestational sac: Single. Yolk sac:  Not visualized Embryo:  Not visualized. Cardiac Activity: Not visualized. MSD: 4.6 mm   5 w   1 d Subchorionic hemorrhage:  None visualized. Maternal uterus/adnexae: Unremarkable. IMPRESSION: Probable tiny intrauterine gestational sac corresponding to 5 weeks 1 day pregnancy. No yolk sac, fetal pole, or cardiac activity yet visualized. Recommend follow-up quantitative B-HCG levels and follow-up US in 14 days to assess viability. This recommendation follows SRU consensus guidelines: Diagnostic Criteria for Nonviable Pregnancy Early in the First Trimester. Malva Limes Med 2013; 174:0814-48. Electronically Signed   By: Maisie Fus  Register   On: 06/16/2020 06:24    Assessment: [redacted]w[redacted]d weeks gestation here for ongoing surveillance of pregnancy.  Plan: We will inform pt of a plan once results are reviewed by the doctor.  Update - Quant levels are rising appropriately Repeat u/s 10-14 days

## 2020-06-18 NOTE — Telephone Encounter (Addendum)
Reviewed STAT HCG quant results with MD Informed pt HCG quant levels are rising appropriately. Bleeding precautions discussed Repeat quant 10-14 days. Appt desk notified. Pt agrees and has no further questions.

## 2020-06-24 ENCOUNTER — Other Ambulatory Visit: Payer: Self-pay

## 2020-06-24 DIAGNOSIS — O26891 Other specified pregnancy related conditions, first trimester: Secondary | ICD-10-CM | POA: Insufficient documentation

## 2020-06-24 DIAGNOSIS — R1030 Lower abdominal pain, unspecified: Secondary | ICD-10-CM | POA: Insufficient documentation

## 2020-06-24 DIAGNOSIS — M545 Low back pain, unspecified: Secondary | ICD-10-CM | POA: Insufficient documentation

## 2020-06-24 DIAGNOSIS — Z3A01 Less than 8 weeks gestation of pregnancy: Secondary | ICD-10-CM | POA: Insufficient documentation

## 2020-06-25 ENCOUNTER — Other Ambulatory Visit: Payer: Self-pay

## 2020-06-25 ENCOUNTER — Emergency Department (HOSPITAL_COMMUNITY): Payer: BC Managed Care – PPO

## 2020-06-25 ENCOUNTER — Encounter (HOSPITAL_COMMUNITY): Payer: Self-pay

## 2020-06-25 ENCOUNTER — Emergency Department (HOSPITAL_COMMUNITY)
Admission: EM | Admit: 2020-06-25 | Discharge: 2020-06-25 | Disposition: A | Discharge status: Home / Self Care | Payer: BC Managed Care – PPO | Attending: Obstetrics and Gynecology | Admitting: Obstetrics and Gynecology

## 2020-06-25 DIAGNOSIS — Z3A01 Less than 8 weeks gestation of pregnancy: Secondary | ICD-10-CM | POA: Diagnosis not present

## 2020-06-25 DIAGNOSIS — O99891 Other specified diseases and conditions complicating pregnancy: Secondary | ICD-10-CM | POA: Diagnosis not present

## 2020-06-25 DIAGNOSIS — R102 Pelvic and perineal pain: Secondary | ICD-10-CM

## 2020-06-25 DIAGNOSIS — M549 Dorsalgia, unspecified: Secondary | ICD-10-CM | POA: Diagnosis not present

## 2020-06-25 DIAGNOSIS — R109 Unspecified abdominal pain: Secondary | ICD-10-CM

## 2020-06-25 DIAGNOSIS — O26891 Other specified pregnancy related conditions, first trimester: Secondary | ICD-10-CM

## 2020-06-25 DIAGNOSIS — M545 Low back pain, unspecified: Secondary | ICD-10-CM | POA: Diagnosis present

## 2020-06-25 DIAGNOSIS — R1030 Lower abdominal pain, unspecified: Secondary | ICD-10-CM | POA: Diagnosis not present

## 2020-06-25 LAB — CBC
HCT: 36.9 % (ref 36.0–46.0)
Hemoglobin: 11.5 g/dL — ABNORMAL LOW (ref 12.0–15.0)
MCH: 25.3 pg — ABNORMAL LOW (ref 26.0–34.0)
MCHC: 31.2 g/dL (ref 30.0–36.0)
MCV: 81.3 fL (ref 80.0–100.0)
Platelets: 400 10*3/uL (ref 150–400)
RBC: 4.54 MIL/uL (ref 3.87–5.11)
RDW: 15.7 % — ABNORMAL HIGH (ref 11.5–15.5)
WBC: 9.9 10*3/uL (ref 4.0–10.5)
nRBC: 0 % (ref 0.0–0.2)

## 2020-06-25 LAB — URINALYSIS, ROUTINE W REFLEX MICROSCOPIC
Bilirubin Urine: NEGATIVE
Glucose, UA: NEGATIVE mg/dL
Ketones, ur: NEGATIVE mg/dL
Nitrite: NEGATIVE
Protein, ur: NEGATIVE mg/dL
Specific Gravity, Urine: 1.01 (ref 1.005–1.030)
pH: 7 (ref 5.0–8.0)

## 2020-06-25 LAB — HCG, QUANTITATIVE, PREGNANCY: hCG, Beta Chain, Quant, S: 35006 m[IU]/mL — ABNORMAL HIGH (ref <5)

## 2020-06-25 MED ORDER — CYCLOBENZAPRINE HCL 10 MG PO TABS: 10.0000 mg | ORAL_TABLET | Freq: Two times a day (BID) | ORAL | 0 refills | Status: DC | PRN

## 2020-06-25 MED ORDER — METOCLOPRAMIDE HCL 10 MG PO TABS
10.0000 mg | ORAL_TABLET | Freq: Once | ORAL | Status: AC
  Administered 2020-06-25: 10 mg via ORAL
  Filled 2020-06-25: qty 1

## 2020-06-25 MED ORDER — ACETAMINOPHEN 325 MG PO TABS
650.0000 mg | ORAL_TABLET | Freq: Once | ORAL | Status: AC
  Administered 2020-06-25: 650 mg via ORAL
  Filled 2020-06-25: qty 2

## 2020-06-25 NOTE — ED Provider Notes (Signed)
Continues to have low back and lower abdominal pain Seen at [facility] 4/6 for same No vaginal bleeding, or vaginal discharge Concerned because it is persistent No fever No urinary symptoms  Discussed with Byrd Hesselbach at [facility] who accepts the patient to their unit.    Elpidio Anis, PA-C 06/25/20 5615    Zadie Rhine, MD 06/25/20 [phone]

## 2020-06-25 NOTE — Discharge Instructions (Signed)
Back Pain in Pregnancy Back pain during pregnancy is common. Back pain may be caused by several factors that are related to changes during your pregnancy. Follow these instructions at home: Managing pain, stiffness, and swelling  If directed, for sudden (acute) back pain, put ice on the painful area. ? Put ice in a plastic bag. ? Place a towel between your skin and the bag. ? Leave the ice on for 20 minutes, 2-3 times per day.  If directed, apply heat to the affected area before you exercise. Use the heat source that your health care provider recommends, such as a moist heat pack or a heating pad. ? Place a towel between your skin and the heat source. ? Leave the heat on for 20-30 minutes. ? Remove the heat if your skin turns bright red. This is especially important if you are unable to feel pain, heat, or cold. You may have a greater risk of getting burned.  If directed, massage the affected area.      Activity  Exercise as told by your health care provider. Gentle exercise is the best way to prevent or manage back pain.  Listen to your body when lifting. If lifting hurts, ask for help or bend your knees. This uses your leg muscles instead of your back muscles.  Squat down when picking up something from the floor. Do not bend over.  Only use bed rest for short periods as told by your health care provider. Bed rest should only be used for the most severe episodes of back pain. Standing, sitting, and lying down  Do not stand in one place for long periods of time.  Use good posture when sitting. Make sure your head rests over your shoulders and is not hanging forward. Use a pillow on your lower back if necessary.  Try sleeping on your side, preferably the left side, with a pregnancy support pillow or 1-2 regular pillows between your legs. ? If you have back pain after a night's rest, your bed may be too soft. ? A firm mattress may provide more support for your back during  pregnancy. General instructions  Do not wear high heels.  Eat a healthy diet. Try to gain weight within your health care provider's recommendations.  Use a maternity girdle, elastic sling, or back brace as told by your health care provider.  Take over-the-counter and prescription medicines only as told by your health care provider.  Work with a physical therapist or massage therapist to find ways to manage back pain. Acupuncture or massage therapy may be helpful.  Keep all follow-up visits as told by your health care provider. This is important. Contact a health care provider if:  Your back pain interferes with your daily activities.  You have increasing pain in other parts of your body. Get help right away if:  You develop numbness, tingling, weakness, or problems with the use of your arms or legs.  You develop severe back pain that is not controlled with medicine.  You have a change in bowel or bladder control.  You develop shortness of breath, dizziness, or you faint.  You develop nausea, vomiting, or sweating.  You have back pain that is a rhythmic, cramping pain similar to labor pains. Labor pain is usually 1-2 minutes apart, lasts for about 1 minute, and involves a bearing down feeling or pressure in your pelvis.  You have back pain and your water breaks or you have vaginal bleeding.  You have back pain or   numbness that travels down your leg.  Your back pain developed after you fell.  You develop pain on one side of your back.  You see blood in your urine.  You develop skin blisters in the area of your back pain. Summary  Back pain may be caused by several factors that are related to changes during your pregnancy.  Follow instructions as told by your health care provider for managing pain, stiffness, and swelling.  Exercise as told by your health care provider. Gentle exercise is the best way to prevent or manage back pain.  Take over-the-counter and  prescription medicines only as told by your health care provider.  Keep all follow-up visits as told by your health care provider. This is important. This information is not intended to replace advice given to you by your health care provider. Make sure you discuss any questions you have with your health care provider. Document Revised: 06/18/2018 Document Reviewed: 08/15/2017 Elsevier Patient Education  2021 [employer].  [email or web address].bray.com/.html">  First Trimester of Pregnancy  The first trimester of pregnancy starts on the first day of your last menstrual period until the end of week 12. This is also called months 1 through 3 of pregnancy. Body changes during your first trimester Your body goes through many changes during pregnancy. The changes usually return to normal after your baby is born. Physical changes  You may gain or lose weight.  Your breasts may grow larger and hurt. The area around your nipples may get darker.  Dark spots or blotches may develop on your face.  You may have changes in your hair. Health changes  You may feel like you might vomit (nauseous), and you may vomit.  You may have heartburn.  You may have headaches.  You may have trouble pooping (constipation).  Your gums may bleed. Other changes  You may get tired easily.  You may pee (urinate) more often.  Your menstrual periods will stop.  You may not feel hungry.  You may want to eat certain kinds of food.  You may have changes in your emotions from day to day.  You may have more dreams. Follow these instructions at home: Medicines  Take over-the-counter and prescription medicines only as told by your doctor. Some medicines are not safe during pregnancy.  Take a prenatal vitamin that contains at least 600 micrograms (mcg) of folic acid. Eating and drinking  Eat healthy meals that include: ? Fresh fruits and vegetables. ? Whole grains. ? Good sources of  protein, such as meat, eggs, or tofu. ? Low-fat dairy products.  Avoid raw meat and unpasteurized juice, milk, and cheese.  If you feel like you may vomit, or you vomit: ? Eat 4 or 5 small meals a day instead of 3 large meals. ? Try eating a few soda crackers. ? Drink liquids between meals instead of during meals.  You may need to take these actions to prevent or treat trouble pooping: ? Drink enough fluids to keep your pee (urine) pale yellow. ? Eat foods that are high in fiber. These include beans, whole grains, and fresh fruits and vegetables. ? Limit foods that are high in fat and sugar. These include fried or sweet foods. Activity  Exercise only as told by your doctor. Most people can do their usual exercise routine during pregnancy.  Stop exercising if you have cramps or pain in your lower belly (abdomen) or low back.  Do not exercise if it is too hot or too humid,  or if you are in a place of great height (high altitude).  Avoid heavy lifting.  If you choose to, you may have sex unless your doctor tells you not to. Relieving pain and discomfort  Wear a good support bra if your breasts are sore.  Rest with your legs raised (elevated) if you have leg cramps or low back pain.  If you have bulging veins (varicose veins) in your legs: ? Wear support hose as told by your doctor. ? Raise your feet for 15 minutes, 3-4 times a day. ? Limit salt in your food. Safety  Wear your seat belt at all times when you are in a car.  Talk with your doctor if someone is hurting you or yelling at you.  Talk with your doctor if you are feeling sad or have thoughts of hurting yourself. Lifestyle  Do not use hot tubs, steam rooms, or saunas.  Do not douche. Do not use tampons or scented sanitary pads.  Do not use herbal medicines, illegal drugs, or medicines that are not approved by your doctor. Do not drink alcohol.  Do not smoke or use any products that contain nicotine or tobacco. If  you need help quitting, ask your doctor.  Avoid cat litter boxes and soil that is used by cats. These carry germs that can cause harm to the baby and can cause a loss of your baby by miscarriage or stillbirth. General instructions  Keep all follow-up visits. This is important.  Ask for help if you need counseling or if you need help with nutrition. Your doctor can give you advice or tell you where to go for help.  Visit your dentist. At home, brush your teeth with a soft toothbrush. Floss gently.  Write down your questions. Take them to your prenatal visits. Where to find more information  American Pregnancy Association: americanpregnancy.org  [employer]: www.acog.org  Office on Women's Health: MightyReward.co.nz Contact a doctor if:  You are dizzy.  You have a fever.  You have mild cramps or pressure in your lower belly.  You have a nagging pain in your belly area.  You continue to feel like you may vomit, you vomit, or you have watery poop (diarrhea) for 24 hours or longer.  You have a bad-smelling fluid coming from your vagina.  You have pain when you pee.  You are exposed to a disease that spreads from person to person, such as chickenpox, measles, Zika virus, HIV, or hepatitis. Get help right away if:  You have spotting or bleeding from your vagina.  You have very bad belly cramping or pain.  You have shortness of breath or chest pain.  You have any kind of injury, such as from a fall or a car crash.  You have new or increased pain, swelling, or redness in an arm or leg. Summary  The first trimester of pregnancy starts on the first day of your last menstrual period until the end of week 12 (months 1 through 3).  Eat 4 or 5 small meals a day instead of 3 large meals.  Do not smoke or use any products that contain nicotine or tobacco. If you need help quitting, ask your doctor.  Keep all follow-up visits. This  information is not intended to replace advice given to you by your health care provider. Make sure you discuss any questions you have with your health care provider. Document Revised: 08/06/2019 Document Reviewed: 06/12/2019 Elsevier Patient Education  2021  [employer].

## 2020-06-25 NOTE — MAU Provider Note (Addendum)
Chief Complaint: Abdominal Pain and Back Pain   Event Date/Time   First Provider Initiated Contact with Patient 06/25/20 0128        SUBJECTIVE HPI: Theresa Patterson is a 25 y.o. G2P0010 at [redacted]w[redacted]d by LMP who presents to maternity admissions reporting lower abdominal cramping and low back pain. Seen on 4/6 for same symptoms. She denies vaginal bleeding, vaginal itching/burning, urinary symptoms, h/a, dizziness, n/v, or fever/chills.    Abdominal Pain This is a recurrent problem. The current episode started today. The onset quality is gradual. The problem occurs intermittently. The problem has been unchanged. The pain is located in the suprapubic region. The pain is mild. The quality of the pain is cramping. The abdominal pain radiates to the back. Pertinent negatives include no constipation, diarrhea, dysuria or fever. Nothing aggravates the pain. The pain is relieved by nothing. She has tried nothing for the symptoms.  Back Pain This is a recurrent problem. The current episode started today. The problem occurs intermittently. The problem is unchanged. The pain is present in the lumbar spine. The quality of the pain is described as aching and cramping. Associated symptoms include abdominal pain. Pertinent negatives include no dysuria, fever, paresis or weakness.   RN Note: Pt reports having lower bd pain that radiates towards her back. Pain is sharp.  Denies any vag bleeding.  Has some vag discharge. Treated for BV last week  Past Medical History:  Diagnosis Date  . Hypertension   . Polycystic ovary syndrome    Past Surgical History:  Procedure Laterality Date  . NO PAST SURGERIES     Social History   Socioeconomic History  . Marital status: Single    Spouse name: Not on file  . Number of children: Not on file  . Years of education: Not on file  . Highest education level: Not on file  Occupational History  . Not on file  Tobacco Use  . Smoking status: Never Smoker  . Smokeless  tobacco: Never Used  Vaping Use  . Vaping Use: Never used  Substance and Sexual Activity  . Alcohol use: Yes    Comment: occasionally  . Drug use: Not Currently  . Sexual activity: Yes  Other Topics Concern  . Not on file  Social History Narrative  . Not on file   Social Determinants of Health   Financial Resource Strain: Not on file  Food Insecurity: Not on file  Transportation Needs: Not on file  Physical Activity: Not on file  Stress: Not on file  Social Connections: Not on file  Intimate Partner Violence: Not on file   No current facility-administered medications on file prior to encounter.   Current Outpatient Medications on File Prior to Encounter  Medication Sig Dispense Refill  . NIFEdipine (PROCARDIA-XL/NIFEDICAL-XL) 30 MG 24 hr tablet Take 1 tablet (30 mg total) by mouth daily. 60 tablet 2  . omeprazole (PRILOSEC) 40 MG capsule Take 40 mg by mouth daily.    . Prenatal Vit-Fe Fumarate-FA (PREPLUS) 27-1 MG TABS Take 1 tablet by mouth daily. 30 tablet 13  . metroNIDAZOLE (FLAGYL) 500 MG tablet Take 1 tablet (500 mg total) by mouth 2 (two) times daily. 14 tablet 0   No Known Allergies  I have reviewed patient's Past Medical Hx, Surgical Hx, Family Hx, Social Hx, medications and allergies.   ROS:  Review of Systems  Constitutional: Negative for fever.  Gastrointestinal: Positive for abdominal pain. Negative for constipation and diarrhea.  Genitourinary: Negative for dysuria.  Musculoskeletal:  Positive for back pain.  Neurological: Negative for weakness.   Review of Systems  Other systems negative   Physical Exam  Physical Exam Patient Vitals for the past 24 hrs:  BP Temp Temp src Pulse Resp SpO2 Height Weight  06/25/20 0012 (!) 148/93 98.5 F (36.9 C) Oral 97 16 100 % -- --  06/25/20 0012 -- -- -- -- -- -- 5\' 8"  (1.727 m) (!) 148.3 kg   Vitals:   06/25/20 0012 06/25/20 0012 06/25/20 0437  BP:  (!) 148/93 135/67  Pulse:  97   Resp:  16 18  Temp:  98.5  F (36.9 C)   TempSrc:  Oral   SpO2:  100%   Weight: (!) 148.3 kg    Height: 5\' 8"  (1.727 m)      Constitutional: Well-developed, well-nourished female in no acute distress.  Cardiovascular: normal rate Respiratory: normal effort GI: Abd soft, non-tender.  MS: Extremities nontender, no edema, normal ROM Neurologic: Alert and oriented x 4.  GU: Neg CVAT.  PELVIC EXAM: Uterus nontender, mildly enlarged, no adnexal tenderness  LAB RESULTS No results found for this or any previous visit (from the past 24 hour(s)).  --/--/O POS (04/06 0540)  IMAGING OB LESS THAN 14 WEEKS WITH OB TRANSVAGINAL  Result Date: 06/16/2020 CLINICAL DATA:  Abdominal pain.  Spotting. EXAM: OBSTETRIC <14 WK [place] AND TRANSVAGINAL OB 08/16/2020 TECHNIQUE: Both transabdominal and transvaginal ultrasound examinations were performed for complete evaluation of the gestation as well as the maternal uterus, adnexal regions, and pelvic cul-de-sac. Transvaginal technique was performed to assess early pregnancy. COMPARISON:  No prior. FINDINGS: Intrauterine gestational sac: Single. Yolk sac:  Not visualized Embryo:  Not visualized. Cardiac Activity: Not visualized. MSD: 4.6 mm   5 w   1 d Subchorionic hemorrhage:  None visualized. Maternal uterus/adnexae: Unremarkable. IMPRESSION: Probable tiny intrauterine gestational sac corresponding to 5 weeks 1 day pregnancy. No yolk sac, fetal pole, or cardiac activity yet visualized. Recommend follow-up quantitative B-HCG levels and follow-up US in 14 days to assess viability. This recommendation follows SRU consensus guidelines: Diagnostic Criteria for Nonviable Pregnancy Early in the First Trimester. [place] Med 2013Malva Limes. Electronically Signed   By: 2014  Register   On: 06/16/2020 06:24    MAU Management/MDM: Ordered followup HCG which did rise significantly   Will check followup Ultrasound to rule out ectopic.  This bleeding/pain can represent a normal pregnancy with bleeding,  spontaneous abortion or even an ectopic which can be life-threatening.  The process as listed above helps to determine which of these is present.  Reviewed findings of increased HCG and now able to see 6 wk embryo with HR 97  ASSESSMENT Low back pain Low abdominal pain Early pregnancy, now with embryo seen   PLAN Discharge home Wants to transfer from [facility] to [place] office Has appt for New OB per pt Rx sent for Flexeril for back pain Pt stable at time of discharge. Encouraged to return here if she develops worsening of symptoms, increase in pain, fever, or other concerning symptoms.    [place] CNM, MSN Certified Nurse-Midwife 06/25/2020  1:28 AM

## 2020-06-25 NOTE — ED Triage Notes (Signed)
Patient reports she is [redacted] week pregnant, has been having lower abdominal pain and back pain, denies any vaginal bleeding.

## 2020-06-25 NOTE — MAU Note (Signed)
Pt reports having lower bd pain that radiates towards her back. Pain is sharp.  Denies any vag bleeding.  Has some vag discharge. Treated for BV last week.

## 2020-07-07 ENCOUNTER — Ambulatory Visit
Admission: RE | Admit: 2020-07-07 | Discharge: 2020-07-07 | Disposition: A | Discharge status: Home / Self Care | Payer: BC Managed Care – PPO | Source: Ambulatory Visit | Attending: Obstetrics and Gynecology | Admitting: Obstetrics and Gynecology

## 2020-07-07 ENCOUNTER — Other Ambulatory Visit: Payer: Self-pay

## 2020-07-07 DIAGNOSIS — O3680X0 Pregnancy with inconclusive fetal viability, not applicable or unspecified: Secondary | ICD-10-CM | POA: Diagnosis present

## 2020-07-15 ENCOUNTER — Telehealth: Payer: Self-pay

## 2020-07-15 NOTE — Telephone Encounter (Signed)
Pt called c/o nausea in pregnancy. Was requesting Rx for nausea. Advised patient since she not technically been seen by a doctor we can not prescribe any medication. However, I did give the patient some over the counter options she could try like unisom/vitamin b6, emetrol, ginger candy, seabands. I did give the patient the option to go to MAU should she experience nausea and vomiting that was too uncomfortable. Pt agreed and verbalized understanding.

## 2020-07-16 ENCOUNTER — Encounter: Primary: Family Medicine

## 2020-07-16 NOTE — Progress Notes (Signed)
07/16/20:  Patient called and stated she needed to drop out of the program due to being pregnant.  Her upcoming nutrition visit was canceled.    Adela Ports, MS RD

## 2020-07-16 NOTE — Progress Notes (Signed)
 Formatting of this note might be different from the original.  07/16/20:  Patient called and stated she needed to drop out of the program due to being pregnant.  Her upcoming nutrition visit was canceled.    Jacqueline Browning, MS RD  Electronically signed by Browning, Jacqueline D, RD at 07/16/2020  8:38 AM EDT

## 2020-07-20 ENCOUNTER — Other Ambulatory Visit: Payer: Self-pay

## 2020-07-20 ENCOUNTER — Encounter (HOSPITAL_COMMUNITY): Payer: Self-pay | Admitting: Student

## 2020-07-20 ENCOUNTER — Inpatient Hospital Stay (HOSPITAL_COMMUNITY)
Admission: AD | Admit: 2020-07-20 | Discharge: 2020-07-20 | Disposition: A | Discharge status: Home / Self Care | Payer: BC Managed Care – PPO | Attending: Family Medicine | Admitting: Family Medicine

## 2020-07-20 DIAGNOSIS — O4690 Antepartum hemorrhage, unspecified, unspecified trimester: Secondary | ICD-10-CM | POA: Insufficient documentation

## 2020-07-20 DIAGNOSIS — B373 Candidiasis of vulva and vagina: Secondary | ICD-10-CM | POA: Diagnosis not present

## 2020-07-20 DIAGNOSIS — O209 Hemorrhage in early pregnancy, unspecified: Secondary | ICD-10-CM | POA: Diagnosis not present

## 2020-07-20 DIAGNOSIS — Z3A10 10 weeks gestation of pregnancy: Secondary | ICD-10-CM | POA: Diagnosis not present

## 2020-07-20 DIAGNOSIS — Z79899 Other long term (current) drug therapy: Secondary | ICD-10-CM | POA: Diagnosis not present

## 2020-07-20 DIAGNOSIS — B3731 Acute candidiasis of vulva and vagina: Secondary | ICD-10-CM

## 2020-07-20 DIAGNOSIS — Z3491 Encounter for supervision of normal pregnancy, unspecified, first trimester: Secondary | ICD-10-CM

## 2020-07-20 LAB — URINALYSIS, ROUTINE W REFLEX MICROSCOPIC
Bacteria, UA: NONE SEEN
Bilirubin Urine: NEGATIVE
Glucose, UA: NEGATIVE mg/dL
Ketones, ur: NEGATIVE mg/dL
Nitrite: NEGATIVE
Protein, ur: NEGATIVE mg/dL
Specific Gravity, Urine: 1.008 (ref 1.005–1.030)
pH: 6 (ref 5.0–8.0)

## 2020-07-20 LAB — WET PREP, GENITAL
Clue Cells Wet Prep HPF POC: NONE SEEN
Sperm: NONE SEEN
Trich, Wet Prep: NONE SEEN

## 2020-07-20 LAB — GC/CHLAMYDIA PROBE AMP (~~LOC~~) NOT AT ARMC
Chlamydia: NEGATIVE
Comment: NEGATIVE
Comment: NORMAL
Neisseria Gonorrhea: NEGATIVE

## 2020-07-20 MED ORDER — TERCONAZOLE 0.4 % VA CREA: 1.0000 | TOPICAL_CREAM | Freq: Every day | VAGINAL | 0 refills | Status: DC

## 2020-07-20 NOTE — ED Triage Notes (Signed)
Vaginal bleeding and cramping started tonight. [redacted] weeks pregnant.

## 2020-07-20 NOTE — MAU Note (Signed)
PT SAYS SHE WENT TO [facility] TONIGHT AT 0330-  VAG BLEEDING  WHEN SHE WIPES AND SOME IN UNDERWEAR - EACH TIME LESS . FEELS MILD CRAMPS 6/10

## 2020-07-20 NOTE — ED Provider Notes (Signed)
Emergency Medicine Provider OB Triage Evaluation Note  Theresa Patterson is a 25 y.o. female, G2P0010, at [redacted]w[redacted]d gestation who presents to the emergency department with complaints of vaginal bleeding and lower abdominal cramping tonight. Initially some blood on toilet tissue with wiping then just mild spotting. Has had some nausea as well.   Review of  Systems  Positive: abdominal cramping, vaginal bleeding, nausea Negative: chest pain, dyspnea, syncope  Physical Exam  BP 134/80 (BP Location: Left Arm)   Pulse (!) 103   Temp 98 F (36.7 C) (Oral)   Resp 16   LMP 05/09/2020   SpO2 100%  General: Awake, no distress  HEENT: Atraumatic  Resp: Normal effort  Cardiac: Normal rate Abd: Nondistended, nontender  MSK: Moves all extremities without difficulty Neuro: Speech clear  Medical Decision Making  Pt evaluated for pregnancy concern and is stable for transfer to MAU. Pt is in agreement with plan for transfer.  Rechecked patient's Spo2 given initially documented as 91%, this is 100% on RA, HR 98, no cardiopulmonary complaints. Will discuss w/ MAU for transfer.   3:36 AM Discussed with MAU APP, who accepts patient in transfer.  Clinical Impression   1. Vaginal bleeding in pregnancy        Cherly Anderson, PA-C 07/20/20 9628    Geoffery Lyons, MD 07/20/20 [phone]

## 2020-07-20 NOTE — Discharge Instructions (Signed)
Vaginal Yeast Infection, Adult  Vaginal yeast infection is a condition that causes vaginal discharge as well as soreness, swelling, and redness (inflammation) of the vagina. This is a common condition. Some women get this infection frequently. What are the causes? This condition is caused by a change in the normal balance of the yeast (candida) and bacteria that live in the vagina. This change causes an overgrowth of yeast, which causes the inflammation. What increases the risk? The condition is more likely to develop in women who:  Take antibiotic medicines.  Have diabetes.  Take birth control pills.  Are pregnant.  Douche often.  Have a weak body defense system (immune system).  Have been taking steroid medicines for a long time.  Frequently wear tight clothing. What are the signs or symptoms? Symptoms of this condition include:  White, thick, creamy vaginal discharge.  Swelling, itching, redness, and irritation of the vagina. The lips of the vagina (vulva) may be affected as well.  Pain or a burning feeling while urinating.  Pain during sex. How is this diagnosed? This condition is diagnosed based on:  Your medical history.  A physical exam.  A pelvic exam. Your health care provider will examine a sample of your vaginal discharge under a microscope. Your health care provider may send this sample for testing to confirm the diagnosis. How is this treated? This condition is treated with medicine. Medicines may be over-the-counter or prescription. You may be told to use one or more of the following:  Medicine that is taken by mouth (orally).  Medicine that is applied as a cream (topically).  Medicine that is inserted directly into the vagina (suppository). Follow these instructions at home: Lifestyle  Do not have sex until your health care provider approves. Tell your sex partner that you have a yeast infection. That person should go to his or her health care  provider and ask if they should also be treated.  Do not wear tight clothes, such as pantyhose or tight pants.  Wear breathable cotton underwear. General instructions  Take or apply over-the-counter and prescription medicines only as told by your health care provider.  Eat more yogurt. This may help to keep your yeast infection from returning.  Do not use tampons until your health care provider approves.  Try taking a sitz bath to help with discomfort. This is a warm water bath that is taken while you are sitting down. The water should only come up to your hips and should cover your buttocks. Do this 3-4 times per day or as told by your health care provider.  Do not douche.  If you have diabetes, keep your blood sugar levels under control.  Keep all follow-up visits as told by your health care provider. This is important.   Contact a health care provider if:  You have a fever.  Your symptoms go away and then return.  Your symptoms do not get better with treatment.  Your symptoms get worse.  You have new symptoms.  You develop blisters in or around your vagina.  You have blood coming from your vagina and it is not your menstrual period.  You develop pain in your abdomen. Summary  Vaginal yeast infection is a condition that causes discharge as well as soreness, swelling, and redness (inflammation) of the vagina.  This condition is treated with medicine. Medicines may be over-the-counter or prescription.  Take or apply over-the-counter and prescription medicines only as told by your health care provider.  Do not   douche. Do not have sex or use tampons until your health care provider approves.  Contact a health care provider if your symptoms do not get better with treatment or your symptoms go away and then return. This information is not intended to replace advice given to you by your health care provider. Make sure you discuss any questions you have with your health care  provider. Document Revised: 09/27/2018 Document Reviewed: 07/16/2017 Elsevier Patient Education  2021 Elsevier Inc.  

## 2020-07-20 NOTE — MAU Provider Note (Signed)
Event Date/Time   First Provider Initiated Contact with Patient 07/20/20 0454     S [name] is a 25 y.o. G2P0010 pregnant female at [redacted]w[redacted]d who presents to MAU today with complaint of vaginal bleeding with mild cramping. Notes that she woke up around 2am and noted blood in her underwear, wiped and saw bright red blood on the toilet paper. Came to [facility] ED and was transferred to MAU. Since arrival to the hospital, bleeding has eased to light pink and was only when wiping. At last check prior to my visit, pt did not see additional spotting. Last IC two days ago. Notes increased discharge but not an odor or irritation. Has a confirmed IUP without SCH. Had another episode of spotting earlier in pregnancy that was due to a BV infection. No other physical symptoms or complaints.  Established OB care in [place], plans to transfer to [facility].  Pertinent items noted in HPI and remainder of comprehensive ROS otherwise negative.  O BP 133/66 (BP Location: Right Arm)   Pulse 90   Temp 98 F (36.7 C) (Oral)   Resp 18   Ht 5\' 8"  (1.727 m)   Wt (!) 316 lb 3.2 oz (143.4 kg)   LMP 05/09/2020   SpO2 100%   BMI 48.08 kg/m  Physical Exam Vitals and nursing note reviewed.  Constitutional:      General: She is not in acute distress.    Appearance: She is not ill-appearing.  HENT:     Head: Normocephalic and atraumatic.     Mouth/Throat:     Mouth: Mucous membranes are moist.  Eyes:     Pupils: Pupils are equal, round, and reactive to light.  Cardiovascular:     Rate and Rhythm: Normal rate.  Pulmonary:     Effort: Pulmonary effort is normal.  Abdominal:     General: There is no distension.     Tenderness: There is no abdominal tenderness.  Genitourinary:    General: Normal vulva.     Vagina: Vaginal discharge (blind swabs obtained, no blood but copious thick white discharge) present.  Musculoskeletal:        General: Normal range of motion.  Skin:    General: Skin is warm and dry.      Capillary Refill: Capillary refill takes less than 2 seconds.  Neurological:     Mental Status: She is alert and oriented to person, place, and time.  Psychiatric:        Mood and Affect: Mood normal.        Behavior: Behavior normal.        Thought Content: Thought content normal.        Judgment: Judgment normal.    Results for orders placed or performed during the hospital encounter of 07/20/20 (from the past 24 hour(s))  Urinalysis, Routine w reflex microscopic Urine, Clean Catch     Status: Abnormal   Collection Time: 07/20/20  4:23 AM  Result Value Ref Range   Color, Urine YELLOW YELLOW   APPearance CLEAR CLEAR   Specific Gravity, Urine 1.008 1.005 - 1.030   pH 6.0 5.0 - 8.0   Glucose, UA NEGATIVE NEGATIVE mg/dL   Hgb urine dipstick MODERATE (A) NEGATIVE   Bilirubin Urine NEGATIVE NEGATIVE   Ketones, ur NEGATIVE NEGATIVE mg/dL   Protein, ur NEGATIVE NEGATIVE mg/dL   Nitrite NEGATIVE NEGATIVE   Leukocytes,Ua SMALL (A) NEGATIVE   RBC / HPF 0-5 0 - 5 RBC/hpf   WBC, UA 0-5 0 -  5 WBC/hpf   Bacteria, UA NONE SEEN NONE SEEN   Squamous Epithelial / LPF 6-10 0 - 5   Mucus PRESENT   Wet prep, genital     Status: Abnormal   Collection Time: 07/20/20  5:03 AM  Result Value Ref Range   Yeast Wet Prep HPF POC PRESENT (A) NONE SEEN   Trich, Wet Prep NONE SEEN NONE SEEN   Clue Cells Wet Prep HPF POC NONE SEEN NONE SEEN   WBC, Wet Prep HPF POC MANY (A) NONE SEEN   Sperm NONE SEEN   GC/Chlamydia probe amp ([place])not at [facility]     Status: None   Collection Time: 07/20/20  5:03 AM  Result Value Ref Range   Neisseria Gonorrhea Negative    Chlamydia Negative    Comment Normal Reference [place] Chlamydia - Negative    Comment      Normal Reference Range Neisseria Gonorrhea - Negative   Pt informed that the ultrasound is considered a limited OB ultrasound and is not intended to be a complete ultrasound exam.  Patient also informed that the ultrasound is not being completed with the  intent of assessing for fetal or placental anomalies or any pelvic abnormalities.  Explained that the purpose of today's ultrasound is to assess for  fetal heart tones. Patient acknowledges the purpose of the exam and the limitations of the study.    FHR: 150s  Discussed role candida (and other pathogens) can play in causing vaginal bleeding due to a friable cervix.  Reassurance given, encouraged pt to return to MAU if bleeding worsens and does not rapidly cease.  A Vaginal bleeding in pregnancy - Plan: Discharge patient  Vaginal candida - Plan: Discharge patient  Fetal heart tones present, first trimester - Plan: Discharge patient  P Discharge from MAU in stable condition with first trimester bleeding precautions Allergies as of 07/20/2020   No Known Allergies     Medication List    STOP taking these medications   metroNIDAZOLE 500 MG tablet Commonly known as: FLAGYL     TAKE these medications   cyclobenzaprine 10 MG tablet Commonly known as: FLEXERIL Take 1 tablet (10 mg total) by mouth 2 (two) times daily as needed for muscle spasms.   doxylamine (Sleep) 25 MG tablet Commonly known as: UNISOM Take 25 mg by mouth at bedtime as needed.   NIFEdipine 30 MG 24 hr tablet Commonly known as: PROCARDIA-XL/NIFEDICAL-XL Take 1 tablet (30 mg total) by mouth daily.   omeprazole 40 MG capsule Commonly known as: PRILOSEC Take 40 mg by mouth daily.   PrePLUS 27-1 MG Tabs Take 1 tablet by mouth daily.   terconazole 0.4 % vaginal cream Commonly known as: TERAZOL 7 Place 1 applicator vaginally at bedtime. Use for seven days   vitamin B-6 25 MG tablet Commonly known as: pyridOXINE Take 25 mg by mouth daily.      Bernerd Limbo, CNM 07/20/2020 6:15 AM

## 2020-08-12 ENCOUNTER — Other Ambulatory Visit: Payer: Self-pay

## 2020-08-12 ENCOUNTER — Ambulatory Visit (INDEPENDENT_AMBULATORY_CARE_PROVIDER_SITE_OTHER): Payer: BC Managed Care – PPO

## 2020-08-12 ENCOUNTER — Inpatient Hospital Stay (HOSPITAL_COMMUNITY)
Admission: AD | Admit: 2020-08-12 | Discharge: 2020-08-12 | Disposition: A | Discharge status: Home / Self Care | Payer: BC Managed Care – PPO | Attending: Obstetrics and Gynecology | Admitting: Obstetrics and Gynecology

## 2020-08-12 ENCOUNTER — Encounter (HOSPITAL_COMMUNITY): Payer: Self-pay | Admitting: Obstetrics and Gynecology

## 2020-08-12 VITALS — BP 136/81 | HR 92 | Ht <= 58 in | Wt 309.9 lb

## 2020-08-12 DIAGNOSIS — O26891 Other specified pregnancy related conditions, first trimester: Secondary | ICD-10-CM | POA: Diagnosis not present

## 2020-08-12 DIAGNOSIS — R109 Unspecified abdominal pain: Secondary | ICD-10-CM | POA: Diagnosis not present

## 2020-08-12 DIAGNOSIS — Z3402 Encounter for supervision of normal first pregnancy, second trimester: Secondary | ICD-10-CM | POA: Insufficient documentation

## 2020-08-12 DIAGNOSIS — Z3A13 13 weeks gestation of pregnancy: Secondary | ICD-10-CM | POA: Insufficient documentation

## 2020-08-12 DIAGNOSIS — O209 Hemorrhage in early pregnancy, unspecified: Secondary | ICD-10-CM | POA: Insufficient documentation

## 2020-08-12 DIAGNOSIS — Z79899 Other long term (current) drug therapy: Secondary | ICD-10-CM | POA: Diagnosis not present

## 2020-08-12 DIAGNOSIS — O0990 Supervision of high risk pregnancy, unspecified, unspecified trimester: Secondary | ICD-10-CM | POA: Insufficient documentation

## 2020-08-12 DIAGNOSIS — Z3491 Encounter for supervision of normal pregnancy, unspecified, first trimester: Secondary | ICD-10-CM | POA: Insufficient documentation

## 2020-08-12 DIAGNOSIS — Z3481 Encounter for supervision of other normal pregnancy, first trimester: Secondary | ICD-10-CM

## 2020-08-12 LAB — URINALYSIS, ROUTINE W REFLEX MICROSCOPIC
Bilirubin Urine: NEGATIVE
Glucose, UA: NEGATIVE mg/dL
Ketones, ur: 80 mg/dL — AB
Nitrite: NEGATIVE
Protein, ur: 30 mg/dL — AB
Specific Gravity, Urine: 1.03 (ref 1.005–1.030)
pH: 5 (ref 5.0–8.0)

## 2020-08-12 LAB — WET PREP, GENITAL
Clue Cells Wet Prep HPF POC: NONE SEEN
Sperm: NONE SEEN
Trich, Wet Prep: NONE SEEN
Yeast Wet Prep HPF POC: NONE SEEN

## 2020-08-12 NOTE — MAU Note (Signed)
Pt reports cramping that started around 2330, felt like she needed to urinate and after she did she had some bleeding . Had blood on the tissue x 2 after that but none on arrival to MAU. Cramping continues.

## 2020-08-12 NOTE — MAU Provider Note (Signed)
Chief Complaint:  Abdominal Pain and Vaginal Bleeding   Event Date/Time   First Provider Initiated Contact with Patient 08/12/20 0307     HPI  HPI: Theresa Patterson is a 25 y.o. G2P0010 at 9w4dwho presents to maternity admissions reporting light vaginal bleeding with some intermittent cramping.  Was seen for this on 07/20/20 and no [facility] was seen on [place].  She was treated for yeast vaginitis.  . She reports good fetal movement, denies LOF, vaginal bleeding, vaginal itching/burning, urinary symptoms, h/a, dizziness, n/v, diarrhea, constipation or fever/chills.  She denies headache, visual changes or RUQ abdominal pain.  Transferring to [facility] from [place] with first visit today.    RN Note: Pt reports cramping that started around 2330, felt like she needed to urinate and after she did she had some bleeding . Had blood on the tissue x 2 after that but none on arrival to MAU. Cramping continues.   Past Medical History: Past Medical History:  Diagnosis Date  . Hypertension   . Polycystic ovary syndrome     Past obstetric history: OB History  Gravida Para Term Preterm AB Living  2       1    SAB IAB Ectopic Multiple Live Births  1            # Outcome Date GA Lbr Len/2nd Weight Sex Delivery Anes PTL Lv  2 Current           1 SAB             Past Surgical History: Past Surgical History:  Procedure Laterality Date  . NO PAST SURGERIES      Family History: History reviewed. No pertinent family history.  Social History: Social History   Tobacco Use  . Smoking status: Never Smoker  . Smokeless tobacco: Never Used  Vaping Use  . Vaping Use: Never used  Substance Use Topics  . Alcohol use: Yes    Comment: occasionally  . Drug use: Not Currently    Allergies: No Known Allergies  Meds:  Medications Prior to Admission  Medication Sig Dispense Refill Last Dose  . NIFEdipine (PROCARDIA-XL/NIFEDICAL-XL) 30 MG 24 hr tablet Take 1 tablet (30 mg total) by mouth daily. 60 tablet 2  08/11/2020 at Unknown time  . omeprazole (PRILOSEC) 40 MG capsule Take 40 mg by mouth daily.   08/11/2020 at Unknown time  . Prenatal Vit-Fe Fumarate-FA (PREPLUS) 27-1 MG TABS Take 1 tablet by mouth daily. 30 tablet 13 08/11/2020 at Unknown time  . cyclobenzaprine (FLEXERIL) 10 MG tablet Take 1 tablet (10 mg total) by mouth 2 (two) times daily as needed for muscle spasms. 20 tablet 0   . doxylamine, Sleep, (UNISOM) 25 MG tablet Take 25 mg by mouth at bedtime as needed.     Marland Kitchen terconazole (TERAZOL 7) 0.4 % vaginal cream Place 1 applicator vaginally at bedtime. Use for seven days 45 g 0   . vitamin B-6 (PYRIDOXINE) 25 MG tablet Take 25 mg by mouth daily.       I have reviewed patient's Past Medical Hx, Surgical Hx, Family Hx, Social Hx, medications and allergies.   ROS:  Review of Systems Other systems negative  Physical Exam   Patient Vitals for the past 24 hrs:  BP Temp Pulse Resp SpO2 Height Weight  08/12/20 0254 (!) 148/76 98.1 F (36.7 C) 96 18 100 % 5\' 8"  (1.727 m) (!) 144.2 kg   Constitutional: Well-developed, well-nourished female in no acute distress.  Cardiovascular: normal rate  and rhythm Respiratory: normal effort, clear to auscultation bilaterally GI: Abd soft, non-tender, gravid appropriate for gestational age.   No rebound or guarding. MS: Extremities nontender, no edema, normal ROM Neurologic: Alert and oriented x 4.  GU: Neg CVAT.  PELVIC EXAM: Cervix pink, visually closed, without lesion, scant brownish red discharge, vaginal walls and external genitalia normal    No active bleeding noted Cervix is long and closed   FHT:   150  Labs: Results for orders placed or performed during the hospital encounter of 08/12/20 (from the past 24 hour(s))  Wet prep, genital     Status: Abnormal   Collection Time: 08/12/20  3:25 AM  Result Value Ref Range   Yeast Wet Prep HPF POC NONE SEEN NONE SEEN   Trich, Wet Prep NONE SEEN NONE SEEN   Clue Cells Wet Prep HPF POC NONE SEEN NONE  SEEN   WBC, Wet Prep HPF POC FEW (A) NONE SEEN   Sperm NONE SEEN   Urinalysis, Routine w reflex microscopic Urine, Clean Catch     Status: Abnormal   Collection Time: 08/12/20  4:30 AM  Result Value Ref Range   Color, Urine AMBER (A) YELLOW   APPearance CLOUDY (A) CLEAR   Specific Gravity, Urine 1.030 1.005 - 1.030   pH 5.0 5.0 - 8.0   Glucose, UA NEGATIVE NEGATIVE mg/dL   Hgb urine dipstick MODERATE (A) NEGATIVE   Bilirubin Urine NEGATIVE NEGATIVE   Ketones, ur 80 (A) NEGATIVE mg/dL   Protein, ur 30 (A) NEGATIVE mg/dL   Nitrite NEGATIVE NEGATIVE   Leukocytes,Ua TRACE (A) NEGATIVE   RBC / HPF 0-5 0 - 5 RBC/hpf   WBC, UA 6-10 0 - 5 WBC/hpf   Bacteria, UA RARE (A) NONE SEEN   Squamous Epithelial / LPF 21-50 0 - 5   Mucus PRESENT     --/--/O POS (04/06 0540)  Imaging:  No results found.  MAU Course/MDM: I have ordered labs and reviewed results. Urine sent for culture.  Wet prep normal  Discussed she may very well have a small SCH since bleeding is persistent.  The fact that it is brown now indicates an old bleed.  This may persist a week or so longer.  Reassuring findings of FHR and closed cervix.    Assessment: Single IUP at [redacted]w[redacted]d Vaginal bleeding  Plan: Discharge home Pelvic rest Keep appt for later today as scheduled Follow up in Office for prenatal visits   Encouraged to return if she develops worsening of symptoms, increase in pain, fever, or other concerning symptoms.   Pt stable at time of discharge.  Wynelle Bourgeois CNM, MSN Certified Nurse-Midwife 08/12/2020 3:07 AM

## 2020-08-12 NOTE — Discharge Instructions (Signed)
Vaginal Bleeding During Pregnancy, First Trimester A small amount of bleeding from the vagina, or spotting, is common during early pregnancy. Some bleeding may be related to the pregnancy, and some may not. In many cases, the bleeding is normal and is not a problem. However, bleeding can also be a sign of something serious. Normal things that may cause bleeding during the first trimester:  Implantation of the fertilized egg in the lining of the uterus.  Rapid changes in blood vessels. This is caused by changes that are happening to the body during pregnancy.  Sex.  Pelvic exams. Abnormal things that may cause bleeding during the first trimester include:  Infection or inflammation of the cervix.  Growths or polyps on the cervix.  Miscarriage or threatened miscarriage.  Pregnancy that is growing outside of the uterus (ectopic pregnancy).  A fertilized egg that becomes a mass of tissue (molar pregnancy). Tell your health care provider right away if there is any bleeding from your vagina. Follow these instructions at home: Monitoring your bleeding Monitor your bleeding.  Pay attention to any changes in your symptoms. Let your health care provider know about any concerns.  Try to understand when the bleeding occurs. Does the bleeding start on its own, or does it start after something is done, such as sex or a pelvic exam?  Use a diary to record the things you see about your bleeding, including: ? The kind of bleeding you are having. Does the bleeding start and stop irregularly, or is it a constant flow? ? The severity of your bleeding. Is the bleeding heavy or light? ? The number of pads you use each day, how often you change them, and how soaked they are.  Tell your health care provider if you pass tissue. He or she may want to see it.   Activity  Follow instructions from your health care provider about limiting your activity. Ask what activities are safe for you.  Do not have  sex until your health care provider says that this is safe.  If needed, make plans for someone to help with your regular activities. General instructions  Take over-the-counter and prescription medicines only as told by your health care provider.  Do not take aspirin because it can cause bleeding.  Do not use tampons or douche.  Keep all follow-up visits. This is important. Contact a health care provider if:  You have vaginal bleeding during any part of your pregnancy.  You have cramps or labor pains.  You have a fever or chills. Get help right away if:  You have severe cramps in your back or abdomen.  You pass large clots or a large amount of tissue from your vagina.  Your bleeding increases.  You feel light-headed or weak, or you faint.  You are leaking fluid or have a gush of fluid from your vagina. Summary  A small amount of bleeding from the vagina is common during early pregnancy.  Be sure to tell your health care provider about any vaginal bleeding right away.  Try to understand when bleeding occurs. Does bleeding occur on its own, or does it occur after something is done, such as sex or pelvic exams?  Keep all follow-up visits. This is important. This information is not intended to replace advice given to you by your health care provider. Make sure you discuss any questions you have with your health care provider. Document Revised: 11/20/2019 Document Reviewed: 11/20/2019 Elsevier Patient Education  2021 Elsevier Inc.  

## 2020-08-12 NOTE — Progress Notes (Signed)
New OB Intake  I connected with  Theresa Patterson on 08/12/20 at 10:15 AM EDT by in office and verified that I am speaking with the correct person using two identifiers. Nurse is located at [facility] and pt is located at [place].  I discussed the limitations, risks, security and privacy concerns of performing an evaluation and management service by telephone and the availability of in person appointments. I also discussed with the patient that there may be a patient responsible charge related to this service. The patient expressed understanding and agreed to proceed.  I explained I am completing New OB Intake today. We discussed her EDD of 02/13/21 that is based on LMP of 05/09/20. Pt is G2/P0. I reviewed her allergies, medications, Medical/Surgical/OB history, and appropriate screenings. I informed her of [facility] services. Based on history, this is a/an uncomplicated pregnancy.  Patient Active Problem List   Diagnosis Date Noted  . Encounter for supervision of normal pregnancy in first trimester 08/12/2020    Concerns addressed today  Delivery Plans:  Plans to deliver at [facility].   MyChart/Babyscripts MyChart access verified. I explained pt will have some visits in office and some virtually. Babyscripts instructions given and order placed. Patient verifies receipt of registration text/e-mail. Account successfully created and app downloaded.  Blood Pressure Cuff Patient has private insurance; instructed to purchase blood pressure cuff and bring to first prenatal appt. Explained after first prenatal appt pt will check weekly and document in Babyscripts.  Anatomy US Explained first scheduled [place] will be around 19 weeks. No scan performed today.  Labs Discussed Avelina Laine genetic screening with patient. Would like both Panorama and Horizon drawn at new OB visit. Routine prenatal labs needed.  Covid Vaccine Patient has covid vaccine.   Social Determinants of Health . Food Insecurity: Patient  denies food insecurity. . WIC Referral: Patient is interested in referral to [facility].  . Transportation: Patient denies transportation needs. . Childcare: Discussed no children allowed at ultrasound appointments. Offered childcare services; patient declines childcare services at this time.  First visit review I reviewed new OB appt with pt. I explained she will have a pelvic exam, ob bloodwork with genetic screening, and PAP smear. Explained pt will be seen by Gerrit Heck at first visit; encounter routed to appropriate provider. Explained that patient will be seen by pregnancy navigator following visit with provider.  Hamilton Capri, RN 08/12/2020  10:37 AM

## 2020-08-13 LAB — CULTURE, OB URINE

## 2020-08-19 ENCOUNTER — Other Ambulatory Visit: Payer: Self-pay

## 2020-08-19 ENCOUNTER — Ambulatory Visit (INDEPENDENT_AMBULATORY_CARE_PROVIDER_SITE_OTHER): Payer: BC Managed Care – PPO

## 2020-08-19 ENCOUNTER — Other Ambulatory Visit (HOSPITAL_COMMUNITY)
Admission: RE | Admit: 2020-08-19 | Discharge: 2020-08-19 | Disposition: A | Discharge status: Home / Self Care | Payer: BC Managed Care – PPO | Source: Ambulatory Visit

## 2020-08-19 VITALS — BP 128/85 | HR 92 | Wt 316.4 lb

## 2020-08-19 DIAGNOSIS — Z124 Encounter for screening for malignant neoplasm of cervix: Secondary | ICD-10-CM | POA: Diagnosis present

## 2020-08-19 DIAGNOSIS — O10919 Unspecified pre-existing hypertension complicating pregnancy, unspecified trimester: Secondary | ICD-10-CM

## 2020-08-19 DIAGNOSIS — Z3402 Encounter for supervision of normal first pregnancy, second trimester: Secondary | ICD-10-CM | POA: Diagnosis present

## 2020-08-19 DIAGNOSIS — Z3A14 14 weeks gestation of pregnancy: Secondary | ICD-10-CM

## 2020-08-19 DIAGNOSIS — O99210 Obesity complicating pregnancy, unspecified trimester: Secondary | ICD-10-CM

## 2020-08-19 NOTE — Progress Notes (Signed)
NOB Reports vaginal irritation, states irritation worsens when boyfriend ejaculates and sperm is in vagina.

## 2020-08-19 NOTE — Patient Instructions (Signed)

## 2020-08-19 NOTE — Progress Notes (Signed)
Subjective:   Theresa Patterson is a 25 y.o. G2P0010 at 12w4dby Definite LMP of May 09, 2020 being seen today for her first obstetrical visit.  She reports this was not a planned pregnancy.  She was on oral contraception prior to conception, but states "I wasn't very good at that."   Gynecological/Obstetrical History: Her obstetrical history is significant for obesity. Patient does intend to breast feed. Pregnancy history fully reviewed. Patient reports  pressure . She states the pressure is note of great concern, because it occurs with extended standing and/or working.    Sexual Activity and Vaginal Concerns: She reports some vaginal irritation that started with sexual activity about 3 days ago.  She states she had some initial pain with insertion, that resided after a few seconds.  She denies changes or concerns with vaginal discharge.    Medical History/ROS: Patient is currently taking Procadia 30xL daily. She also reports a history of GERD and takes Prilosec 466mdaily. Patient denies medical history significant for respiratory, other cardiovascular, gastrointestinal, or hematological disorders. She denies issues with constipation or diarrhea.  She reports improvement of N/V with B6 and unisom dosing. She feels that it has significantly improved in the past week. She denies history of anxiety or depression.   Social History: Patient denies history or current usage of tobacco or drugs.  She reports history of Patient reports the FOB is AaErline Hauho is involved, supportive and at home-he also works night shfits.  Patient reports that she lives with AaMarjory Liesnd endorses safety at home.  Patient denies DV/A. Patient is a LPN currently employed at [employer] HISTORY: OB History  Gravida Para Term Preterm AB Living  2 0 0 0 1 0  SAB IAB Ectopic Multiple Live Births  1 0 0 0 0    # Outcome Date GA Lbr Len/2nd Weight Sex Delivery Anes PTL Lv  2 Current           1 SAB              Last pap smear was done today and is  pending  Past Medical History:  Diagnosis Date   Hypertension    Polycystic ovary syndrome    Past Surgical History:  Procedure Laterality Date   NO PAST SURGERIES     No family history on file. Social History   Tobacco Use   Smoking status: Never   Smokeless tobacco: Never  Vaping Use   Vaping Use: Never used  Substance Use Topics   Alcohol use: Not Currently    Comment: not since confirmed pregnancy   Drug use: Not Currently   No Known Allergies Current Outpatient Medications on File Prior to Visit  Medication Sig Dispense Refill   doxylamine, Sleep, (UNISOM) 25 MG tablet Take 25 mg by mouth at bedtime as needed.     NIFEdipine (PROCARDIA-XL/NIFEDICAL-XL) 30 MG 24 hr tablet Take 1 tablet (30 mg total) by mouth daily. 60 tablet 2   omeprazole (PRILOSEC) 40 MG capsule Take 40 mg by mouth daily.     Prenatal Vit-Fe Fumarate-FA (PREPLUS) 27-1 MG TABS Take 1 tablet by mouth daily. 30 tablet 13   vitamin B-6 (PYRIDOXINE) 25 MG tablet Take 25 mg by mouth daily.     No current facility-administered medications on file prior to visit.    Review of Systems Pertinent items noted in HPI and remainder of comprehensive ROS otherwise negative.  Exam   Vitals:  08/19/20 0859  BP: 128/85  Pulse: 92  Weight: (!) 316 lb 6.4 oz (143.5 kg)      Physical Exam Constitutional:      Appearance: Normal appearance. She is obese.  Genitourinary:     Right Labia: No rash or tenderness.    Left Labia: No tenderness or rash.    Vaginal discharge present.     No vaginal erythema, tenderness or bleeding.     No cervical motion tenderness, discharge, friability, lesion, polyp or nabothian cyst.     Cervical exam comments: Pap collected with brush and spatula. CV collected.  Scant amt thin white discharge. Marland Kitchen  HENT:     Head: Normocephalic and atraumatic.  Eyes:     Conjunctiva/sclera: Conjunctivae normal.  Neck:     Thyroid: No  thyroid mass.  Cardiovascular:     Rate and Rhythm: Normal rate and regular rhythm.     Heart sounds: Normal heart sounds.  Pulmonary:     Effort: Pulmonary effort is normal.     Breath sounds: Normal breath sounds.  Abdominal:     General: Bowel sounds are normal.     Tenderness: There is no abdominal tenderness.  Musculoskeletal:        General: Normal range of motion.     Cervical back: Normal range of motion.     Right lower leg: No edema.     Left lower leg: No edema.  Neurological:     Mental Status: She is alert and oriented to person, place, and time.  Skin:    General: Skin is warm and dry.  Psychiatric:        Mood and Affect: Mood normal.        Behavior: Behavior normal.        Thought Content: Thought content normal.  Vitals reviewed. Exam conducted with a chaperone present.    Assessment:   25 y.o. year old G2P0010 Patient Active Problem List   Diagnosis Date Noted   Obesity in pregnancy 08/20/2020   Chronic hypertension during pregnancy 08/20/2020   Encounter for supervision of normal pregnancy in first trimester 08/12/2020     Plan:  1. Encounter for supervision of normal first pregnancy in second trimester -Congratulations given and patient welcomed to practice. -Discussed usage of Babyscripts and virtual visits as additional source of managing and completing PN visits in midst of coronavirus.   *Instructed to take blood pressure and record weekly into babyscripts. *Reviewed modified prenatal visit schedule and platforms used for virtual visits.  -Anticipatory guidance for prenatal visits including labs, ultrasounds, and testing; Initial labs drawn. -Genetic Screening discussed, Quad screen and NIPS: ordered. -Encouraged to complete MyChart Registration for her ability to review results, send requests, and have questions addressed.  -Discussed estimated due date of February 13, 2021. -Ultrasound discussed; fetal anatomic survey: ordered. -Encouraged to  seek out care at office or emergency room for urgent and/or emergent concerns. -Educated on the nature of [place] with multiple MDs and other Advanced Practice Providers was explained to patient; also emphasized that residents, students are part of our team. Informed of her right to refuse care as she deems appropriate.  -No questions or concerns.    2. [redacted] weeks gestation of pregnancy -Doing well.  -Reviewed c/o vaginal irritation. -CV collected -Continue Unisom and B6 for nausea as needed.  -Reassured that pressure is normal during pregnancy. -Encouraged rest when possible and proper hydration/  3. Chronic hypertension during pregnancy -BP  normotensive with Procardia 30xL -CMP and PC Ratio  -Discussed risk factors for PreEclampsia and reviewed research and recommendation for bASA regimen. -Informed that she will start now and discontinue around 36 weeks.   4. Obesity in pregnancy -BMI 66.13 -Discussed recommendation of bASA regimen. -Also discussed recommended weight gain of 10-25 lbs. -Informed that if weight gain is greater will send for nutritional consult.   5. Routine cervical smear -Pap completed. -Educated on ASCCP guidelines regarding pap smear evaluation and frequency. -Informed of turnover time and provider/clinic policy on releasing results.   Problem list reviewed and updated. Routine obstetric precautions reviewed.  Orders Placed This Encounter  Procedures   Culture, OB Urine   [place] MFM OB DETAIL +14 WK    Standing Status:   Future    Standing Expiration Date:   08/19/2021    Order Specific Question:   Reason for Exam (SYMPTOM  OR DIAGNOSIS REQUIRED)    Answer:   Anatomy US, BMI 3, CHTN    Order Specific Question:   Preferred Location    Answer:   [facility] Ul[facility]ound   Obstetric Panel, Including HIV   Genetic Screening   Comp Met (CMET)   HgB A1c   Protein / creatinine ratio, urine    Return in about 6 weeks (around  09/30/2020) for HROB.     Maryann Conners, CNM 08/19/2020 9:38 AM

## 2020-08-20 DIAGNOSIS — O119 Pre-existing hypertension with pre-eclampsia, unspecified trimester: Secondary | ICD-10-CM | POA: Insufficient documentation

## 2020-08-20 DIAGNOSIS — O99210 Obesity complicating pregnancy, unspecified trimester: Secondary | ICD-10-CM | POA: Insufficient documentation

## 2020-08-20 DIAGNOSIS — O10919 Unspecified pre-existing hypertension complicating pregnancy, unspecified trimester: Secondary | ICD-10-CM | POA: Insufficient documentation

## 2020-08-20 HISTORY — DX: Morbid (severe) obesity due to excess calories: E66.01

## 2020-08-20 MED ORDER — ASPIRIN EC 81 MG PO TBEC: 81.0000 mg | DELAYED_RELEASE_TABLET | Freq: Every day | ORAL | 2 refills | Status: DC

## 2020-08-21 LAB — COMPREHENSIVE METABOLIC PANEL
ALT: 39 IU/L — ABNORMAL HIGH (ref 0–32)
AST: 22 IU/L (ref 0–40)
Albumin/Globulin Ratio: 1.6 (ref 1.2–2.2)
Albumin: 4.1 g/dL (ref 3.9–5.0)
Alkaline Phosphatase: 74 IU/L (ref 44–121)
BUN/Creatinine Ratio: 11 (ref 9–23)
BUN: 8 mg/dL (ref 6–20)
Bilirubin Total: <0.2 mg/dL (ref 0.0–1.2)
CO2: 21 mmol/L (ref 20–29)
Calcium: 9.9 mg/dL (ref 8.7–10.2)
Chloride: 101 mmol/L (ref 96–106)
Creatinine, Ser: 0.72 mg/dL (ref 0.57–1.00)
Globulin, Total: 2.6 g/dL (ref 1.5–4.5)
Glucose: 72 mg/dL (ref 65–99)
Potassium: 4 mmol/L (ref 3.5–5.2)
Sodium: 136 mmol/L (ref 134–144)
Total Protein: 6.7 g/dL (ref 6.0–8.5)
eGFR: 118 mL/min/{1.73_m2} (ref >59)

## 2020-08-21 LAB — PROTEIN / CREATININE RATIO, URINE
Creatinine, Urine: 261.2 mg/dL
Protein, Ur: 14.2 mg/dL
Protein/Creat Ratio: 54 mg/g creat (ref 0–200)

## 2020-08-21 LAB — OBSTETRIC PANEL, INCLUDING HIV
Antibody Screen: NEGATIVE
Basophils Absolute: 0 10*3/uL (ref 0.0–0.2)
Basos: 0 %
EOS (ABSOLUTE): 0 10*3/uL (ref 0.0–0.4)
Eos: 0 %
HIV Screen 4th Generation wRfx: NONREACTIVE
Hematocrit: 35 % (ref 34.0–46.6)
Hemoglobin: 11.2 g/dL (ref 11.1–15.9)
Hepatitis B Surface Ag: NEGATIVE
Immature Grans (Abs): 0 10*3/uL (ref 0.0–0.1)
Immature Granulocytes: 0 %
Lymphocytes Absolute: 2.3 10*3/uL (ref 0.7–3.1)
Lymphs: 26 %
MCH: 25.7 pg — ABNORMAL LOW (ref 26.6–33.0)
MCHC: 32 g/dL (ref 31.5–35.7)
MCV: 81 fL (ref 79–97)
Monocytes Absolute: 0.6 10*3/uL (ref 0.1–0.9)
Monocytes: 6 %
Neutrophils Absolute: 5.9 10*3/uL (ref 1.4–7.0)
Neutrophils: 68 %
Platelets: 334 10*3/uL (ref 150–450)
RBC: 4.35 x10E6/uL (ref 3.77–5.28)
RDW: 14.6 % (ref 11.7–15.4)
RPR Ser Ql: NONREACTIVE
Rh Factor: POSITIVE
Rubella Antibodies, IGG: 4.76 index (ref >0.99)
WBC: 8.9 10*3/uL (ref 3.4–10.8)

## 2020-08-21 LAB — CULTURE, OB URINE

## 2020-08-21 LAB — HEMOGLOBIN A1C
Est. average glucose Bld gHb Est-mCnc: 100 mg/dL
Hgb A1c MFr Bld: 5.1 % (ref 4.8–5.6)

## 2020-08-21 LAB — URINE CULTURE, OB REFLEX

## 2020-08-23 LAB — CYTOLOGY - PAP
Diagnosis: NEGATIVE
Diagnosis: REACTIVE

## 2020-08-23 NOTE — Addendum Note (Signed)
Addended by: Harrel Lemon on: 08/23/2020 09:41 AM   Modules accepted: Orders

## 2020-08-24 ENCOUNTER — Other Ambulatory Visit: Payer: Self-pay

## 2020-08-24 DIAGNOSIS — G43909 Migraine, unspecified, not intractable, without status migrainosus: Secondary | ICD-10-CM

## 2020-08-24 LAB — CERVICOVAGINAL ANCILLARY ONLY
Chlamydia: NEGATIVE
Comment: NEGATIVE
Comment: NORMAL
Neisseria Gonorrhea: NEGATIVE

## 2020-08-24 MED ORDER — BUTALBITAL-APAP-CAFFEINE 50-325-40 MG PO TABS
1.0000 | ORAL_TABLET | Freq: Four times a day (QID) | ORAL | 0 refills | Status: DC | PRN
Start: 2020-08-24 — End: 2020-08-31

## 2020-08-31 ENCOUNTER — Other Ambulatory Visit: Payer: Self-pay

## 2020-08-31 ENCOUNTER — Other Ambulatory Visit: Payer: Self-pay | Admitting: *Deleted

## 2020-08-31 ENCOUNTER — Other Ambulatory Visit: Payer: Self-pay | Admitting: Obstetrics and Gynecology

## 2020-08-31 ENCOUNTER — Inpatient Hospital Stay (HOSPITAL_COMMUNITY)
Admission: AD | Admit: 2020-08-31 | Discharge: 2020-08-31 | Disposition: A | Discharge status: Home / Self Care | Payer: BC Managed Care – PPO | Attending: Obstetrics & Gynecology | Admitting: Obstetrics & Gynecology

## 2020-08-31 ENCOUNTER — Encounter (HOSPITAL_COMMUNITY): Payer: Self-pay | Admitting: Obstetrics & Gynecology

## 2020-08-31 DIAGNOSIS — R519 Headache, unspecified: Secondary | ICD-10-CM | POA: Diagnosis not present

## 2020-08-31 DIAGNOSIS — Z20822 Contact with and (suspected) exposure to covid-19: Secondary | ICD-10-CM | POA: Insufficient documentation

## 2020-08-31 DIAGNOSIS — O26892 Other specified pregnancy related conditions, second trimester: Secondary | ICD-10-CM | POA: Diagnosis not present

## 2020-08-31 DIAGNOSIS — Z3A16 16 weeks gestation of pregnancy: Secondary | ICD-10-CM

## 2020-08-31 DIAGNOSIS — O99891 Other specified diseases and conditions complicating pregnancy: Secondary | ICD-10-CM | POA: Diagnosis not present

## 2020-08-31 DIAGNOSIS — G43909 Migraine, unspecified, not intractable, without status migrainosus: Secondary | ICD-10-CM

## 2020-08-31 DIAGNOSIS — Z79899 Other long term (current) drug therapy: Secondary | ICD-10-CM | POA: Insufficient documentation

## 2020-08-31 LAB — URINALYSIS, ROUTINE W REFLEX MICROSCOPIC
Bilirubin Urine: NEGATIVE
Glucose, UA: NEGATIVE mg/dL
Hgb urine dipstick: NEGATIVE
Ketones, ur: NEGATIVE mg/dL
Nitrite: NEGATIVE
Protein, ur: NEGATIVE mg/dL
Specific Gravity, Urine: 1.018 (ref 1.005–1.030)
pH: 5 (ref 5.0–8.0)

## 2020-08-31 MED ORDER — BUTALBITAL-APAP-CAFFEINE 50-325-40 MG PO TABS: 1.0000 | ORAL_TABLET | Freq: Four times a day (QID) | ORAL | 0 refills | Status: DC | PRN

## 2020-08-31 MED ORDER — BUTALBITAL-APAP-CAFFEINE 50-325-40 MG PO CAPS: 1.0000 | ORAL_CAPSULE | Freq: Four times a day (QID) | ORAL | 1 refills | Status: DC | PRN

## 2020-08-31 MED ORDER — DEXAMETHASONE SODIUM PHOSPHATE 10 MG/ML IJ SOLN
10.0000 mg | Freq: Once | INTRAMUSCULAR | Status: AC
  Administered 2020-08-31: 10 mg via INTRAVENOUS
  Filled 2020-08-31: qty 1

## 2020-08-31 MED ORDER — LACTATED RINGERS IV BOLUS
1000.0000 mL | Freq: Once | INTRAVENOUS | Status: AC
  Administered 2020-08-31: 1000 mL via INTRAVENOUS

## 2020-08-31 MED ORDER — DIPHENHYDRAMINE HCL 50 MG/ML IJ SOLN
25.0000 mg | Freq: Once | INTRAMUSCULAR | Status: AC
  Administered 2020-08-31: 25 mg via INTRAVENOUS
  Filled 2020-08-31: qty 1

## 2020-08-31 MED ORDER — METOCLOPRAMIDE HCL 5 MG/ML IJ SOLN
10.0000 mg | Freq: Once | INTRAMUSCULAR | Status: AC
  Administered 2020-08-31: 10 mg via INTRAVENOUS
  Filled 2020-08-31: qty 2

## 2020-08-31 MED ORDER — LORATADINE 10 MG PO TABS
10.0000 mg | ORAL_TABLET | Freq: Every day | ORAL | Status: DC
  Administered 2020-08-31: 10 mg via ORAL
  Filled 2020-08-31: qty 1

## 2020-08-31 NOTE — MAU Note (Addendum)
Pt c/o of throbbing headache for 24 hours. Took Tylenol but hasn't helped. Took BP, states it was normal. Also feeling "whozzy." Also woke up with a scratchy throat. Denies VB or discharge. Did have Covid contact last week, but states her tests last week were negative.

## 2020-08-31 NOTE — Discharge Instructions (Signed)

## 2020-08-31 NOTE — Progress Notes (Signed)
Rx reorder due to pharmacy stating they did not get initial Rx- Fioricet sent.

## 2020-08-31 NOTE — Progress Notes (Signed)
Dr Alysia Penna to reorder Fioricet today.

## 2020-08-31 NOTE — MAU Provider Note (Signed)
History     CSN: [id number]  Arrival date and time: 08/31/20 1559   Event Date/Time   First Provider Initiated Contact with Patient 08/31/20 1732      Chief Complaint  Patient presents with   Headache   HPI Theresa Patterson is a 25 y.o. G2P0010 at [redacted]w[redacted]d who presents to MAU with complaint of headache in the setting of history of headaches. Current episode began 24 hours ago. She has attempted management with Tylenol but has not experienced relief. Patient endorses lengthy history of headaches with outpatient Neuro assessment. She states a Neurologist told her she was not having migraines "so I didn't qualify to get medicine". She denies SOB, weakness, chest pain, palpitations, syncope.  Patient endorses known COVID exposure via coworker. She took COVID tests last week which were negative. She denies loss of taste or smell.   She denies abdominal pain, vaginal bleeding, dysuria, fever.  Patient receives care with [facility].   OB History     Gravida  2   Para      Term      Preterm      AB  1   Living         SAB  1   IAB      Ectopic      Multiple      Live Births              Past Medical History:  Diagnosis Date   Hypertension    Polycystic ovary syndrome     Past Surgical History:  Procedure Laterality Date   NO PAST SURGERIES      No family history on file.  Social History   Tobacco Use   Smoking status: Never   Smokeless tobacco: Never  Vaping Use   Vaping Use: Never used  Substance Use Topics   Alcohol use: Not Currently    Comment: not since confirmed pregnancy   Drug use: Not Currently    Allergies: No Known Allergies  Medications Prior to Admission  Medication Sig Dispense Refill Last Dose   aspirin EC 81 MG tablet Take 1 tablet (81 mg total) by mouth daily. 60 tablet 2 08/31/2020   doxylamine, Sleep, (UNISOM) 25 MG tablet Take 25 mg by mouth at bedtime as needed.   08/30/2020   NIFEdipine (PROCARDIA-XL/NIFEDICAL-XL) 30 MG 24  hr tablet Take 1 tablet (30 mg total) by mouth daily. 60 tablet 2 08/31/2020   omeprazole (PRILOSEC) 40 MG capsule Take 40 mg by mouth daily.   08/31/2020   Prenatal Vit-Fe Fumarate-FA (PREPLUS) 27-1 MG TABS Take 1 tablet by mouth daily. 30 tablet 13 08/31/2020   vitamin B-6 (PYRIDOXINE) 25 MG tablet Take 25 mg by mouth daily.   08/31/2020   Butalbital-APAP-Caffeine 50-325-40 MG capsule Take 1-2 capsules by mouth every 6 (six) hours as needed for headache. 30 capsule 1     Review of Systems  Constitutional:  Positive for fatigue.  Respiratory:  Negative for shortness of breath.   Cardiovascular:  Negative for chest pain and palpitations.  Neurological:  Positive for headaches.  All other systems reviewed and are negative. Physical Exam   Blood pressure 127/71, pulse (!) 104, temperature 99.4 F (37.4 C), temperature source Oral, resp. rate 18, height 5\' 8"  (1.727 m), weight (!) 142 kg, last menstrual period 05/09/2020, SpO2 100 %.  Physical Exam Vitals and nursing note reviewed.  Constitutional:      Appearance: She is well-developed. She is ill-appearing.  Cardiovascular:  Rate and Rhythm: Normal rate.     Heart sounds: Normal heart sounds.  Pulmonary:     Effort: Pulmonary effort is normal.     Breath sounds: Normal breath sounds.  Abdominal:     Palpations: Abdomen is soft.  Skin:    General: Skin is warm and dry.     Capillary Refill: Capillary refill takes less than 2 seconds.  Neurological:     Mental Status: She is alert and oriented to person, place, and time. Mental status is at baseline.     Cranial Nerves: Cranial nerves are intact.     Sensory: Sensation is intact.     Motor: Motor function is intact.     Coordination: Coordination is intact.     Gait: Gait is intact.  Psychiatric:        Mood and Affect: Mood normal.        Speech: Speech normal.        Behavior: Behavior normal.    MAU Course  Procedures  Orders Placed This Encounter  Procedures   SARS  CORONAVIRUS 2 (TAT 6-24 HRS) Nasopharyngeal Nasopharyngeal Swab   Urinalysis, Routine w reflex microscopic Urine, Clean Catch   Insert peripheral IV   Discharge patient   Patient Vitals for the past 24 hrs:  BP Temp Temp src Pulse Resp SpO2 Height Weight  08/31/20 1819 115/67 98.2 F (36.8 C) -- 99 17 100 % -- --  08/31/20 1645 127/71 -- -- (!) 104 -- -- -- --  08/31/20 1617 137/81 99.4 F (37.4 C) Oral (!) 114 18 100 % 5\' 8"  (1.727 m) (!) 142 kg   Results for orders placed or performed during the hospital encounter of 08/31/20 (from the past 24 hour(s))  Urinalysis, Routine w reflex microscopic Urine, Clean Catch     Status: Abnormal   Collection Time: 08/31/20  4:36 PM  Result Value Ref Range   Color, Urine YELLOW YELLOW   APPearance HAZY (A) CLEAR   Specific Gravity, Urine 1.018 1.005 - 1.030   pH 5.0 5.0 - 8.0   Glucose, UA NEGATIVE NEGATIVE mg/dL   Hgb urine dipstick NEGATIVE NEGATIVE   Bilirubin Urine NEGATIVE NEGATIVE   Ketones, ur NEGATIVE NEGATIVE mg/dL   Protein, ur NEGATIVE NEGATIVE mg/dL   Nitrite NEGATIVE NEGATIVE   Leukocytes,Ua TRACE (A) NEGATIVE   RBC / HPF 0-5 0 - 5 RBC/hpf   WBC, UA 0-5 0 - 5 WBC/hpf   Bacteria, UA RARE (A) NONE SEEN   Squamous Epithelial / LPF 6-10 0 - 5   Mucus PRESENT    Meds ordered this encounter  Medications   lactated ringers bolus 1,000 mL   metoCLOPramide (REGLAN) injection 10 mg   diphenhydrAMINE (BENADRYL) injection 25 mg   dexamethasone (DECADRON) injection 10 mg   loratadine (CLARITIN) tablet 10 mg   Assessment and Plan  --25 y.o. G2P0010 at [redacted]w[redacted]d  --FHT 143 by Doppler --COVID exposure, currently no acute symptoms --COVID swab (6-24 hour) in work --Discharge home in stable condition with quarantine and symptom management guidance.  [redacted]w[redacted]d, CNM 08/31/2020, 7:19 PM

## 2020-09-01 ENCOUNTER — Telehealth: Payer: Self-pay

## 2020-09-01 LAB — SARS CORONAVIRUS 2 (TAT 6-24 HRS): SARS Coronavirus 2: NEGATIVE

## 2020-09-01 NOTE — Telephone Encounter (Signed)
Pt sent Mychart message yesterday requesting the office to call her. LVM for pt to c/b

## 2020-09-22 ENCOUNTER — Ambulatory Visit: Payer: BC Managed Care – PPO

## 2020-09-22 ENCOUNTER — Ambulatory Visit: Payer: BC Managed Care – PPO | Admitting: *Deleted

## 2020-09-22 ENCOUNTER — Other Ambulatory Visit: Payer: Self-pay

## 2020-09-22 ENCOUNTER — Encounter: Payer: Self-pay | Admitting: *Deleted

## 2020-09-22 ENCOUNTER — Other Ambulatory Visit: Payer: Self-pay | Admitting: *Deleted

## 2020-09-22 VITALS — BP 127/76 | HR 97

## 2020-09-22 DIAGNOSIS — Z6841 Body Mass Index (BMI) 40.0 and over, adult: Secondary | ICD-10-CM | POA: Diagnosis present

## 2020-09-22 DIAGNOSIS — O99210 Obesity complicating pregnancy, unspecified trimester: Secondary | ICD-10-CM

## 2020-09-22 DIAGNOSIS — O10919 Unspecified pre-existing hypertension complicating pregnancy, unspecified trimester: Secondary | ICD-10-CM | POA: Diagnosis present

## 2020-09-22 DIAGNOSIS — Z3A14 14 weeks gestation of pregnancy: Secondary | ICD-10-CM

## 2020-09-22 DIAGNOSIS — Z3402 Encounter for supervision of normal first pregnancy, second trimester: Secondary | ICD-10-CM | POA: Diagnosis present

## 2020-09-22 DIAGNOSIS — O10912 Unspecified pre-existing hypertension complicating pregnancy, second trimester: Secondary | ICD-10-CM

## 2020-09-30 ENCOUNTER — Ambulatory Visit (INDEPENDENT_AMBULATORY_CARE_PROVIDER_SITE_OTHER): Payer: BC Managed Care – PPO | Admitting: Obstetrics & Gynecology

## 2020-09-30 ENCOUNTER — Other Ambulatory Visit: Payer: Self-pay

## 2020-09-30 VITALS — BP 114/77 | HR 85 | Wt 316.3 lb

## 2020-09-30 DIAGNOSIS — R519 Headache, unspecified: Secondary | ICD-10-CM

## 2020-09-30 DIAGNOSIS — O10919 Unspecified pre-existing hypertension complicating pregnancy, unspecified trimester: Secondary | ICD-10-CM

## 2020-09-30 DIAGNOSIS — O99210 Obesity complicating pregnancy, unspecified trimester: Secondary | ICD-10-CM

## 2020-09-30 DIAGNOSIS — G8929 Other chronic pain: Secondary | ICD-10-CM | POA: Insufficient documentation

## 2020-09-30 DIAGNOSIS — Z3481 Encounter for supervision of other normal pregnancy, first trimester: Secondary | ICD-10-CM

## 2020-09-30 DIAGNOSIS — G44229 Chronic tension-type headache, not intractable: Secondary | ICD-10-CM

## 2020-09-30 DIAGNOSIS — K219 Gastro-esophageal reflux disease without esophagitis: Secondary | ICD-10-CM

## 2020-09-30 DIAGNOSIS — O99619 Diseases of the digestive system complicating pregnancy, unspecified trimester: Secondary | ICD-10-CM

## 2020-09-30 HISTORY — DX: Other chronic pain: G89.29

## 2020-09-30 HISTORY — DX: Headache, unspecified: R51.9

## 2020-09-30 MED ORDER — OMEPRAZOLE 40 MG PO CPDR: 40.0000 mg | DELAYED_RELEASE_CAPSULE | Freq: Two times a day (BID) | ORAL | 2 refills | Status: DC

## 2020-09-30 MED ORDER — METOCLOPRAMIDE HCL 5 MG PO TABS: 5.0000 mg | ORAL_TABLET | Freq: Three times a day (TID) | ORAL | 1 refills | Status: DC

## 2020-09-30 NOTE — Progress Notes (Signed)
Pt c/o daily headaches, and morning sickness.

## 2020-09-30 NOTE — Progress Notes (Signed)
   PRENATAL VISIT NOTE  Subjective:  Theresa Patterson is a 25 y.o. G2P0010 at [redacted]w[redacted]d being seen today for ongoing prenatal care.  She is currently monitored for the following issues for this high-risk pregnancy and has Encounter for supervision of normal pregnancy in first trimester; Obesity in pregnancy; Chronic hypertension during pregnancy; Morbid obesity (HCC); and Chronic headache on their problem list.  Patient reports headache, heartburn, nausea, and vomiting.  Contractions: Not present. Vag. Bleeding: None.  Movement: Present. Denies leaking of fluid.   The following portions of the patient's history were reviewed and updated as appropriate: allergies, current medications, past family history, past medical history, past social history, past surgical history and problem list.   Objective:   Vitals:   09/30/20 0903  BP: 114/77  Pulse: 85  Weight: (!) 316 lb 4.8 oz (143.5 kg)    Fetal Status: Fetal Heart Rate (bpm): 144 Fundal Height: 21 cm Movement: Present     General:  Alert, oriented and cooperative. Patient is in no acute distress.  Skin: Skin is warm and dry. No rash noted.   Cardiovascular: Normal heart rate noted  Respiratory: Normal respiratory effort, no problems with respiration noted  Abdomen: Soft, gravid, appropriate for gestational age.  Pain/Pressure: Absent     Pelvic: Cervical exam deferred        Extremities: Normal range of motion.     Mental Status: Normal mood and affect. Normal behavior. Normal judgment and thought content.   Assessment and Plan:  Pregnancy: G2P0010 at [redacted]w[redacted]d 1. Encounter for supervision of other normal pregnancy in first trimester screening - AFP, Serum, Open Spina Bifida - Genetic Screening - metoCLOPramide (REGLAN) 5 MG tablet; Take 1 tablet (5 mg total) by mouth 3 (three) times daily before meals.  Dispense: 90 tablet; Refill: 1  2. Obesity in pregnancy Body mass index is 48.09 kg/m.   3. Chronic hypertension during  pregnancy controlled  4. Chronic tension-type headache, not intractable On Fioricet with inadequate control  5. Gastroesophageal reflux in pregnancy Increase her Protonix and add Reglan - omeprazole (PRILOSEC) 40 MG capsule; Take 1 capsule (40 mg total) by mouth 2 (two) times daily.  Dispense: 60 capsule; Refill: 2  Preterm labor symptoms and general obstetric precautions including but not limited to vaginal bleeding, contractions, leaking of fluid and fetal movement were reviewed in detail with the patient. Please refer to After Visit Summary for other counseling recommendations.   Return in about 2 weeks (around 10/14/2020).  Future Appointments  Date Time Provider Department Center  10/20/2020 11:15 AM [facility] NURSE [facility]  10/20/2020 11:30 AM [facility] US3 [facility]    Scheryl Darter, MD

## 2020-10-02 LAB — AFP, SERUM, OPEN SPINA BIFIDA
AFP MoM: 0.66
AFP Value: 30.1 ng/mL
Gest. Age on Collection Date: 20.4 weeks
Maternal Age At EDD: 26.5 yr
OSBR Risk 1 IN: 10000
Test Results:: NEGATIVE
Weight: 315 [lb_av]

## 2020-10-10 ENCOUNTER — Inpatient Hospital Stay (HOSPITAL_COMMUNITY)
Admission: AD | Admit: 2020-10-10 | Discharge: 2020-10-10 | Disposition: A | Discharge status: Home / Self Care | Payer: BC Managed Care – PPO | Attending: Obstetrics and Gynecology | Admitting: Obstetrics and Gynecology

## 2020-10-10 ENCOUNTER — Encounter (HOSPITAL_COMMUNITY): Payer: Self-pay | Admitting: Obstetrics and Gynecology

## 2020-10-10 DIAGNOSIS — O26892 Other specified pregnancy related conditions, second trimester: Secondary | ICD-10-CM | POA: Diagnosis not present

## 2020-10-10 DIAGNOSIS — W19XXXA Unspecified fall, initial encounter: Secondary | ICD-10-CM | POA: Insufficient documentation

## 2020-10-10 DIAGNOSIS — O99282 Endocrine, nutritional and metabolic diseases complicating pregnancy, second trimester: Secondary | ICD-10-CM | POA: Diagnosis not present

## 2020-10-10 DIAGNOSIS — Z79899 Other long term (current) drug therapy: Secondary | ICD-10-CM | POA: Insufficient documentation

## 2020-10-10 DIAGNOSIS — Z3A22 22 weeks gestation of pregnancy: Secondary | ICD-10-CM | POA: Diagnosis not present

## 2020-10-10 DIAGNOSIS — E282 Polycystic ovarian syndrome: Secondary | ICD-10-CM | POA: Diagnosis not present

## 2020-10-10 DIAGNOSIS — M545 Low back pain, unspecified: Secondary | ICD-10-CM | POA: Diagnosis not present

## 2020-10-10 DIAGNOSIS — Z7982 Long term (current) use of aspirin: Secondary | ICD-10-CM | POA: Insufficient documentation

## 2020-10-10 DIAGNOSIS — O10912 Unspecified pre-existing hypertension complicating pregnancy, second trimester: Secondary | ICD-10-CM | POA: Insufficient documentation

## 2020-10-10 NOTE — Discharge Instructions (Signed)
You may take Tylenol as needed for pain relief. You may also use a heating pad or ice packs if this helps your symptoms. Return to MAU if you develop abdominal/pelvic pain or bleeding.

## 2020-10-10 NOTE — MAU Note (Signed)
Pt reports to MAU with c/o fall at home.  Pt states that she slipped on her fall and fell on bottom.  Pt states that she felt baby kick after the incident.  Pt reports that she felt some movement while in triage.  No LOF.

## 2020-10-10 NOTE — MAU Provider Note (Signed)
History    [id number]  Arrival date and time: 10/10/20 1828   Chief Complaint  Patient presents with   Fall   HPI Theresa Patterson is a 25 y.o. G2P0010 at [redacted]w[redacted]d by LMP who presents to MAU after a fall. Patient states that around 5 PM she was walking on her bathroom floor that was slippery and fell onto her backside. She has some mild residual low back pain since the fall but denies any abdominal pain, pelvic pressure, vaginal bleeding, or loss of fluid. She has not taken any medications since the fall. Her mom advised her to go get checked out since she fell and she is over [redacted] weeks pregnant.  Vaginal bleeding: No LOF: No Fetal Movement: Yes Contractions: No  O/Positive/-- (06/09 1049)  OB History     Gravida  2   Para      Term      Preterm      AB  1   Living         SAB  1   IAB      Ectopic      Multiple      Live Births              Past Medical History:  Diagnosis Date   Hypertension    Polycystic ovary syndrome     Past Surgical History:  Procedure Laterality Date   NO PAST SURGERIES      Family History  Problem Relation Age of Onset   Obesity Mother    Obesity Maternal Grandmother     Social History   Socioeconomic History   Marital status: Single    Spouse name: Not on file   Number of children: Not on file   Years of education: Not on file   Highest education level: Not on file  Occupational History   Not on file  Tobacco Use   Smoking status: Never   Smokeless tobacco: Never  Vaping Use   Vaping Use: Never used  Substance and Sexual Activity   Alcohol use: Not Currently    Comment: not since confirmed pregnancy   Drug use: Not Currently   Sexual activity: Yes    Partners: Male    Birth control/protection: None  Other Topics Concern   Not on file  Social History Narrative   Not on file   Social Determinants of Health   Financial Resource Strain: Not on file  Food Insecurity: Not on file  Transportation Needs:  Not on file  Physical Activity: Not on file  Stress: Not on file  Social Connections: Not on file  Intimate Partner Violence: Not on file    No Known Allergies  No current facility-administered medications on file prior to encounter.   Current Outpatient Medications on File Prior to Encounter  Medication Sig Dispense Refill   aspirin EC 81 MG tablet Take 1 tablet (81 mg total) by mouth daily. 60 tablet 2   Butalbital-APAP-Caffeine 50-325-40 MG capsule Take 1-2 capsules by mouth every 6 (six) hours as needed for headache. 30 capsule 1   doxylamine, Sleep, (UNISOM) 25 MG tablet Take 25 mg by mouth at bedtime as needed.     metoCLOPramide (REGLAN) 5 MG tablet Take 1 tablet (5 mg total) by mouth 3 (three) times daily before meals. 90 tablet 1   NIFEdipine (PROCARDIA-XL/NIFEDICAL-XL) 30 MG 24 hr tablet Take 1 tablet (30 mg total) by mouth daily. 60 tablet 2   omeprazole (PRILOSEC) 40 MG capsule Take 1  capsule (40 mg total) by mouth 2 (two) times daily. 60 capsule 2   Prenatal Vit-Fe Fumarate-FA (PREPLUS) 27-1 MG TABS Take 1 tablet by mouth daily. 30 tablet 13   vitamin B-6 (PYRIDOXINE) 25 MG tablet Take 25 mg by mouth daily.      ROS Pertinent positives and negative per HPI, all others reviewed and negative  Physical Exam   BP 138/79 (BP Location: Right Arm)   Pulse 98   Temp 98.1 F (36.7 C) (Oral)   Resp 18   Ht 5\' 8"  (1.727 m)   Wt (!) 145.6 kg   LMP 05/09/2020   SpO2 100%   BMI 48.81 kg/m   Physical Exam Constitutional:      General: She is not in acute distress.    Appearance: Normal appearance.  HENT:     Head: Normocephalic and atraumatic.  Eyes:     Conjunctiva/sclera: Conjunctivae normal.  Cardiovascular:     Rate and Rhythm: Normal rate.  Pulmonary:     Effort: Pulmonary effort is normal. No respiratory distress.  Abdominal:     Palpations: Abdomen is soft.     Tenderness: There is no abdominal tenderness.  Musculoskeletal:        General: Normal range of  motion.     Cervical back: Normal range of motion.     Right lower leg: No edema.     Left lower leg: No edema.     Comments: Nontender to palpation over spinous processes. Mild tenderness to palpation over lumbar paraspinal musculature.  Skin:    General: Skin is warm and dry.  Neurological:     General: No focal deficit present.     Mental Status: She is alert and oriented to person, place, and time.  Psychiatric:        Mood and Affect: Mood normal.        Behavior: Behavior normal.   FHT: 140 bpm  Labs No results found for this or any previous visit (from the past 24 hour(s)).  Imaging No results found.  MAU Course Assessment and Plan  Procedures Lab Orders  No laboratory test(s) ordered today   No orders of the defined types were placed in this encounter.  Imaging Orders  No imaging studies ordered today   MDM Patient presents to the MAU at [redacted] weeks gestation after a backwards fall. No abdominal or pelvic pain. Exam as above reassuring and only notable for mild tenderness over lumbar paraspinal musculature, likely due to muscle spasm after her fall. No contractions, LOF, or vaginal bleeding since fall. Normal FHT in MAU. Ultimately deemed patient stable for discharge home. Recommended Tylenol and heating pad PRN for symptomatic relief. Strict return precautions reviewed and patient voiced understanding.  05/11/2020, MD Obstetrics Fellow 10/10/20 7:28 PM

## 2020-10-14 ENCOUNTER — Ambulatory Visit (INDEPENDENT_AMBULATORY_CARE_PROVIDER_SITE_OTHER): Payer: BC Managed Care – PPO | Admitting: Obstetrics and Gynecology

## 2020-10-14 ENCOUNTER — Other Ambulatory Visit: Payer: Self-pay

## 2020-10-14 VITALS — BP 123/83 | HR 93 | Wt 322.9 lb

## 2020-10-14 DIAGNOSIS — Z6841 Body Mass Index (BMI) 40.0 and over, adult: Secondary | ICD-10-CM

## 2020-10-14 DIAGNOSIS — O10919 Unspecified pre-existing hypertension complicating pregnancy, unspecified trimester: Secondary | ICD-10-CM

## 2020-10-14 DIAGNOSIS — Z3A22 22 weeks gestation of pregnancy: Secondary | ICD-10-CM

## 2020-10-14 DIAGNOSIS — Z3481 Encounter for supervision of other normal pregnancy, first trimester: Secondary | ICD-10-CM

## 2020-10-14 DIAGNOSIS — O99210 Obesity complicating pregnancy, unspecified trimester: Secondary | ICD-10-CM

## 2020-10-14 NOTE — Progress Notes (Signed)
Pt reports fetal movement, denies pain.  

## 2020-10-14 NOTE — Progress Notes (Signed)
   PRENATAL VISIT NOTE  Subjective:  Theresa Patterson is a 25 y.o. G2P0010 at [redacted]w[redacted]d being seen today for ongoing prenatal care.  She is currently monitored for the following issues for this high-risk pregnancy and has Encounter for supervision of normal pregnancy in first trimester; Obesity in pregnancy; Chronic hypertension during pregnancy; Morbid obesity (HCC); Chronic headache; [redacted] weeks gestation of pregnancy; and BMI 45.0-49.9, adult (HCC) on their problem list.  Patient doing well with no acute concerns today. She reports no complaints.  Contractions: Not present. Vag. Bleeding: None.  Movement: Present. Denies leaking of fluid.   The following portions of the patient's history were reviewed and updated as appropriate: allergies, current medications, past family history, past medical history, past social history, past surgical history and problem list. Problem list updated.  Objective:   Vitals:   10/14/20 1427  BP: 123/83  Pulse: 93  Weight: (!) 322 lb 14.4 oz (146.5 kg)    Fetal Status: Fetal Heart Rate (bpm): 140 Fundal Height: 22 cm Movement: Present     General:  Alert, oriented and cooperative. Patient is in no acute distress.  Skin: Skin is warm and dry. No rash noted.   Cardiovascular: Normal heart rate noted  Respiratory: Normal respiratory effort, no problems with respiration noted  Abdomen: Soft, gravid, appropriate for gestational age.  Pain/Pressure: Absent     Pelvic: Cervical exam deferred        Extremities: Normal range of motion.  Edema: None  Mental Status:  Normal mood and affect. Normal behavior. Normal judgment and thought content.   Assessment and Plan:  Pregnancy: G2P0010 at [redacted]w[redacted]d  1. Encounter for supervision of other normal pregnancy in first trimester Continue routine care, 2 hour GTT  visit  2. [redacted] weeks gestation of pregnancy  3. Chronic hypertension during pregnancy BP well controlled with procardia  4. Obesity in pregnancy   5. BMI  45.0-49.9, adult (HCC)   Preterm labor symptoms and general obstetric precautions including but not limited to vaginal bleeding, contractions, leaking of fluid and fetal movement were reviewed in detail with the patient.  Please refer to After Visit Summary for other counseling recommendations.   Return in about 4 weeks (around 11/11/2020) for [facility], in person, 2 hr GTT, 3rd trim labs.   Mariel Aloe, MD Faculty Attending Center [facility]

## 2020-10-15 ENCOUNTER — Other Ambulatory Visit: Payer: Self-pay

## 2020-10-15 DIAGNOSIS — D563 Thalassemia minor: Secondary | ICD-10-CM | POA: Insufficient documentation

## 2020-10-15 DIAGNOSIS — Z3481 Encounter for supervision of other normal pregnancy, first trimester: Secondary | ICD-10-CM

## 2020-10-15 HISTORY — DX: Thalassemia minor: D56.3

## 2020-10-20 ENCOUNTER — Ambulatory Visit (HOSPITAL_BASED_OUTPATIENT_CLINIC_OR_DEPARTMENT_OTHER): Payer: BC Managed Care – PPO | Admitting: Obstetrics and Gynecology

## 2020-10-20 ENCOUNTER — Other Ambulatory Visit: Payer: Self-pay

## 2020-10-20 ENCOUNTER — Encounter: Payer: Self-pay | Admitting: *Deleted

## 2020-10-20 ENCOUNTER — Ambulatory Visit: Payer: BC Managed Care – PPO | Admitting: *Deleted

## 2020-10-20 ENCOUNTER — Ambulatory Visit: Payer: BC Managed Care – PPO | Attending: Obstetrics and Gynecology

## 2020-10-20 ENCOUNTER — Other Ambulatory Visit: Payer: Self-pay | Admitting: *Deleted

## 2020-10-20 VITALS — BP 134/68 | HR 96

## 2020-10-20 DIAGNOSIS — Z3A23 23 weeks gestation of pregnancy: Secondary | ICD-10-CM

## 2020-10-20 DIAGNOSIS — Z362 Encounter for other antenatal screening follow-up: Secondary | ICD-10-CM

## 2020-10-20 DIAGNOSIS — O99212 Obesity complicating pregnancy, second trimester: Secondary | ICD-10-CM

## 2020-10-20 DIAGNOSIS — D563 Thalassemia minor: Secondary | ICD-10-CM | POA: Diagnosis present

## 2020-10-20 DIAGNOSIS — O10012 Pre-existing essential hypertension complicating pregnancy, second trimester: Secondary | ICD-10-CM

## 2020-10-20 DIAGNOSIS — O10912 Unspecified pre-existing hypertension complicating pregnancy, second trimester: Secondary | ICD-10-CM | POA: Diagnosis not present

## 2020-10-20 NOTE — Progress Notes (Signed)
Maternal-Fetal Medicine   Name: Theresa Patterson DOB: 07/18/1985 MRN: [id number] Referring Provider: Gerrit Heck, CNM  I had the pleasure of seeing [name] today at [facility]. She is G1 P0 at 23-weeks' gestation and is here for fetal anatomy scan.  Past medical history significant for hypertension diagnosed about 2 years ago.  Patient was taking metoprolol before pregnancy.  Now, she takes nifedipine XL 30 mg daily and her blood pressures are reportedly well controlled.  She does not have diabetes or any other chronic medical conditions.  Past surgical history: Nil of note. Allergies: No known drug allergies. Social history: Denies tobacco or drug or alcohol use.  She lives with a partner who is African-American and he is in good health.  Both patient and partner do not have sickle cell trait. GYN history: No history of abnormal Pap smears or cervical surgeries.  No history of breast disease.  Prenatal course: On cell free fetal DNA screening, she had low risk for fetal aneuploidies.  MSAFP screening showed low risk for open neural tube defects.  Carrier screening showed she is a silent carrier for alpha thalassemia. Fetal anatomical survey performed at her last visit was normal. Blood pressure today at her office is 134/68 mmHg.  Pulse 96/minute.  Prepregnancy BMI 48.  Ultrasound Patient returned for completion of fetal anatomy.  Fetal growth is appropriate for gestational age.  Amniotic fluid is normal good fetal activity seen.  Fetal comical survey was completed and appears normal.  Our concerns include: Chronic hypertension -I counseled the patient on chronic hypertension and pregnancy outcomes. -Adverse outcomes from severe uncontrolled hypertension include maternal stroke, endorgan damage, coagulation disturbances and placental abruption. -Superimposed preeclampsia can occur in up to 30% of cases. -Discussed the safety profile of commonly used  antihypertensive medications including nifedipine and labetalol. -I discussed the benefit of low-dose aspirin prophylaxis in delaying or preventing preeclampsia. -Ultrasound protocol including serial fetal growth assessments and weekly antenatal testing from [redacted] weeks gestation were discussed.  Alpha-thalassemia Silent carrier  Patient is a silent carrier for alpha thalassemia (aa/a-).  I discussed genetics and transmission.  I encouraged genetic counseling and partner screening.  Patient will discuss with a partner and decide.  Recommendations -An appointment was made for her to return in 4 weeks for fetal growth assessment. -Fetal growth assessments every 4 weeks. -Weekly BPP from [redacted] weeks gestation till delivery. -Delivery at 38 to [redacted] weeks gestation provider her blood pressures are within normal range.  Thank you for consultation.  If you have any questions or concerns, please contact me the [facility].  Consultation including face-to-face (more than 50%) counseling 30 minutes.

## 2020-10-23 ENCOUNTER — Other Ambulatory Visit: Payer: Self-pay

## 2020-10-23 ENCOUNTER — Inpatient Hospital Stay (HOSPITAL_COMMUNITY)
Admission: AD | Admit: 2020-10-23 | Discharge: 2020-10-23 | Disposition: A | Discharge status: Home / Self Care | Payer: BC Managed Care – PPO | Attending: Family Medicine | Admitting: Family Medicine

## 2020-10-23 DIAGNOSIS — Z3689 Encounter for other specified antenatal screening: Secondary | ICD-10-CM

## 2020-10-23 DIAGNOSIS — Z79899 Other long term (current) drug therapy: Secondary | ICD-10-CM | POA: Diagnosis not present

## 2020-10-23 DIAGNOSIS — O26892 Other specified pregnancy related conditions, second trimester: Secondary | ICD-10-CM | POA: Insufficient documentation

## 2020-10-23 DIAGNOSIS — Z3A23 23 weeks gestation of pregnancy: Secondary | ICD-10-CM | POA: Diagnosis not present

## 2020-10-23 DIAGNOSIS — R1030 Lower abdominal pain, unspecified: Secondary | ICD-10-CM | POA: Insufficient documentation

## 2020-10-23 DIAGNOSIS — M549 Dorsalgia, unspecified: Secondary | ICD-10-CM | POA: Diagnosis not present

## 2020-10-23 DIAGNOSIS — Z3A24 24 weeks gestation of pregnancy: Secondary | ICD-10-CM | POA: Insufficient documentation

## 2020-10-23 DIAGNOSIS — O99891 Other specified diseases and conditions complicating pregnancy: Secondary | ICD-10-CM | POA: Diagnosis not present

## 2020-10-23 LAB — URINALYSIS, ROUTINE W REFLEX MICROSCOPIC
Bilirubin Urine: NEGATIVE
Glucose, UA: NEGATIVE mg/dL
Hgb urine dipstick: NEGATIVE
Ketones, ur: 5 mg/dL — AB
Nitrite: NEGATIVE
Protein, ur: NEGATIVE mg/dL
Specific Gravity, Urine: 1.033 — ABNORMAL HIGH (ref 1.005–1.030)
pH: 6 (ref 5.0–8.0)

## 2020-10-23 LAB — WET PREP, GENITAL
Clue Cells Wet Prep HPF POC: NONE SEEN
Sperm: NONE SEEN
Trich, Wet Prep: NONE SEEN
Yeast Wet Prep HPF POC: NONE SEEN

## 2020-10-23 MED ORDER — CYCLOBENZAPRINE HCL 10 MG PO TABS: 10.0000 mg | ORAL_TABLET | Freq: Two times a day (BID) | ORAL | 0 refills | Status: DC | PRN

## 2020-10-23 MED ORDER — CYCLOBENZAPRINE HCL 5 MG PO TABS
10.0000 mg | ORAL_TABLET | Freq: Once | ORAL | Status: AC
  Administered 2020-10-23: 10 mg via ORAL
  Filled 2020-10-23: qty 2

## 2020-10-23 MED ORDER — ACETAMINOPHEN 325 MG PO TABS
650.0000 mg | ORAL_TABLET | Freq: Once | ORAL | Status: AC
  Administered 2020-10-23: 650 mg via ORAL
  Filled 2020-10-23: qty 2

## 2020-10-23 NOTE — MAU Provider Note (Signed)
History     CSN: [id number]  Arrival date and time: 10/23/20 1932   Event Date/Time   First Provider Initiated Contact with Patient 10/23/20 2143      Chief Complaint  Patient presents with   Abdominal Pain   Back Pain   Theresa Patterson is a 25 y.o. G2P0010 at [redacted]w[redacted]d who receives care at [facility].  She presents today for Abdominal Pain and Back Pain.  She states the pain started last night while working and improved with tylenol dosing.  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 She states the back pain returned today, but was unresponsive with treatment. She reports the pain is located in her lower back on the left side and is a constant sharp pain.  She also reports lower abdominal pain that is sharp and lasts "a few seconds."  Patient endorses fetal movement and denies vaginal concerns including discharge, leaking, or bleeding.     OB History     Gravida  2   Para      Term      Preterm      AB  1   Living         SAB  1   IAB      Ectopic      Multiple      Live Births              Past Medical History:  Diagnosis Date   Hypertension    Polycystic ovary syndrome     Past Surgical History:  Procedure Laterality Date   NO PAST SURGERIES      Family History  Problem Relation Age of Onset   Obesity Mother    Obesity Maternal Grandmother     Social History   Tobacco Use   Smoking status: Never   Smokeless tobacco: Never  Vaping Use   Vaping Use: Never used  Substance Use Topics   Alcohol use: Not Currently    Comment: not since confirmed pregnancy   Drug use: Not Currently    Allergies: No Known Allergies  Medications Prior to Admission  Medication Sig Dispense Refill Last Dose   aspirin EC 81 MG tablet Take 1 tablet (81 mg total) by mouth daily. 60 tablet 2    Butalbital-APAP-Caffeine 50-325-40 MG capsule Take 1-2 capsules by mouth every 6 (six) hours as needed for headache. 30  capsule 1    doxylamine, Sleep, (UNISOM) 25 MG tablet Take 25 mg by mouth at bedtime as needed.      metoCLOPramide (REGLAN) 5 MG tablet Take 1 tablet (5 mg total) by mouth 3 (three) times daily before meals. 90 tablet 1    NIFEdipine (PROCARDIA-XL/NIFEDICAL-XL) 30 MG 24 hr tablet Take 1 tablet (30 mg total) by mouth daily. 60 tablet 2    omeprazole (PRILOSEC) 40 MG capsule Take 1 capsule (40 mg total) by mouth 2 (two) times daily. 60 capsule 2    Prenatal Vit-Fe Fumarate-FA (PREPLUS) 27-1 MG TABS Take 1 tablet by mouth daily. 30 tablet 13    vitamin B-6 (PYRIDOXINE) 25 MG tablet Take 25 mg by mouth daily.       Review of Systems  Gastrointestinal:  Positive for nausea (None currently). Negative for abdominal pain, constipation, diarrhea and vomiting.  Genitourinary:  Negative for difficulty urinating, dysuria, vaginal bleeding and vaginal discharge.  Musculoskeletal:  Positive for back pain.  Neurological:  Negative for dizziness, light-headedness and headaches.  Physical Exam   Blood pressure 131/64, pulse (!) 103, temperature 98.3 F (36.8 C), temperature source Oral, resp. rate 17, height 5\' 8"  (1.727 m), weight (!) 146 kg, last menstrual period 05/09/2020, SpO2 100 %.  Physical Exam Vitals reviewed. Exam conducted with a chaperone present.  Constitutional:      Appearance: She is well-developed.  HENT:     Head: Normocephalic and atraumatic.  Eyes:     Conjunctiva/sclera: Conjunctivae normal.  Cardiovascular:     Rate and Rhythm: Normal rate.  Pulmonary:     Effort: Pulmonary effort is normal.  Abdominal:     Tenderness: There is no abdominal tenderness.  Genitourinary:    Comments: Speculum Exam: -Normal External Genitalia: Non tender, no apparent discharge at introitus.  -Vaginal Vault: Pink mucosa with good rugae. Scant amt milky white discharge -wet prep collected -Cervix:Pink, no lesions, cysts, or polyps.  Appears closed. No active bleeding from os-GC/CT  collected -Bimanual Exam: Dilation: Closed Cervical Position: Posterior Exam by:: J.Kayleanna Lorman,CNM Skin:    General: Skin is warm and dry.  Neurological:     Mental Status: She is alert and oriented to person, place, and time.  Psychiatric:        Mood and Affect: Mood normal.        Behavior: Behavior normal.        Thought Content: Thought content normal.    Fetal Assessment 135 bpm, Mod Var, -Decels, -Accels Toco: None  MAU Course   Results for orders placed or performed during the hospital encounter of 10/23/20 (from the past 24 hour(s))  Urinalysis, Routine w reflex microscopic Urine, Clean Catch     Status: Abnormal   Collection Time: 10/23/20  8:41 PM  Result Value Ref Range   Color, Urine YELLOW YELLOW   APPearance CLOUDY (A) CLEAR   Specific Gravity, Urine 1.033 (H) 1.005 - 1.030   pH 6.0 5.0 - 8.0   Glucose, UA NEGATIVE NEGATIVE mg/dL   Hgb urine dipstick NEGATIVE NEGATIVE   Bilirubin Urine NEGATIVE NEGATIVE   Ketones, ur 5 (A) NEGATIVE mg/dL   Protein, ur NEGATIVE NEGATIVE mg/dL   Nitrite NEGATIVE NEGATIVE   Leukocytes,Ua MODERATE (A) NEGATIVE   RBC / HPF 0-5 0 - 5 RBC/hpf   WBC, UA 21-50 0 - 5 WBC/hpf   Bacteria, UA FEW (A) NONE SEEN   Squamous Epithelial / LPF 21-50 0 - 5   Mucus PRESENT    Sperm, UA PRESENT   Wet prep, genital     Status: Abnormal   Collection Time: 10/23/20  9:48 PM  Result Value Ref Range   Yeast Wet Prep HPF POC NONE SEEN NONE SEEN   Trich, Wet Prep NONE SEEN NONE SEEN   Clue Cells Wet Prep HPF POC NONE SEEN NONE SEEN   WBC, Wet Prep HPF POC MODERATE (A) NONE SEEN   Sperm NONE SEEN    No results found.  MDM PE EFM Labs: UC, Wet prep Muscle Relaxant Assessment and Plan  25 year old G2P0010  SIUP at 24 weeks Cat I FT Back Pain  -POC Reviewed. -Exam performed and findings discussed. -Cultures collected and pending.  -Discussed usage of maternity belt and compression stockings especially while working. -Educated on usage of  muscle relaxant for pregnancy related back pain. -Patient agree and Flexeril 10mg  ordered. -UA with +bacteria, +leuks.  Will send culture.  -Will await results and reassess.   30 MSN, CNM 10/23/2020, 9:43 PM   Reassessment (10:56 PM)  -Patient  reports improvement in symptoms with flexeril dosing.  -Will send script to pharmacy. -Instructed to not take medications prior to working as it could cause some drowsiness. -Patient without questions or concerns. -Instructed to keep appt as scheduled.  -Encouraged to call or return to MAU if symptoms worsen or with the onset of new symptoms. -Discharged to home in improved condition.  Cherre Robins MSN, CNM Advanced Practice Provider, Center for [facility]   Addendum 8:18 AM  -Chart reviewed and UC not ordered as planned. -Due to delay unable to order via Epic. -Paper requisition sent to microbiology to add-on UC for evaluation.  Cherre Robins MSN, CNM Advanced Practice Provider, Center for [employer]

## 2020-10-23 NOTE — MAU Note (Signed)
..  Theresa Patterson is a 25 y.o. at [redacted]w[redacted]d here in MAU reporting: Left sided sharp lower abdominal and back pain. Denies contractions, leaking of fluid or vaginal bleeding. +FM Onset of complaint: last night took tylenol and it helped but returned this morning and tylenol did not help this time. Pain score: 7/10 Vitals:   10/23/20 2008 10/23/20 2010  BP:  131/64  Pulse:  (!) 103  Resp:  17  Temp:  98.3 F (36.8 C)  SpO2: 100%      FHT:135 Lab orders placed from triage: UA

## 2020-10-25 LAB — GC/CHLAMYDIA PROBE AMP (~~LOC~~) NOT AT ARMC
Chlamydia: NEGATIVE
Comment: NEGATIVE
Comment: NORMAL
Neisseria Gonorrhea: NEGATIVE

## 2020-10-25 LAB — URINE CULTURE

## 2020-10-27 ENCOUNTER — Other Ambulatory Visit: Payer: Self-pay

## 2020-10-27 ENCOUNTER — Inpatient Hospital Stay (HOSPITAL_COMMUNITY)
Admission: AD | Admit: 2020-10-27 | Discharge: 2020-10-27 | Disposition: A | Discharge status: Home / Self Care | Payer: BC Managed Care – PPO | Attending: Obstetrics and Gynecology | Admitting: Obstetrics and Gynecology

## 2020-10-27 ENCOUNTER — Encounter (HOSPITAL_COMMUNITY): Payer: Self-pay | Admitting: Obstetrics and Gynecology

## 2020-10-27 DIAGNOSIS — O26892 Other specified pregnancy related conditions, second trimester: Secondary | ICD-10-CM | POA: Diagnosis not present

## 2020-10-27 DIAGNOSIS — N898 Other specified noninflammatory disorders of vagina: Secondary | ICD-10-CM | POA: Diagnosis not present

## 2020-10-27 DIAGNOSIS — Z79899 Other long term (current) drug therapy: Secondary | ICD-10-CM | POA: Diagnosis not present

## 2020-10-27 DIAGNOSIS — O10012 Pre-existing essential hypertension complicating pregnancy, second trimester: Secondary | ICD-10-CM

## 2020-10-27 DIAGNOSIS — E282 Polycystic ovarian syndrome: Secondary | ICD-10-CM | POA: Insufficient documentation

## 2020-10-27 DIAGNOSIS — Z3A24 24 weeks gestation of pregnancy: Secondary | ICD-10-CM | POA: Insufficient documentation

## 2020-10-27 DIAGNOSIS — Z7982 Long term (current) use of aspirin: Secondary | ICD-10-CM | POA: Diagnosis not present

## 2020-10-27 DIAGNOSIS — O10919 Unspecified pre-existing hypertension complicating pregnancy, unspecified trimester: Secondary | ICD-10-CM

## 2020-10-27 DIAGNOSIS — O10912 Unspecified pre-existing hypertension complicating pregnancy, second trimester: Secondary | ICD-10-CM | POA: Diagnosis present

## 2020-10-27 DIAGNOSIS — O99282 Endocrine, nutritional and metabolic diseases complicating pregnancy, second trimester: Secondary | ICD-10-CM | POA: Diagnosis not present

## 2020-10-27 LAB — URINALYSIS, ROUTINE W REFLEX MICROSCOPIC
Bilirubin Urine: NEGATIVE
Glucose, UA: NEGATIVE mg/dL
Hgb urine dipstick: NEGATIVE
Ketones, ur: 20 mg/dL — AB
Nitrite: NEGATIVE
Protein, ur: NEGATIVE mg/dL
Specific Gravity, Urine: 1.018 (ref 1.005–1.030)
pH: 6 (ref 5.0–8.0)

## 2020-10-27 LAB — POCT FERN TEST: POCT Fern Test: NEGATIVE

## 2020-10-27 LAB — AMNISURE RUPTURE OF MEMBRANE (ROM) NOT AT ARMC: Amnisure ROM: NEGATIVE

## 2020-10-27 NOTE — MAU Provider Note (Signed)
Chief Complaint:  Pelvic Pain    HPI: Theresa Patterson is a 25 y.o. G2P0010 at [redacted]w[redacted]d who presents to maternity admissions reporting pelvic pressure and leaking fluid. Patient reports she was evaluated Saturday for pelvic pressure and back pain. Was told to use a belly band. Patient reports while at work she started having lower abdominal cramping and pelvic pain. Has been wearing belly band only at work without much relief. Reports around 4:15pm she felt "wet" down there. Went to the bathroom and noticed a wet spot on her underwear. Called the office and was told to put a panty-liner on and to come be evaluated. Patient says her panty-liner was damp, but not drenched. Has not had any leakage of fluid since her arrival. She denies itching, odor, vaginal bleeding, urinary s/s or contractions. Endorses active fetal movement  Pregnancy Course:   Past Medical History:  Diagnosis Date   Hypertension    Polycystic ovary syndrome    OB History  Gravida Para Term Preterm AB Living  2       1    SAB IAB Ectopic Multiple Live Births  1            # Outcome Date GA Lbr Len/2nd Weight Sex Delivery Anes PTL Lv  2 Current           1 SAB            Past Surgical History:  Procedure Laterality Date   NO PAST SURGERIES     Family History  Problem Relation Age of Onset   Obesity Mother    Obesity Maternal Grandmother    Social History   Tobacco Use   Smoking status: Never   Smokeless tobacco: Never  Vaping Use   Vaping Use: Never used  Substance Use Topics   Alcohol use: Not Currently    Comment: not since confirmed pregnancy   Drug use: Not Currently   No Known Allergies Medications Prior to Admission  Medication Sig Dispense Refill Last Dose   aspirin EC 81 MG tablet Take 1 tablet (81 mg total) by mouth daily. 60 tablet 2 10/27/2020   Butalbital-APAP-Caffeine 50-325-40 MG capsule Take 1-2 capsules by mouth every 6 (six) hours as needed for headache. 30 capsule 1 10/27/2020    cyclobenzaprine (FLEXERIL) 10 MG tablet Take 1 tablet (10 mg total) by mouth 2 (two) times daily as needed for muscle spasms. 20 tablet 0 10/26/2020   doxylamine, Sleep, (UNISOM) 25 MG tablet Take 25 mg by mouth at bedtime as needed.   Past Month   metoCLOPramide (REGLAN) 5 MG tablet Take 1 tablet (5 mg total) by mouth 3 (three) times daily before meals. 90 tablet 1 10/27/2020   NIFEdipine (PROCARDIA-XL/NIFEDICAL-XL) 30 MG 24 hr tablet Take 1 tablet (30 mg total) by mouth daily. 60 tablet 2 10/27/2020   omeprazole (PRILOSEC) 40 MG capsule Take 1 capsule (40 mg total) by mouth 2 (two) times daily. 60 capsule 2 10/27/2020   Prenatal Vit-Fe Fumarate-FA (PREPLUS) 27-1 MG TABS Take 1 tablet by mouth daily. 30 tablet 13 10/27/2020   vitamin B-6 (PYRIDOXINE) 25 MG tablet Take 25 mg by mouth daily.   Past Month    I have reviewed patient's Past Medical Hx, Surgical Hx, Family Hx, Social Hx, medications and allergies.   ROS:  Review of Systems  Constitutional: Negative.   Respiratory: Negative.    Cardiovascular: Negative.   Gastrointestinal: Negative.   Genitourinary:  Positive for pelvic pain and vaginal discharge. Negative for  dysuria and vaginal bleeding.  Musculoskeletal:  Positive for back pain.  Neurological: Negative.    Physical Exam  Patient Vitals for the past 24 hrs:  BP Temp Temp src Pulse Resp SpO2 Height Weight  10/27/20 2210 (!) 148/72 98.4 F (36.9 C) Oral 89 18 99 % -- --  10/27/20 2015 134/70 -- -- 90 -- -- -- --  10/27/20 1935 (!) 144/79 -- -- 93 -- -- -- --  10/27/20 1933 -- 98.3 F (36.8 C) -- -- 18 -- 5\' 8"  (1.727 m) (!) 145.6 kg   Constitutional: well-developed, well-nourished female in no acute distress.  Cardiovascular: normal rate Respiratory: normal effort GI: abd soft, non-tender, gravid  MS: extremities nontender, no edema, normal ROM Neurologic: alert and oriented x 4.  GU: neg CVAT. Pelvic: NEFG, small amount of clear, watery discharge, no pooling, no blood,  cervix clean without lesions/masses, visually closed, no CMT Cervix: Closed Exam by: , CNM  FHT: Baseline 135 bpm, moderate variability, 10x10 accelerations present, no decelerations Toco: quiet    Labs: Results for orders placed or performed during the hospital encounter of 10/27/20 (from the past 24 hour(s))  Urinalysis, Routine w reflex microscopic Urine, Clean Catch     Status: Abnormal   Collection Time: 10/27/20  7:43 PM  Result Value Ref Range   Color, Urine YELLOW YELLOW   APPearance HAZY (A) CLEAR   Specific Gravity, Urine 1.018 1.005 - 1.030   pH 6.0 5.0 - 8.0   Glucose, UA NEGATIVE NEGATIVE mg/dL   Hgb urine dipstick NEGATIVE NEGATIVE   Bilirubin Urine NEGATIVE NEGATIVE   Ketones, ur 20 (A) NEGATIVE mg/dL   Protein, ur NEGATIVE NEGATIVE mg/dL   Nitrite NEGATIVE NEGATIVE   Leukocytes,Ua TRACE (A) NEGATIVE   RBC / HPF 0-5 0 - 5 RBC/hpf   WBC, UA 0-5 0 - 5 WBC/hpf   Bacteria, UA RARE (A) NONE SEEN   Squamous Epithelial / LPF 11-20 0 - 5   Mucus PRESENT   Fern Test     Status: None   Collection Time: 10/27/20  8:30 PM  Result Value Ref Range   POCT Fern Test Negative = intact amniotic membranes   Amnisure rupture of membrane (rom)not at [facility]     Status: None   Collection Time: 10/27/20  8:49 PM  Result Value Ref Range   Amnisure ROM NEGATIVE     Imaging:    MAU Course: Orders Placed This Encounter  Procedures   Urinalysis, Routine w reflex microscopic Urine, Clean Catch   Amnisure rupture of membrane (rom)not at [facility] Test   Discharge patient   No orders of the defined types were placed in this encounter.   MDM: UA unremarkable Fern slide negative Amnisure negative Cervix closed/thick NST reactive and reassuring for gestational age. Toco quiet Patient continues to deny contractions. Recommend wearing support belt all day every day except for showering or sleeping instead of wearing only while at work. Patient may also use  heat/ice, stretching exercises and Tylenol prn.  cHTN on Procardia, BP's consistent with baseline BP's, normotensive to mild range. Asymptomatic. Strict return precautions   Assessment: 1. [redacted] weeks gestation of pregnancy   2. Vaginal discharge during pregnancy in second trimester   3. Chronic hypertension affecting pregnancy     Plan: Discharge home in stable condition Preterm labor precautions reviewed Strict return precautions reviewed Patient to keep appointment as scheduled Return to MAU as needed    Follow-up Information  [facility] Follow up.   Specialty: Obstetrics and Gynecology Why: as scheduled. Return to MAU as needed. Contact information: [address] [place] [address] [phone]                Allergies as of 10/27/2020   No Known Allergies      Medication List     TAKE these medications    aspirin EC 81 MG tablet Take 1 tablet (81 mg total) by mouth daily.   Butalbital-APAP-Caffeine 50-325-40 MG capsule Take 1-2 capsules by mouth every 6 (six) hours as needed for headache.   cyclobenzaprine 10 MG tablet Commonly known as: FLEXERIL Take 1 tablet (10 mg total) by mouth 2 (two) times daily as needed for muscle spasms.   doxylamine (Sleep) 25 MG tablet Commonly known as: UNISOM Take 25 mg by mouth at bedtime as needed.   metoCLOPramide 5 MG tablet Commonly known as: Reglan Take 1 tablet (5 mg total) by mouth 3 (three) times daily before meals.   NIFEdipine 30 MG 24 hr tablet Commonly known as: PROCARDIA-XL/NIFEDICAL-XL Take 1 tablet (30 mg total) by mouth daily.   omeprazole 40 MG capsule Commonly known as: PRILOSEC Take 1 capsule (40 mg total) by mouth 2 (two) times daily.   PrePLUS 27-1 MG Tabs Take 1 tablet by mouth daily.   vitamin B-6 25 MG tablet Commonly known as: pyridOXINE Take 25 mg by mouth daily.         Camelia Eng, MSN, CNM 10/27/2020 9:56  PM

## 2020-10-27 NOTE — MAU Note (Signed)
Having a lot of pressure in lower abd and pelvic area. Had on support belt. Had occ sharp pains. I stood up and felt wet down there. Went to BR and underwear had wet spot on it. Leaked another time so put on panty liner and it has been damp but not really wet. No color to the d/c

## 2020-11-11 ENCOUNTER — Other Ambulatory Visit: Payer: Self-pay

## 2020-11-11 ENCOUNTER — Ambulatory Visit (INDEPENDENT_AMBULATORY_CARE_PROVIDER_SITE_OTHER): Payer: BC Managed Care – PPO | Admitting: Obstetrics and Gynecology

## 2020-11-11 ENCOUNTER — Encounter: Payer: Self-pay | Admitting: Obstetrics and Gynecology

## 2020-11-11 ENCOUNTER — Other Ambulatory Visit: Payer: BC Managed Care – PPO

## 2020-11-11 VITALS — BP 134/81 | HR 98 | Wt 320.0 lb

## 2020-11-11 DIAGNOSIS — Z3402 Encounter for supervision of normal first pregnancy, second trimester: Secondary | ICD-10-CM

## 2020-11-11 DIAGNOSIS — O10919 Unspecified pre-existing hypertension complicating pregnancy, unspecified trimester: Secondary | ICD-10-CM

## 2020-11-11 DIAGNOSIS — O99210 Obesity complicating pregnancy, unspecified trimester: Secondary | ICD-10-CM

## 2020-11-11 NOTE — Progress Notes (Signed)
ROB [redacted]w[redacted]d  2hr GTT.  T-Dap Declined  PQH9=0  CC: None

## 2020-11-11 NOTE — Progress Notes (Signed)
   PRENATAL VISIT NOTE  Subjective:  Theresa Patterson is a 25 y.o. G2P0010 at [redacted]w[redacted]d being seen today for ongoing prenatal care.  She is currently monitored for the following issues for this high-risk pregnancy and has Encounter for supervision of normal first pregnancy in second trimester; Obesity in pregnancy; Chronic hypertension during pregnancy; Morbid obesity (HCC); Chronic headache; BMI 45.0-49.9, adult (HCC); and Alpha thalassemia silent carrier on their problem list.  Patient reports no complaints.  Contractions: Not present. Vag. Bleeding: None.  Movement: Present. Denies leaking of fluid.   The following portions of the patient's history were reviewed and updated as appropriate: allergies, current medications, past family history, past medical history, past social history, past surgical history and problem list.   Objective:   Vitals:   11/11/20 0917  BP: 134/81  Pulse: 98  Weight: (!) 320 lb (145.2 kg)    Fetal Status: Fetal Heart Rate (bpm): 146 Fundal Height: 26 cm Movement: Present     General:  Alert, oriented and cooperative. Patient is in no acute distress.  Skin: Skin is warm and dry. No rash noted.   Cardiovascular: Normal heart rate noted  Respiratory: Normal respiratory effort, no problems with respiration noted  Abdomen: Soft, gravid, appropriate for gestational age.  Pain/Pressure: Present     Pelvic: Cervical exam deferred        Extremities: Normal range of motion.  Edema: None  Mental Status: Normal mood and affect. Normal behavior. Normal judgment and thought content.   Assessment and Plan:  Pregnancy: G2P0010 at [redacted]w[redacted]d 1. Encounter for supervision of normal first pregnancy in second trimester Patient is doing well Glucola and third trimester labs today Peds list provided  2. Obesity in pregnancy Continue ASA  3. Chronic hypertension during pregnancy Continue procardia and ASA Follow up growth ultrasound as scheduled per MFM Weekly antenatal testing  starting at 32 weeks  Preterm labor symptoms and general obstetric precautions including but not limited to vaginal bleeding, contractions, leaking of fluid and fetal movement were reviewed in detail with the patient. Please refer to After Visit Summary for other counseling recommendations.   Return in about 2 weeks (around 11/25/2020) for in person, ROB, High risk.  Future Appointments  Date Time Provider Department Center  11/17/2020  1:30 PM [facility] NURSE [facility]  11/17/2020  1:45 PM [facility] US[facility]    Catalina Antigua, MD

## 2020-11-11 NOTE — Patient Instructions (Signed)
AREA PEDIATRIC/FAMILY PRACTICE PHYSICIANS  Central/[place]outheast [place] ([address]) [facility] Chambliss, MD; Eniola, MD; Hale, MD; Hensel, MD; McDiarmid, MD; McIntyer, MD; Neal, MD; Walden, MD [address] [place], [place] [address] [phone] Mon-Fri 8:30-12:30, 1:30-5:00 Providers come to see babies at [facility] Accepting Medicaid [facility] providers who accept newborns: Koirala, MD; Morrow, MD; Wolters, MD [address], [place], [place] [address] [phone] Mon-Fri 8:00-5:30 Babies seen by providers at [facility] Does NOT accept Medicaid Please call early in hospitalization for appointment (limited availability)  [facility] Mulberry, MD [address] [place], [place] [address] [phone] Mon, Tue, Thur, Fri 8:30-5:00, Wed 10:00-7:00 (closed 1-2pm) Babies seen by [facility] providers Accepting Medicaid Rubin - Pediatrician Rubin, MD [address], [place], [place] [address] [phone] Mon-Fri 8:30-5:00, Sat 8:30-12:00 Provider comes to see babies at [facility] Accepting Medicaid Must have been referred from current patients or contacted office prior to delivery [facility] ([facility]) Brown, MD; Chandler, MD; Ettefagh, MD; Grant, MD; Lester, MD; McCormick, MD; McQueen, MD; Prose, MD; Simha, MD; Stanley, MD; Stryffeler, NP; Tebben, NP [address], [place], [place] [address] [phone] Mon, Tue, Thur, Fri 8:30-5:30, Wed 9:30-5:30, Sat 8:30-12:30 Babies seen by [facility] providers Accepting Medicaid Only accepting infants of first-time parents or siblings of current patients Hospital discharge coordinator will make follow-up appointment Jack Amos [address], [place], [place]  [address] [phone]   Fax - [phone] [facility] [address], [place], [place]  [address] Phone - [phone]   Fax - [phone] Shilpa Gosrani [address], [place], [place]  [address] [phone]  East/[place]ortheast [place] ([address]) [facility] Bates, MD; Brassfield, MD; Cooper, Cox, MD; MD; Davis, MD; Dovico, MD; Ettefaugh, MD; Little, MD; Lowe, MD; Keiffer, MD; Melvin, MD; Sumner, MD; Williams, MD [address], [place], [place] [address] [phone] Mon-Fri 8:30-5:00 (extended evenings Mon-Thur as needed), Sat-Sun 10:00-1:00 Providers come to see babies at [facility] Accepting Medicaid for families of first-time babies and families with all children in the household age 3 and under. Must register with office prior to making appointment (M-F only). [facility] Henson, NP; Knapp, MD; Lalonde, MD; Tysinger, PA [address] [place], [place] [address] [phone] Mon-Fri 8:00-5:00 Babies seen by providers at [facility] Does NOT accept Medicaid/Commercial Insurance Only [facility] ([facility])  Artis, MD; Barnes, MD; Bratton, MD; Coccaro, MD; [place] Gardner, MD; Kramer, MD; Marshall, MD; Netherton, MD; Poleto, MD; Skinner, MD [address] [place], [place] [address] [phone] Mon-Fri 8:30-5:30, Sat (Oct.-Mar.) 9:00-1:00 Babies seen by providers at [facility] Accepting Medicaid  [place] ([address]) [facility] Reid, MD; Warner, MD [address], [place], [place] [address] [phone] Mon-Fri 8:30-5:00, Sat 8:30-12:00 Providers come to see babies at [facility] Does NOT accept Medicaid [facility] Becker, PA; Hagler, MD; Scifres, PA; Sun, MD; Swayne, MD [address], [place],  [address] [phone] Mon-Fri 8:00-5:00 Babies seen by providers at [facility] Does NOT accept Medicaid Only accepting babies of parents who  are patients Please call early in hospitalization for appointment (limited availability) [facility] Clark, MD; Frye, MD; Kelleher, MD; Mack, NP; Miller, MD; O'Keller, MD; Patterson, NP; Pudlo, MD; Puzio, MD; Thomas, MD; Tucker, MD; Twiselton, MD [address], [place], [place] [address] [phone] Mon-Fri 8:00-5:00, Sat 9:00-12:00 Providers come to see babies at [facility] Does NOT accept Medicaid  [place] ([address]) [facility] providers accepting new patients: Brake, NP; Wharton, PA [address], [place], [place] [address] [phone] Mon-Fri 8:00-5:00 Babies seen by providers at [facility] Does NOT accept Medicaid Only accepting babies of parents who are patients Please call early in hospitalization for appointment (limited availability) [facility] Gay, MD; Quinlan, MD [address] [place], [place] [address] [phone] (press 1 to schedule appointment) Mon-Fri 8:00-5:00 Providers come to see babies at [facility] Does NOT accept Medicaid [facility] Mazer, MD [address] [place], [place] [address] [phone] Mon-Fri 8:30-5:00 (lunch 12:30-1:00), extended hours by appointment only Wed 5:00-6:30 Babies seen by [facility] providers Accepting Medicaid [facility] Banks, MD; Jordan, MD; Koberlein, MD [address], [place], [place] [address] [phone] Mon-Fri 8:00-5:00 Babies seen by [facility] providers Does NOT accept Medicaid [facility] Parker, MD; Hunter, MD; Wallace, DO [address] [place], [place] [address] [phone] Mon-Fri 8:00-5:00 Babies seen by [facility] providers Does NOT accept Medicaid [facility] Brandon, PA; Brecken, PA; Christy, NP; Dees, MD; DeClaire, MD; DeWeese, MD; Hansen, NP; Mills, NP; Parrish, NP; Smoot, NP; Summer, MD; Vapne,  MD [address] [place], [place] [address] [phone] Mon-Fri 8:30-5:00, Sat 10:00-1:00 Providers come to see babies at [facility] Does NOT accept Medicaid Free prenatal information session Tuesdays at 4:45pm [facility] Bouska, MD; Gordon, PA; Jeffery, PA; Weber, PA [address] [place] [address] [phone] Mon-Fri 7:30-5:30 Babies seen by [facility] providers [facility] [address], [place], [place]  [address] [phone]   Fax - [phone]  [place] ([address] & [address]) [facility] [place] [address] [place], [place] [address] [phone] Mon-Thur 8:00-6:00 Providers come to see babies at [facility] Accepting Medicaid [facility] Anderson, NP; Badger, MD; Beal, PA; Spencer, PA [address] [place], [place] [address] [phone] Mon-Thur 7:30-7:30, Fri 7:30-4:30 Babies seen by [facility] providers Accepting Medicaid [facility] Agbuya, MD; Klett, NP; Romgoolam, MD [address], [place], [place] [address] [phone] Mon-Fri 8:30-5:00, Sat 8:30-12:00 Providers come to see babies at [facility] Accepting Medicaid Must have "Meet & Greet" appointment at office prior to delivery [facility] ([facility]) McCord, MD; Wallace, MD; Wood, MD [address], [place], [place] [address] [phone] Mon-Wed 8:00-6:00, Thur-Fri 8:00-5:00, Sat 9:00-12:00 Providers come to see babies at [facility] Does NOT accept Medicaid Only accepting siblings of current patients [facility]  [address], [place], [place]  [address] [phone]   Fax - [phone] [facility] [address], [place], [place]  [address] [phone]   Fax -  [phone]  [place] ([address] & [address]) [facility] Cirigliano, DO; Matthews, DO [address] , [place] [address] [phone] Mon-Fri 7:00-5:00 Babies seen by [facility] providers Does NOT accept Medicaid [facility] Briscoe, MD; Howley, PA; Moreira, PA [address], [place], [place] [address] [phone] Mon-Fri 8:00-5:00 Babies seen by [facility] providers Accepting Medicaid [facility] Boyd, MD; Church, PA; Jones, NP; Osborn, PA [address], , [place] [address] [phone] Mon-Fri 8:00-5:00 Babies seen by providers at [facility] Accepting Medicaid  [place] ([address]) [facility] [place] Wendling, DO [address] [place], [place] [address] [phone] Mon-Fri 8:00-5:00 Babies seen by [facility] providers Does NOT accept Medicaid Limited availability, please call early in hospitalization to schedule follow-up [facility] Calderon, PA; Cummings, MD; Dillard, MD; Martin, PA; Olson, MD; VanDeven, PA [address], [place], [place] [address] [phone] Mon-Fri 8:30-5:00, Sat 9:00-12:00 Babies seen by providers at [facility] Accepting Medicaid Please register online then schedule online or call office [email or web address] Hunter, NP; Kumar, MD; Martin Rogers, PA [address], [place], [place] [address] [phone] Mon-Fri 8:00-5:00 Babies seen by providers at [facility] Accepting Medicaid [facility] ([facility]) [place], MD; Kristi Fleenor, NP; West, MD [address], [place], [place] [address] [phone] Mon-Fri 8:00-5:30, Sat&Sun by appointment (phones open at  8:30) Babies seen by [facility] providers Accepting Medicaid Must be a first-time baby or sibling of current patient [facility] [place]  [address], [place], [place]  [address] [phone]   Fax - [phone]  [place] ([address]) [facility] Brown, PA; Cowen, PA; Rice, MD; Helton, PA; Spry, MD [address] [place], [place] [address] [phone] Mon-Thur 8:00-7:00, Fri 8:00-5:00, Sat 8:00-12:00, Sun 9:00-12:00 Babies seen by [facility] providers Accepting Medicaid [facility] Coe-Goins, MD; Marshall, MD; Pierre-Louis, MD [address], [place], [place] [address] [phone] Mon-Thur 8:00-5:00 Babies seen by providers at [facility] Accepting Medicaid [facility] Bratton, MD; Coe-Goins, MD; Hayes, MD; Lewis, MD; List, MD; Lott, MD; Marshall, MD; Moran, MD; O'Neal, MD; Pierre-Louis, MD; Pitonzo, MD; Scholer, MD; Spangle, MD [address] [place], [place] [address] [phone] Mon-Fri 8:00-5:30, Sat (Oct.-Mar.) 9:00-1:00 Babies seen by providers at [facility] Accepting Medicaid Must fill out new patient packet, available online at [email or web address] [facility] ([facility]) Friddle, NP; Bosque, NP; Kelly, NP; [place], MD; Melvin, PA; Poth, MD; Ramadoss, MD; Stanton, NP [address], [place], [place] [address] [phone] Mon-Thur 8:00-5:30, Fri 8:00-5:00 Babies seen by providers at [facility] Accepting Medicaid  [place] ([address]) [facility] Dixon, PA; Bronson, MD; Pickard, MD; Tapia, PA [address], [place], [place] [address] [phone] Mon-Fri 8:00-5:00 Babies seen by providers at [facility] Accepting Medicaid   [place] ([address]) [facility] [place] Masneri, DO; Meyers, MD; Nelson, PA [address], [place], [place] [address] [phone] Mon-Fri 8:00-5:00 Babies seen by providers at [facility] Does NOT accept Medicaid Limited appointment availability, please call early in hospitalization  [facility] [place] Kunedd, DO; McGowen, MD [address], [place], [place] [address] [phone] Mon-Fri 8:00-5:00 Babies seen by [facility] providers Does NOT accept Medicaid [facility] [place] Cameron, MD; MacDonald, MD; Michaels, PA; Nayak, MD [address] [place] [address], [place], [place] [address] [phone] Mon-Fri 8:00-5:00 After hours clinic ([address] ,  [address]) [phone] Mon-Fri 5:00-8:00, Sat 12:00-6:00, Sun 10:00-4:00 Babies seen by [facility] providers Accepting Medicaid [facility] [place] [address], [place], [place]  [address] [phone]   Fax - [phone]  [place] ([address]) [facility] [place] [facility] Andy, MD [address], [place], [place] [address] [phone] Mon-Fri 8:00-5:00 Babies seen by [facility] providers Does NOT accept Medicaid [facility] [place] ([facility] [place]) Eksir, MD [address], [place], [place] [address] [phone] Mon-Thur 8:00-7:00, Fri 8:00-5:00, Sat 8:00-12:00 Babies seen by providers at [facility] Accepting Medicaid - but does not have vaccinations in office (must be received elsewhere) Limited availability, please call early in hospitalization  [place] ([address]) [facility]  Charlene Flemming, MD [address], [place] [address] [phone]  Fax [phone]  [facility]  Kimberly Newton, MD, Annamarie Streilein, PA, Carla Hampton, PA [address] [place], [place]  [address] [phone] [facility]  [address], [place], [place] [address] [phone] [address], [place], [place] [address] [phone] (West Office)  [facility] [address], [place], [place] [address] [phone] [facility] [address], [place], [place] [address] [phone] [facility] [address], [place], [place] [address] [phone] [facility] [address], [place], [place] [address] [phone] [facility] [address], [place], [place] [address] [phone] [facility] [address], [place], [place] [address] [phone] [facility]  [address], [place], [place] [address] [phone] Dr[facility] [address] [place] [address], [place], [place] [address] [phone] [facility] [address], [place], [place] [address] [phone] [facility] [address], [place],  [address] [phone]  

## 2020-11-12 LAB — CBC
Hematocrit: 36.7 % (ref 34.0–46.6)
Hemoglobin: 12 g/dL (ref 11.1–15.9)
MCH: 26.3 pg — ABNORMAL LOW (ref 26.6–33.0)
MCHC: 32.7 g/dL (ref 31.5–35.7)
MCV: 81 fL (ref 79–97)
Platelets: 320 10*3/uL (ref 150–450)
RBC: 4.56 x10E6/uL (ref 3.77–5.28)
RDW: 13.8 % (ref 11.7–15.4)
WBC: 8 10*3/uL (ref 3.4–10.8)

## 2020-11-12 LAB — RPR: RPR Ser Ql: NONREACTIVE

## 2020-11-12 LAB — GLUCOSE TOLERANCE, 2 HOURS W/ 1HR
Glucose, 1 hour: 98 mg/dL (ref 65–179)
Glucose, 2 hour: 89 mg/dL (ref 65–152)
Glucose, Fasting: 68 mg/dL (ref 65–91)

## 2020-11-12 LAB — HIV ANTIBODY (ROUTINE TESTING W REFLEX): HIV Screen 4th Generation wRfx: NONREACTIVE

## 2020-11-17 ENCOUNTER — Other Ambulatory Visit: Payer: Self-pay

## 2020-11-17 ENCOUNTER — Other Ambulatory Visit: Payer: Self-pay | Admitting: *Deleted

## 2020-11-17 ENCOUNTER — Ambulatory Visit: Payer: BC Managed Care – PPO | Admitting: *Deleted

## 2020-11-17 ENCOUNTER — Encounter: Payer: Self-pay | Admitting: *Deleted

## 2020-11-17 ENCOUNTER — Ambulatory Visit: Payer: BC Managed Care – PPO | Attending: Obstetrics and Gynecology

## 2020-11-17 VITALS — BP 121/71 | HR 104

## 2020-11-17 DIAGNOSIS — O10912 Unspecified pre-existing hypertension complicating pregnancy, second trimester: Secondary | ICD-10-CM | POA: Diagnosis not present

## 2020-11-17 DIAGNOSIS — Z3A27 27 weeks gestation of pregnancy: Secondary | ICD-10-CM

## 2020-11-17 DIAGNOSIS — O10012 Pre-existing essential hypertension complicating pregnancy, second trimester: Secondary | ICD-10-CM | POA: Diagnosis not present

## 2020-11-17 DIAGNOSIS — O99212 Obesity complicating pregnancy, second trimester: Secondary | ICD-10-CM | POA: Diagnosis not present

## 2020-11-17 DIAGNOSIS — Z3402 Encounter for supervision of normal first pregnancy, second trimester: Secondary | ICD-10-CM | POA: Diagnosis present

## 2020-11-17 DIAGNOSIS — D563 Thalassemia minor: Secondary | ICD-10-CM | POA: Insufficient documentation

## 2020-11-24 ENCOUNTER — Observation Stay (HOSPITAL_COMMUNITY)
Admission: AD | Admit: 2020-11-24 | Discharge: 2020-11-26 | Disposition: A | Discharge status: Home / Self Care | Payer: BC Managed Care – PPO | Attending: Obstetrics and Gynecology | Admitting: Obstetrics and Gynecology

## 2020-11-24 ENCOUNTER — Encounter (HOSPITAL_COMMUNITY): Payer: Self-pay | Admitting: Obstetrics & Gynecology

## 2020-11-24 ENCOUNTER — Other Ambulatory Visit: Payer: Self-pay

## 2020-11-24 DIAGNOSIS — O113 Pre-existing hypertension with pre-eclampsia, third trimester: Secondary | ICD-10-CM | POA: Diagnosis not present

## 2020-11-24 DIAGNOSIS — Z20822 Contact with and (suspected) exposure to covid-19: Secondary | ICD-10-CM | POA: Insufficient documentation

## 2020-11-24 DIAGNOSIS — O1492 Unspecified pre-eclampsia, second trimester: Secondary | ICD-10-CM | POA: Diagnosis present

## 2020-11-24 DIAGNOSIS — O10013 Pre-existing essential hypertension complicating pregnancy, third trimester: Secondary | ICD-10-CM | POA: Diagnosis present

## 2020-11-24 DIAGNOSIS — O1412 Severe pre-eclampsia, second trimester: Secondary | ICD-10-CM | POA: Diagnosis not present

## 2020-11-24 DIAGNOSIS — Z3A28 28 weeks gestation of pregnancy: Secondary | ICD-10-CM | POA: Diagnosis not present

## 2020-11-24 DIAGNOSIS — Z79899 Other long term (current) drug therapy: Secondary | ICD-10-CM | POA: Diagnosis not present

## 2020-11-24 DIAGNOSIS — Z7982 Long term (current) use of aspirin: Secondary | ICD-10-CM | POA: Insufficient documentation

## 2020-11-24 DIAGNOSIS — R519 Headache, unspecified: Secondary | ICD-10-CM | POA: Diagnosis present

## 2020-11-24 DIAGNOSIS — O119 Pre-existing hypertension with pre-eclampsia, unspecified trimester: Secondary | ICD-10-CM | POA: Diagnosis present

## 2020-11-24 DIAGNOSIS — O26892 Other specified pregnancy related conditions, second trimester: Secondary | ICD-10-CM | POA: Diagnosis not present

## 2020-11-24 DIAGNOSIS — O26893 Other specified pregnancy related conditions, third trimester: Secondary | ICD-10-CM | POA: Diagnosis present

## 2020-11-24 DIAGNOSIS — R002 Palpitations: Secondary | ICD-10-CM | POA: Diagnosis not present

## 2020-11-24 LAB — CBC
HCT: 34.9 % — ABNORMAL LOW (ref 36.0–46.0)
Hemoglobin: 11.2 g/dL — ABNORMAL LOW (ref 12.0–15.0)
MCH: 26.3 pg (ref 26.0–34.0)
MCHC: 32.1 g/dL (ref 30.0–36.0)
MCV: 81.9 fL (ref 80.0–100.0)
Platelets: 312 10*3/uL (ref 150–400)
RBC: 4.26 MIL/uL (ref 3.87–5.11)
RDW: 14.9 % (ref 11.5–15.5)
WBC: 9.2 10*3/uL (ref 4.0–10.5)
nRBC: 0 % (ref 0.0–0.2)

## 2020-11-24 LAB — TYPE AND SCREEN
ABO/RH(D): O POS
Antibody Screen: NEGATIVE

## 2020-11-24 LAB — COMPREHENSIVE METABOLIC PANEL
ALT: 20 U/L (ref 0–44)
AST: 16 U/L (ref 15–41)
Albumin: 2.8 g/dL — ABNORMAL LOW (ref 3.5–5.0)
Alkaline Phosphatase: 92 U/L (ref 38–126)
Anion gap: 8 (ref 5–15)
BUN: 5 mg/dL — ABNORMAL LOW (ref 6–20)
CO2: 21 mmol/L — ABNORMAL LOW (ref 22–32)
Calcium: 9.4 mg/dL (ref 8.9–10.3)
Chloride: 105 mmol/L (ref 98–111)
Creatinine, Ser: 0.6 mg/dL (ref 0.44–1.00)
GFR, Estimated: >60 mL/min (ref >60)
Glucose, Bld: 82 mg/dL (ref 70–99)
Potassium: 3.9 mmol/L (ref 3.5–5.1)
Sodium: 134 mmol/L — ABNORMAL LOW (ref 135–145)
Total Bilirubin: 0.5 mg/dL (ref 0.3–1.2)
Total Protein: 6.4 g/dL — ABNORMAL LOW (ref 6.5–8.1)

## 2020-11-24 LAB — URINALYSIS, ROUTINE W REFLEX MICROSCOPIC
Bacteria, UA: NONE SEEN
Bilirubin Urine: NEGATIVE
Glucose, UA: NEGATIVE mg/dL
Hgb urine dipstick: NEGATIVE
Ketones, ur: NEGATIVE mg/dL
Nitrite: NEGATIVE
Protein, ur: NEGATIVE mg/dL
Specific Gravity, Urine: 1.006 (ref 1.005–1.030)
pH: 7 (ref 5.0–8.0)

## 2020-11-24 LAB — RESP PANEL BY RT-PCR (FLU A&B, COVID) ARPGX2
Influenza A by PCR: NEGATIVE
Influenza B by PCR: NEGATIVE
SARS Coronavirus 2 by RT PCR: NEGATIVE

## 2020-11-24 LAB — PROTEIN / CREATININE RATIO, URINE
Creatinine, Urine: 56.25 mg/dL
Protein Creatinine Ratio: 3.41 mg/mg{Cre} — ABNORMAL HIGH (ref 0.00–0.15)
Total Protein, Urine: 192 mg/dL

## 2020-11-24 MED ORDER — DOXYLAMINE SUCCINATE (SLEEP) 25 MG PO TABS: 25.0000 mg | ORAL_TABLET | Freq: Every evening | ORAL | Status: DC | PRN

## 2020-11-24 MED ORDER — HYDRALAZINE HCL 20 MG/ML IJ SOLN: 10.0000 mg | INTRAMUSCULAR | Status: DC | PRN

## 2020-11-24 MED ORDER — CYCLOBENZAPRINE HCL 10 MG PO TABS
10.0000 mg | ORAL_TABLET | Freq: Two times a day (BID) | ORAL | Status: DC | PRN
  Administered 2020-11-25: 10 mg via ORAL
  Filled 2020-11-24: qty 1

## 2020-11-24 MED ORDER — ACETAMINOPHEN 325 MG PO TABS: 650.0000 mg | ORAL_TABLET | ORAL | Status: DC | PRN

## 2020-11-24 MED ORDER — NIFEDIPINE ER OSMOTIC RELEASE 30 MG PO TB24
30.0000 mg | ORAL_TABLET | Freq: Every day | ORAL | Status: DC
  Administered 2020-11-25 – 2020-11-26 (×2): 30 mg via ORAL
  Filled 2020-11-24 (×2): qty 1

## 2020-11-24 MED ORDER — CALCIUM CARBONATE ANTACID 500 MG PO CHEW: 2.0000 | CHEWABLE_TABLET | ORAL | Status: DC | PRN

## 2020-11-24 MED ORDER — BETAMETHASONE SOD PHOS & ACET 6 (3-3) MG/ML IJ SUSP
12.0000 mg | INTRAMUSCULAR | Status: AC
Start: 2020-11-24 — End: 2020-11-25
  Administered 2020-11-24 – 2020-11-25 (×2): 12 mg via INTRAMUSCULAR
  Filled 2020-11-24: qty 5

## 2020-11-24 MED ORDER — VITAMIN B-6 25 MG PO TABS
25.0000 mg | ORAL_TABLET | Freq: Every day | ORAL | Status: DC
  Filled 2020-11-24: qty 1

## 2020-11-24 MED ORDER — ZOLPIDEM TARTRATE 5 MG PO TABS: 5.0000 mg | ORAL_TABLET | Freq: Every evening | ORAL | Status: DC | PRN

## 2020-11-24 MED ORDER — PANTOPRAZOLE SODIUM 40 MG PO TBEC
40.0000 mg | DELAYED_RELEASE_TABLET | Freq: Every day | ORAL | Status: DC
  Administered 2020-11-25 – 2020-11-26 (×2): 40 mg via ORAL
  Filled 2020-11-24 (×2): qty 1

## 2020-11-24 MED ORDER — PRENATAL MULTIVITAMIN CH
1.0000 | ORAL_TABLET | Freq: Every day | ORAL | Status: DC
  Administered 2020-11-25 – 2020-11-26 (×2): 1 via ORAL
  Filled 2020-11-24 (×2): qty 1

## 2020-11-24 MED ORDER — ASPIRIN EC 81 MG PO TBEC
81.0000 mg | DELAYED_RELEASE_TABLET | Freq: Every day | ORAL | Status: DC
  Administered 2020-11-25 – 2020-11-26 (×2): 81 mg via ORAL
  Filled 2020-11-24 (×2): qty 1

## 2020-11-24 MED ORDER — MAGNESIUM SULFATE 40 GM/1000ML IV SOLN
2.0000 g/h | INTRAVENOUS | Status: AC
  Administered 2020-11-24: 2 g/h via INTRAVENOUS
  Filled 2020-11-24 (×2): qty 1000

## 2020-11-24 MED ORDER — MAGNESIUM SULFATE BOLUS VIA INFUSION
4.0000 g | Freq: Once | INTRAVENOUS | Status: AC
  Administered 2020-11-24: 4 g via INTRAVENOUS
  Filled 2020-11-24: qty 1000

## 2020-11-24 MED ORDER — ACETAMINOPHEN 500 MG PO TABS
1000.0000 mg | ORAL_TABLET | Freq: Four times a day (QID) | ORAL | Status: DC | PRN
  Administered 2020-11-25 (×2): 1000 mg via ORAL
  Filled 2020-11-24 (×2): qty 2

## 2020-11-24 MED ORDER — METOCLOPRAMIDE HCL 10 MG PO TABS
5.0000 mg | ORAL_TABLET | Freq: Three times a day (TID) | ORAL | Status: DC
  Administered 2020-11-25 – 2020-11-26 (×4): 5 mg via ORAL
  Filled 2020-11-24 (×4): qty 1

## 2020-11-24 MED ORDER — TRAMADOL HCL 50 MG PO TABS
100.0000 mg | ORAL_TABLET | Freq: Once | ORAL | Status: AC
  Administered 2020-11-24: 100 mg via ORAL
  Filled 2020-11-24: qty 2

## 2020-11-24 MED ORDER — DOCUSATE SODIUM 100 MG PO CAPS
100.0000 mg | ORAL_CAPSULE | Freq: Every day | ORAL | Status: DC
  Administered 2020-11-25 – 2020-11-26 (×2): 100 mg via ORAL
  Filled 2020-11-24 (×2): qty 1

## 2020-11-24 MED ORDER — LABETALOL HCL 5 MG/ML IV SOLN: 80.0000 mg | INTRAVENOUS | Status: DC | PRN

## 2020-11-24 MED ORDER — LABETALOL HCL 5 MG/ML IV SOLN: 40.0000 mg | INTRAVENOUS | Status: DC | PRN

## 2020-11-24 MED ORDER — BUTALBITAL-APAP-CAFFEINE 50-325-40 MG PO TABS: 1.0000 | ORAL_TABLET | Freq: Four times a day (QID) | ORAL | Status: DC | PRN

## 2020-11-24 MED ORDER — LACTATED RINGERS IV SOLN: INTRAVENOUS | Status: DC

## 2020-11-24 MED ORDER — LABETALOL HCL 5 MG/ML IV SOLN: 20.0000 mg | INTRAVENOUS | Status: DC | PRN

## 2020-11-24 MED ORDER — PREPLUS 27-1 MG PO TABS
1.0000 | ORAL_TABLET | Freq: Every day | ORAL | Status: DC
Start: 2020-11-25 — End: 2020-11-25

## 2020-11-24 MED ORDER — OXYCODONE HCL 5 MG PO TABS: 5.0000 mg | ORAL_TABLET | ORAL | Status: DC | PRN

## 2020-11-24 NOTE — H&P (Addendum)
Chief Complaint:  Headache and Decreased Fetal Movement   Event Date/Time   First Provider Initiated Contact with Patient 11/24/20 1759      HPI: Theresa Patterson is a 25 y.o. G2P0010 at [redacted]w[redacted]d by LMP who presents to maternity admissions reporting palpitations, feeling like her heart is racing, and h/a since yesterday, 11/23/20.    She reports good fetal movement, denies LOF, vaginal bleeding, vaginal itching/burning, urinary symptoms, h/a, dizziness, n/v, or fever/chills.     Location: headache Quality: constant Severity: 8/10 on pain scale Duration: 1 day Timing: constant Modifying factors: Not improved with Fioricet on 11/23/20 or Tylenol 1000 mg on 11/24/20 Associated signs and symptoms: palpitations, heart racing  HPI  Past Medical History: Past Medical History:  Diagnosis Date   Hypertension    Polycystic ovary syndrome     Past obstetric history: OB History  Gravida Para Term Preterm AB Living  2       1    SAB IAB Ectopic Multiple Live Births  1            # Outcome Date GA Lbr Len/2nd Weight Sex Delivery Anes PTL Lv  2 Current           1 SAB             Past Surgical History: Past Surgical History:  Procedure Laterality Date   NO PAST SURGERIES      Family History: Family History  Problem Relation Age of Onset   Obesity Mother    Obesity Maternal Grandmother     Social History: Social History   Tobacco Use   Smoking status: Never   Smokeless tobacco: Never  Vaping Use   Vaping Use: Never used  Substance Use Topics   Alcohol use: Not Currently    Comment: not since confirmed pregnancy   Drug use: Not Currently    Allergies: No Known Allergies  Meds:  Medications Prior to Admission  Medication Sig Dispense Refill Last Dose   aspirin EC 81 MG tablet Take 1 tablet (81 mg total) by mouth daily. 60 tablet 2 11/24/2020   Butalbital-APAP-Caffeine 50-325-40 MG capsule Take 1-2 capsules by mouth every 6 (six) hours as needed for headache. 30 capsule 1  11/23/2020   cyclobenzaprine (FLEXERIL) 10 MG tablet Take 1 tablet (10 mg total) by mouth 2 (two) times daily as needed for muscle spasms. 20 tablet 0 11/23/2020   doxylamine, Sleep, (UNISOM) 25 MG tablet Take 25 mg by mouth at bedtime as needed.   11/23/2020   metoCLOPramide (REGLAN) 5 MG tablet Take 1 tablet (5 mg total) by mouth 3 (three) times daily before meals. 90 tablet 1 11/24/2020   NIFEdipine (PROCARDIA-XL/NIFEDICAL-XL) 30 MG 24 hr tablet Take 1 tablet (30 mg total) by mouth daily. 60 tablet 2 11/24/2020   omeprazole (PRILOSEC) 40 MG capsule Take 1 capsule (40 mg total) by mouth 2 (two) times daily. 60 capsule 2 11/24/2020   Prenatal Vit-Fe Fumarate-FA (PREPLUS) 27-1 MG TABS Take 1 tablet by mouth daily. 30 tablet 13 11/24/2020   vitamin B-6 (PYRIDOXINE) 25 MG tablet Take 25 mg by mouth daily.   11/23/2020    ROS:  Review of Systems  Constitutional:  Negative for chills, fatigue and fever.  Eyes:  Negative for visual disturbance.  Respiratory:  Negative for shortness of breath.   Cardiovascular:  Positive for palpitations. Negative for chest pain.  Gastrointestinal:  Negative for abdominal pain, nausea and vomiting.  Genitourinary:  Negative for difficulty urinating, dysuria, flank  pain, pelvic pain, vaginal bleeding, vaginal discharge and vaginal pain.  Neurological:  Positive for headaches. Negative for dizziness.  Psychiatric/Behavioral: Negative.      I have reviewed patient's Past Medical Hx, Surgical Hx, Family Hx, Social Hx, medications and allergies.   Physical Exam  Patient Vitals for the past 24 hrs:  BP Temp Temp src Pulse Resp SpO2 Height Weight  11/24/20 2031 137/82 -- -- (!) 104 -- -- -- --  11/24/20 2017 (!) 147/87 -- -- (!) 101 -- -- -- --  11/24/20 1900 124/71 -- -- (!) 107 -- -- -- --  11/24/20 1740 121/79 -- -- (!) 109 -- -- -- --  11/24/20 1716 (!) 143/89 98.2 F (36.8 C) Oral (!) 110 18 100 % -- --  11/24/20 1710 -- -- -- -- -- -- 5\' 8"  (1.727 m) (!) 148 kg    Constitutional: Well-developed, well-nourished female in mild distress.  HEART: normal rate, heart sounds, regular rhythm RESP: normal effort, lung sounds clear and equal bilaterally  GI: Abd soft, non-tender, gravid appropriate for gestational age.  MS: Extremities nontender, no edema, normal ROM Neurologic: Alert and oriented x 4.  GU: Neg CVAT.  PELVIC EXAM: Deferred     FHT:  Baseline 140 , moderate variability, accelerations present, no decelerations Contractions: none on toco or to palpation    EKG: sinus tachycardia.  Labs: Results for orders placed or performed during the hospital encounter of 11/24/20 (from the past 24 hour(s))  Urinalysis, Routine w reflex microscopic Urine, Clean Catch     Status: Abnormal   Collection Time: 11/24/20  5:47 PM  Result Value Ref Range   Color, Urine YELLOW YELLOW   APPearance CLEAR CLEAR   Specific Gravity, Urine 1.006 1.005 - 1.030   pH 7.0 5.0 - 8.0   Glucose, UA NEGATIVE NEGATIVE mg/dL   Hgb urine dipstick NEGATIVE NEGATIVE   Bilirubin Urine NEGATIVE NEGATIVE   Ketones, ur NEGATIVE NEGATIVE mg/dL   Protein, ur NEGATIVE NEGATIVE mg/dL   Nitrite NEGATIVE NEGATIVE   Leukocytes,Ua SMALL (A) NEGATIVE   RBC / HPF 0-5 0 - 5 RBC/hpf   WBC, UA 0-5 0 - 5 WBC/hpf   Bacteria, UA NONE SEEN NONE SEEN   Squamous Epithelial / LPF 0-5 0 - 5  Protein / creatinine ratio, urine     Status: Abnormal   Collection Time: 11/24/20  5:59 PM  Result Value Ref Range   Creatinine, Urine 56.25 mg/dL   Total Protein, Urine 192 mg/dL   Protein Creatinine Ratio 3.41 (H) 0.00 - 0.15 mg/mg[Cre]  CBC     Status: Abnormal   Collection Time: 11/24/20  6:40 PM  Result Value Ref Range   WBC 9.2 4.0 - 10.5 K/uL   RBC 4.26 3.87 - 5.11 MIL/uL   Hemoglobin 11.2 (L) 12.0 - 15.0 g/dL   HCT 11/26/20 (L) 62.1 - 30.8 %   MCV 81.9 80.0 - 100.0 fL   MCH 26.3 26.0 - 34.0 pg   MCHC 32.1 30.0 - 36.0 g/dL   RDW 65.7 84.6 - 96.2 %   Platelets 312 150 - 400 K/uL   nRBC 0.0  0.0 - 0.2 %  Comprehensive metabolic panel     Status: Abnormal   Collection Time: 11/24/20  6:40 PM  Result Value Ref Range   Sodium 134 (L) 135 - 145 mmol/L   Potassium 3.9 3.5 - 5.1 mmol/L   Chloride 105 98 - 111 mmol/L   CO2 21 (L) 22 - 32 mmol/L  Glucose, Bld 82 70 - 99 mg/dL   BUN 5 (L) 6 - 20 mg/dL   Creatinine, Ser 9.38 0.44 - 1.00 mg/dL   Calcium 9.4 8.9 - 18.2 mg/dL   Total Protein 6.4 (L) 6.5 - 8.1 g/dL   Albumin 2.8 (L) 3.5 - 5.0 g/dL   AST 16 15 - 41 U/L   ALT 20 0 - 44 U/L   Alkaline Phosphatase 92 38 - 126 U/L   Total Bilirubin 0.5 0.3 - 1.2 mg/dL   GFR, Estimated >99 >37 mL/min   Anion gap 8 5 - 15   O/Positive/-- (06/09 1049)  Imaging:   MAU Course/MDM: Orders Placed This Encounter  Procedures   Urinalysis, Routine w reflex microscopic Urine, Clean Catch   CBC   Comprehensive metabolic panel   Protein / creatinine ratio, urine   Diet regular Room service appropriate? Yes; Fluid consistency: Thin   Vital signs   Notify physician (specify) Confirmatory reading of BP> 160/110 15 minutes later   Notify physician (specify)   Vital signs   Patient may shower   Defer vaginal exam for vaginal bleeding or PROM <37 weeks   Initiate Oral Care Protocol   Initiate Carrier Fluid Protocol   Fetal monitoring   Continuous tocometry   Measure blood pressure   SCDs   Informed Consent Details: Physician/Practitioner Attestation; Transcribe to consent form and obtain patient signature   Full code   Consult to neonatology Reason for Consult? CHTN, possible severe preeclampsia, prematurity   ED EKG   EKG 12-Lead   Type and screen [facility]   Insert peripheral IV   Admit to Inpatient (patient's expected length of stay will be greater than 2 midnights or inpatient only procedure)   Seizure precautions    Meds ordered this encounter  Medications   traMADol (ULTRAM) tablet 100 mg   acetaminophen (TYLENOL) tablet 650 mg   zolpidem (AMBIEN) tablet 5  mg   docusate sodium (COLACE) capsule 100 mg   calcium carbonate (TUMS - dosed in mg elemental calcium) chewable tablet 400 mg of elemental calcium   prenatal multivitamin tablet 1 tablet   AND Linked Order Group    labetalol (NORMODYNE) injection 20 mg    labetalol (NORMODYNE) injection 40 mg    labetalol (NORMODYNE) injection 80 mg    hydrALAZINE (APRESOLINE) injection 10 mg   magnesium bolus via infusion 4 g   magnesium sulfate 40 grams in SWI 1000 mL OB infusion   lactated ringers infusion   betamethasone acetate-betamethasone sodium phosphate (CELESTONE) injection 12 mg     NST reviewed and reactive EKG with sinus tachycardia, rate 114 Pt  reported headache pain down slightly from 8/10 to 6/10 after Tramadol 100 mg PO Urine P/C ratio up from 0.54 on baseline labs on 08/19/20 to 3.41 today. Other PEC labs wnl  Consult Dr Macon Large with presentation, exam findings and test results Admit for observation of BP, headache management   Assessment: 1. Preeclampsia, second trimester superimposed on chronic hypertension   Concerned about this headache being a severe feature.  Plan: Admit to Antepartum Unit Mag sulfate 4 g bolus, 2 g/hour for eclampsia prophylaxis Continue analgesia for headaches Continue to monitor BP, has several normal BP recently Routine antenatal care.  Sharen Counter Certified Nurse-Midwife 11/24/2020 8:43 PM    Attestation of Attending Supervision of Advanced Practice Provider (PA/CNM/NP): Evaluation and management procedures were performed by the Advanced Practice Provider under my supervision and collaboration. Patient seen and examined, plan  reviewed.  I have also reviewed the Advanced Practice Provider's note and chart, and I agree with the management and plan. I have also made any necessary editorial changes.   Jaynie Collins, MD, FACOG Attending Obstetrician & Gynecologist, [facility] for [employer], [facility]

## 2020-11-24 NOTE — MAU Note (Signed)
Theresa Patterson is a 25 y.o. at [redacted]w[redacted]d here in MAU reporting: woke up with a headache, states it has been throbbing. Took 1000mg  tylenol at 0800 and Fioricet last night. Feels like she is having heart palpitations. DFM.  Onset of complaint: today  Pain score: 8/10  Vitals:   11/24/20 1716  BP: (!) 143/89  Pulse: (!) 110  Resp: 18  Temp: 98.2 F (36.8 C)  SpO2: 100%     FHT:144  Lab orders placed from triage: UA

## 2020-11-25 ENCOUNTER — Encounter: Payer: BC Managed Care – PPO | Admitting: Obstetrics & Gynecology

## 2020-11-25 ENCOUNTER — Encounter (HOSPITAL_COMMUNITY): Payer: Self-pay | Admitting: Obstetrics & Gynecology

## 2020-11-25 DIAGNOSIS — O26892 Other specified pregnancy related conditions, second trimester: Secondary | ICD-10-CM

## 2020-11-25 DIAGNOSIS — O26893 Other specified pregnancy related conditions, third trimester: Secondary | ICD-10-CM | POA: Diagnosis present

## 2020-11-25 DIAGNOSIS — O119 Pre-existing hypertension with pre-eclampsia, unspecified trimester: Secondary | ICD-10-CM

## 2020-11-25 DIAGNOSIS — Z3A28 28 weeks gestation of pregnancy: Secondary | ICD-10-CM

## 2020-11-25 DIAGNOSIS — R519 Headache, unspecified: Secondary | ICD-10-CM | POA: Diagnosis present

## 2020-11-25 MED ORDER — ONDANSETRON 4 MG PO TBDP
4.0000 mg | ORAL_TABLET | Freq: Four times a day (QID) | ORAL | Status: DC | PRN
  Administered 2020-11-25: 4 mg via ORAL
  Filled 2020-11-25: qty 1

## 2020-11-25 NOTE — Progress Notes (Signed)
    Faculty Practice OB/GYN Attending Note  Subjective:  Patient denies any current headache, it stopped around midnight. Has some mild nausea currently which she attributes to the magnesium sulfate. Otherwise, denies visual symptoms, RUQ/epigastric pain or other concerning symptoms.  FHR reassuring, no contractions, no LOF or vaginal bleeding. Good FM.   Admitted on 11/24/2020 for Chronic hypertension with superimposed preeclampsia, and headache concerning for possible severe feature    Objective:  Blood pressure 132/73, pulse 92, temperature 98.6 F (37 C), temperature source Oral, resp. rate 17, height 5\' 8"  (1.727 m), weight (!) 148 kg, last menstrual period 05/09/2020, SpO2 98 %. FHT  Baseline 125 bpm, moderate variability, + 10 x 10 accelerations, no decelerations Toco: quiet Gen: NAD HENT: Normocephalic, atraumatic Lungs: Normal respiratory effort Heart: Regular rate noted Abdomen: NT,gravid fundus, soft Cervix: Deferred Ext: 2+ DTRs, no edema, no cyanosis, negative Homan's sign  Labs: CMP Latest Ref Rng & Units 11/24/2020 08/19/2020  Glucose 70 - 99 mg/dL 82 72  BUN 6 - 20 mg/dL 5(L) 8  Creatinine 10/19/2020 - 1.00 mg/dL 1.32 4.40  Sodium 1.02 - 145 mmol/L 134(L) 136  Potassium 3.5 - 5.1 mmol/L 3.9 4.0  Chloride 98 - 111 mmol/L 105 101  CO2 22 - 32 mmol/L 21(L) 21  Calcium 8.9 - 10.3 mg/dL 9.4 9.9  Total Protein 6.5 - 8.1 g/dL 6.4(L) 6.7  Total Bilirubin 0.3 - 1.2 mg/dL 0.5 725  Alkaline Phos 38 - 126 U/L 92 74  AST 15 - 41 U/L 16 22  ALT 0 - 44 U/L 20 39(H)   CBC Latest Ref Rng & Units 11/24/2020 11/11/2020 08/19/2020  WBC 4.0 - 10.5 K/uL 9.2 8.0 8.9  Hemoglobin 12.0 - 15.0 g/dL 11.2(L) 12.0 11.2  Hematocrit 36.0 - 46.0 % 34.9(L) 36.7 35.0  Platelets 150 - 400 K/uL 312 320 334   Component Ref Range & Units 1 d ago   Creatinine, Urine mg/dL 10/19/2020   Total Protein, Urine mg/dL 64.40   Protein Creatinine Ratio 0.00 - 0.15 mg/mg 3.41 High       Assessment & Plan:  25 y.o.  G2P0010 at [redacted]w[redacted]d admitted for observation for headache not alleviated by several treatments, in the setting of newly diagnosed preeclampsia superimposed on chronic hypertension. - Headache is now resolved.  Will discontinue magnesium sulfate for now, continue to monitor closely - Stable BP for now - Category 1 FHR tracing - If remains stable for rest of morning, she may be able to be discharged with plans for close outpatient observation but will leave disposition plans to the oncoming team. Continue close observation.   [redacted]w[redacted]d, MD, FACOG Obstetrician & Gynecologist, [facility] for [facility]

## 2020-11-26 DIAGNOSIS — O1412 Severe pre-eclampsia, second trimester: Secondary | ICD-10-CM | POA: Diagnosis not present

## 2020-11-26 DIAGNOSIS — O119 Pre-existing hypertension with pre-eclampsia, unspecified trimester: Secondary | ICD-10-CM | POA: Diagnosis not present

## 2020-11-26 DIAGNOSIS — Z3A28 28 weeks gestation of pregnancy: Secondary | ICD-10-CM | POA: Diagnosis not present

## 2020-11-26 NOTE — Discharge Summary (Signed)
Patient ID: Theresa Patterson MRN: [id number] DOB/AGE: 18-Nov-1995 25 y.o.  Admit date: 11/24/2020 Discharge date: 11/26/2020  Admission Diagnoses: IUP 28 3/7 weeks, CHTN  Discharge Diagnoses: SAA, undeliveried  Prenatal Procedures: NST  Consults: None  Hospital Course:  This is a 25 y.o. G2P0010 with IUP at [redacted]w[redacted]d admitted for [facility] with HA, concern for development of SPEC. She was admitted to [facility], placed on Magnesium, BMZ x 2 and continuation of her home BP of Procardia. HA resolved. HELLP labs were negative. BP well controlled with Procardia. Fetal well being remained reassuring. Felt amendable for discharge home. Discharge medications, instructions and follow up were reviewed with pt. Pt verbalized understanding.   Discharge Exam: Temp:  [98.3 F (36.8 C)-99.3 F (37.4 C)] 98.4 F (36.9 C) (09/16 0739) Pulse Rate:  [81-97] 81 (09/16 0739) Resp:  [16-18] 16 (09/16 0739) BP: (107-130)/(53-73) 113/63 (09/16 0739) SpO2:  [97 %-100 %] 97 % (09/16 0739) Physical Examination: CONSTITUTIONAL: Well-developed, well-nourished female in no acute distress.  HENT:  Normocephalic, atraumatic, External right and left ear normal. Oropharynx is clear and moist EYES: Conjunctivae and EOM are normal. Pupils are equal, round, and reactive to light. No scleral icterus.  NECK: Normal range of motion, supple, no masses SKIN: Skin is warm and dry. No rash noted. Not diaphoretic. No erythema. No pallor. NEUROLGIC: Alert and oriented to person, place, and time. Normal reflexes, muscle tone coordination. No cranial nerve deficit noted. PSYCHIATRIC: Normal mood and affect. Normal behavior. Normal judgment and thought content. CARDIOVASCULAR: Normal heart rate noted, regular rhythm RESPIRATORY: Effort and breath sounds normal, no problems with respiration noted MUSCULOSKELETAL: Normal range of motion. No edema and no tenderness. 2+ distal pulses. ABDOMEN: Soft, nontender, nondistended, gravid. CERVIX:     Fetal monitoring: FHR: 130's bpm, Variability: moderate, Accelerations: Present, Decelerations: Absent  Uterine activity: no contractions per hour  Significant Diagnostic Studies:  Results for orders placed or performed during the hospital encounter of 11/24/20 (from the past 168 hour(s))  Urinalysis, Routine w reflex microscopic Urine, Clean Catch   Collection Time: 11/24/20  5:47 PM  Result Value Ref Range   Color, Urine YELLOW YELLOW   APPearance CLEAR CLEAR   Specific Gravity, Urine 1.006 1.005 - 1.030   pH 7.0 5.0 - 8.0   Glucose, UA NEGATIVE NEGATIVE mg/dL   Hgb urine dipstick NEGATIVE NEGATIVE   Bilirubin Urine NEGATIVE NEGATIVE   Ketones, ur NEGATIVE NEGATIVE mg/dL   Protein, ur NEGATIVE NEGATIVE mg/dL   Nitrite NEGATIVE NEGATIVE   Leukocytes,Ua SMALL (A) NEGATIVE   RBC / HPF 0-5 0 - 5 RBC/hpf   WBC, UA 0-5 0 - 5 WBC/hpf   Bacteria, UA NONE SEEN NONE SEEN   Squamous Epithelial / LPF 0-5 0 - 5  Protein / creatinine ratio, urine   Collection Time: 11/24/20  5:59 PM  Result Value Ref Range   Creatinine, Urine 56.25 mg/dL   Total Protein, Urine 192 mg/dL   Protein Creatinine Ratio 3.41 (H) 0.00 - 0.15 mg/mg[Cre]  CBC   Collection Time: 11/24/20  6:40 PM  Result Value Ref Range   WBC 9.2 4.0 - 10.5 K/uL   RBC 4.26 3.87 - 5.11 MIL/uL   Hemoglobin 11.2 (L) 12.0 - 15.0 g/dL   HCT 78.2 (L) 95.6 - 21.3 %   MCV 81.9 80.0 - 100.0 fL   MCH 26.3 26.0 - 34.0 pg   MCHC 32.1 30.0 - 36.0 g/dL   RDW 08.6 57.8 - 46.9 %   Platelets 312 150 -  400 K/uL   nRBC 0.0 0.0 - 0.2 %  Comprehensive metabolic panel   Collection Time: 11/24/20  6:40 PM  Result Value Ref Range   Sodium 134 (L) 135 - 145 mmol/L   Potassium 3.9 3.5 - 5.1 mmol/L   Chloride 105 98 - 111 mmol/L   CO2 21 (L) 22 - 32 mmol/L   Glucose, Bld 82 70 - 99 mg/dL   BUN 5 (L) 6 - 20 mg/dL   Creatinine, Ser 7.34 0.44 - 1.00 mg/dL   Calcium 9.4 8.9 - 19.3 mg/dL   Total Protein 6.4 (L) 6.5 - 8.1 g/dL   Albumin 2.8 (L)  3.5 - 5.0 g/dL   AST 16 15 - 41 U/L   ALT 20 0 - 44 U/L   Alkaline Phosphatase 92 38 - 126 U/L   Total Bilirubin 0.5 0.3 - 1.2 mg/dL   GFR, Estimated >79 >02 mL/min   Anion gap 8 5 - 15  Resp Panel by RT-PCR (Flu A&B, Covid) Nasopharyngeal Swab   Collection Time: 11/24/20  9:24 PM   Specimen: Nasopharyngeal Swab; Nasopharyngeal(NP) swabs in vial transport medium  Result Value Ref Range   SARS Coronavirus 2 by RT PCR NEGATIVE NEGATIVE   Influenza A by PCR NEGATIVE NEGATIVE   Influenza B by PCR NEGATIVE NEGATIVE  Type and screen [facility]   Collection Time: 11/24/20  9:24 PM  Result Value Ref Range   ABO/RH(D) O POS    Antibody Screen NEG    Sample Expiration      11/27/2020,2359 Performed at [facility], [address], [place], [place] [address]     Discharge Condition: Stable  Disposition: Discharge disposition: 01-Home or Self Care        Discharge Instructions     Discharge activity:  No Restrictions   Complete by: As directed    Discharge diet:  No restrictions   Complete by: As directed    Discharge instructions   Complete by: As directed    Monitor BP daily, call if > 140/90   Fetal Kick Count:  Lie on our left side for one hour after a meal, and count the number of times your baby kicks.  If it is less than 5 times, get up, move around and drink some juice.  Repeat the test 30 minutes later.  If it is still less than 5 kicks in an hour, notify your doctor.   Complete by: As directed    No sexual activity restrictions   Complete by: As directed    Notify physician for a general feeling that "something is not right"   Complete by: As directed    Notify physician for increase or change in vaginal discharge   Complete by: As directed    Notify physician for intestinal cramps, with or without diarrhea, sometimes described as "gas pain"   Complete by: As directed    Notify physician for leaking of fluid   Complete by: As directed     Notify physician for low, dull backache, unrelieved by heat or Tylenol   Complete by: As directed    Notify physician for menstrual like cramps   Complete by: As directed    Notify physician for pelvic pressure   Complete by: As directed    Notify physician for uterine contractions.  These may be painless and feel like the uterus is tightening or the baby is  "balling up"   Complete by: As directed  Notify physician for vaginal bleeding   Complete by: As directed    PRETERM LABOR:  Includes any of the follwing symptoms that occur between 20 - [redacted] weeks gestation.  If these symptoms are not stopped, preterm labor can result in preterm delivery, placing your baby at risk   Complete by: As directed       Allergies as of 11/26/2020   No Known Allergies      Medication List     TAKE these medications    aspirin EC 81 MG tablet Take 1 tablet (81 mg total) by mouth daily.   Butalbital-APAP-Caffeine 50-325-40 MG capsule Take 1-2 capsules by mouth every 6 (six) hours as needed for headache.   cyclobenzaprine 10 MG tablet Commonly known as: FLEXERIL Take 1 tablet (10 mg total) by mouth 2 (two) times daily as needed for muscle spasms.   doxylamine (Sleep) 25 MG tablet Commonly known as: UNISOM Take 25 mg by mouth at bedtime as needed.   metoCLOPramide 5 MG tablet Commonly known as: Reglan Take 1 tablet (5 mg total) by mouth 3 (three) times daily before meals.   NIFEdipine 30 MG 24 hr tablet Commonly known as: PROCARDIA-XL/NIFEDICAL-XL Take 1 tablet (30 mg total) by mouth daily.   omeprazole 40 MG capsule Commonly known as: PRILOSEC Take 1 capsule (40 mg total) by mouth 2 (two) times daily.   PrePLUS 27-1 MG Tabs Take 1 tablet by mouth daily.   vitamin B-6 25 MG tablet Commonly known as: pyridOXINE Take 25 mg by mouth daily.        Follow-up Information     [facility] Follow up.   Specialty: Obstetrics and Gynecology Why: Pt already has  OB appt 11/30/20 Contact information: [address] [place] [address] [phone]                Signed: Hermina Staggers M.D. 11/26/2020, 9:41 AM

## 2020-11-27 ENCOUNTER — Other Ambulatory Visit: Payer: Self-pay

## 2020-11-27 ENCOUNTER — Inpatient Hospital Stay (HOSPITAL_COMMUNITY)
Admission: AD | Admit: 2020-11-27 | Discharge: 2020-11-28 | Disposition: A | Discharge status: Home / Self Care | Payer: BC Managed Care – PPO | Attending: Obstetrics & Gynecology | Admitting: Obstetrics & Gynecology

## 2020-11-27 ENCOUNTER — Encounter (HOSPITAL_COMMUNITY): Payer: Self-pay | Admitting: Obstetrics & Gynecology

## 2020-11-27 DIAGNOSIS — Z79899 Other long term (current) drug therapy: Secondary | ICD-10-CM | POA: Diagnosis not present

## 2020-11-27 DIAGNOSIS — G8929 Other chronic pain: Secondary | ICD-10-CM

## 2020-11-27 DIAGNOSIS — Z3A28 28 weeks gestation of pregnancy: Secondary | ICD-10-CM | POA: Diagnosis not present

## 2020-11-27 DIAGNOSIS — Z7982 Long term (current) use of aspirin: Secondary | ICD-10-CM | POA: Diagnosis not present

## 2020-11-27 DIAGNOSIS — O26893 Other specified pregnancy related conditions, third trimester: Secondary | ICD-10-CM

## 2020-11-27 DIAGNOSIS — D563 Thalassemia minor: Secondary | ICD-10-CM

## 2020-11-27 DIAGNOSIS — O1493 Unspecified pre-eclampsia, third trimester: Secondary | ICD-10-CM | POA: Diagnosis not present

## 2020-11-27 DIAGNOSIS — R519 Headache, unspecified: Secondary | ICD-10-CM

## 2020-11-27 DIAGNOSIS — O119 Pre-existing hypertension with pre-eclampsia, unspecified trimester: Secondary | ICD-10-CM

## 2020-11-27 DIAGNOSIS — O99891 Other specified diseases and conditions complicating pregnancy: Secondary | ICD-10-CM

## 2020-11-27 DIAGNOSIS — O99353 Diseases of the nervous system complicating pregnancy, third trimester: Secondary | ICD-10-CM | POA: Diagnosis not present

## 2020-11-27 MED ORDER — PROMETHAZINE HCL 25 MG PO TABS
25.0000 mg | ORAL_TABLET | Freq: Once | ORAL | Status: AC
  Administered 2020-11-27: 25 mg via ORAL
  Filled 2020-11-27: qty 1

## 2020-11-27 MED ORDER — CYCLOBENZAPRINE HCL 5 MG PO TABS
10.0000 mg | ORAL_TABLET | Freq: Once | ORAL | Status: AC
  Administered 2020-11-27: 10 mg via ORAL
  Filled 2020-11-27: qty 2

## 2020-11-27 NOTE — MAU Provider Note (Signed)
History     CSN: [id number]  Arrival date and time: 11/27/20 2041   Event Date/Time   First Provider Initiated Contact with Patient 11/27/20 2226      Chief Complaint  Patient presents with   Hypertension   Headache   Mahala Rommel is a 25 y.o. G2P0010 at [redacted]w[redacted]d who receives care at [facility].  She presents today for Hypertension and Headache. She states she had a headache that started around 11am and took her blood pressure two hours later with mild elevation at 140/91.  She states she took tylenol, but the headache returned at 1600.  She states that at 1830 she started "whoozy in the head" and decided to lay down. She reports her blood pressure at 2000 was 150/92. She states she she did not repeat the blood pressure and came to the hospital.  She states she has a headache currently that is located in the "back of my head." She describes it as "throbbing and tightening."  She rates it a 7/10.  She endorses a history of headaches, prior to pregnancy, and reports "I have had problems with them before."  Patient reports she took her procardia at 0900.    OB History     Gravida  2   Para      Term      Preterm      AB  1   Living         SAB  1   IAB      Ectopic      Multiple      Live Births              Past Medical History:  Diagnosis Date   Alpha thalassemia silent carrier 10/15/2020   Chronic headaches 09/30/2020   Hypertension    Morbid obesity (HCC) 08/20/2020   Polycystic ovary syndrome     Past Surgical History:  Procedure Laterality Date   NO PAST SURGERIES      Family History  Problem Relation Age of Onset   Obesity Mother    Obesity Maternal Grandmother     Social History   Tobacco Use   Smoking status: Never   Smokeless tobacco: Never  Vaping Use   Vaping Use: Never used  Substance Use Topics   Alcohol use: Not Currently    Comment: not since confirmed pregnancy   Drug use: Not Currently    Allergies: No Known  Allergies  Medications Prior to Admission  Medication Sig Dispense Refill Last Dose   aspirin EC 81 MG tablet Take 1 tablet (81 mg total) by mouth daily. 60 tablet 2    Butalbital-APAP-Caffeine 50-325-40 MG capsule Take 1-2 capsules by mouth every 6 (six) hours as needed for headache. 30 capsule 1    cyclobenzaprine (FLEXERIL) 10 MG tablet Take 1 tablet (10 mg total) by mouth 2 (two) times daily as needed for muscle spasms. 20 tablet 0    doxylamine, Sleep, (UNISOM) 25 MG tablet Take 25 mg by mouth at bedtime as needed.      metoCLOPramide (REGLAN) 5 MG tablet Take 1 tablet (5 mg total) by mouth 3 (three) times daily before meals. 90 tablet 1    NIFEdipine (PROCARDIA-XL/NIFEDICAL-XL) 30 MG 24 hr tablet Take 1 tablet (30 mg total) by mouth daily. 60 tablet 2    omeprazole (PRILOSEC) 40 MG capsule Take 1 capsule (40 mg total) by mouth 2 (two) times daily. 60 capsule 2    Prenatal Vit-Fe Fumarate-FA (PREPLUS) 27-1  MG TABS Take 1 tablet by mouth daily. 30 tablet 13    vitamin B-6 (PYRIDOXINE) 25 MG tablet Take 25 mg by mouth daily.       Review of Systems  Constitutional:  Negative for chills and fever.  Eyes:  Negative for visual disturbance.  Gastrointestinal:  Negative for constipation, diarrhea, nausea and vomiting.  Genitourinary:  Negative for vaginal bleeding and vaginal discharge.  Neurological:  Positive for headaches. Negative for dizziness and light-headedness.  Physical Exam   Blood pressure 131/71, pulse 97, last menstrual period 05/09/2020, SpO2 99 %. Vitals:   11/27/20 2221 11/27/20 2231 11/27/20 2317 11/27/20 2332  BP:  124/72 (!) 111/59 107/65  Pulse:  (!) 101 88 91  SpO2: 99% 99%      Physical Exam Vitals reviewed.  Constitutional:      Appearance: Normal appearance. She is well-developed. She is obese.  HENT:     Head: Normocephalic and atraumatic.  Cardiovascular:     Rate and Rhythm: Normal rate.  Pulmonary:     Effort: Pulmonary effort is normal. No  respiratory distress.  Musculoskeletal:        General: Normal range of motion.     Cervical back: Normal range of motion.  Skin:    General: Skin is warm and dry.  Neurological:     Mental Status: She is alert and oriented to person, place, and time.  Psychiatric:        Mood and Affect: Mood normal.        Behavior: Behavior normal.    Fetal Assessment  bpm, Mod Var, -Decels, +Accels Toco: No ctx graphed  MAU Course  No results found for this or any previous visit (from the past 24 hour(s)). No results found.  MDM PE Labs:None EFM Muscle Relaxant Assessment and Plan  25 year old G2P0010  SIUP at 28.6 weeks Cat I FT PreEclampsia Headache-Chronic  -POC Reviewed -Informed that will not perform labs as PreE already diagnosed. -Will focus on treatment of HA and if successful with discharge home. -If unsuccessful will collect labs and develop POC as appropriate. -Patient verbalizes understanding and without questions.  -Exam performed -Encouraged to take bp cuff to office for collaboration.  -Reviewed blood pressure elevations that require MAU visit: >/=160/110 -Discussed treatment of HA with oral medications. -Patient reports no relief with Fioricet dosing. -Discussed treatment with IV medications. 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 -Dr. Katharine Look consulted and informed of patient status, evaluation, interventions, and results. Advised: *Agrees with no need to collect PrE labs. *Treat HA with Flexeril and Phenergan. *If treatment unsuccessful collect labs.  *Keep appt as scheduled for Tuesday. -Patient updated on POC. No questions. -Will monitor and reassess.  -NST reassuring for GA   Cherre Robins MSN, CNM 11/27/2020, 10:26 PM   Reassessment (12:06 AM)  -Patient requesting discharge. -Reports HA now 5/10 -Informed that provider will discharge since improvement is occurring. -Patient without questions. -Instructed to keep appt as scheduled for  Tuesday. -Encouraged to call primary office or return to MAU if symptoms worsen or with the onset of new symptoms. -Discharged to home in improving condition.  Cherre Robins MSN, CNM Advanced Practice Provider, [facility] [employer]

## 2020-11-27 NOTE — MAU Note (Addendum)
Chronic Hypertension with protein in urine on Wednesday. Admitted then. And discharged yesterday. Procardia once a day, took at 9 am.  Blood pressure at home was 150/92.  Headache and seeing spots.  No bleeding. No leaking. Baby moving well.  Chest pain at 7:45 pm but is gone now.

## 2020-11-30 ENCOUNTER — Ambulatory Visit (INDEPENDENT_AMBULATORY_CARE_PROVIDER_SITE_OTHER): Payer: BC Managed Care – PPO | Admitting: Obstetrics & Gynecology

## 2020-11-30 ENCOUNTER — Other Ambulatory Visit: Payer: Self-pay

## 2020-11-30 VITALS — BP 119/78 | HR 106 | Wt 324.0 lb

## 2020-11-30 DIAGNOSIS — G8929 Other chronic pain: Secondary | ICD-10-CM

## 2020-11-30 DIAGNOSIS — O0993 Supervision of high risk pregnancy, unspecified, third trimester: Secondary | ICD-10-CM

## 2020-11-30 DIAGNOSIS — R519 Headache, unspecified: Secondary | ICD-10-CM

## 2020-11-30 DIAGNOSIS — O119 Pre-existing hypertension with pre-eclampsia, unspecified trimester: Secondary | ICD-10-CM

## 2020-11-30 NOTE — Progress Notes (Signed)
   PRENATAL VISIT NOTE  Subjective:  Theresa Patterson is a 25 y.o. G2P0010 at [redacted]w[redacted]d being seen today for ongoing prenatal care.  She is currently monitored for the following issues for this high-risk pregnancy and has Supervision of high-risk pregnancy; Maternal morbid obesity, antepartum (HCC); Chronic hypertension with superimposed preeclampsia; Morbid obesity (HCC); Chronic headaches; Alpha thalassemia silent carrier; and Headache in pregnancy, antepartum, third trimester on their problem list.  Patient reports nausea.  Contractions: Not present. Vag. Bleeding: None.  Movement: Present. Denies leaking of fluid.   The following portions of the patient's history were reviewed and updated as appropriate: allergies, current medications, past family history, past medical history, past social history, past surgical history and problem list.   Objective:   Vitals:   11/30/20 0911  BP: 119/78  Pulse: (!) 106  Weight: (!) 324 lb (147 kg)    Fetal Status:     Movement: Present     General:  Alert, oriented and cooperative. Patient is in no acute distress.  Skin: Skin is warm and dry. No rash noted.   Cardiovascular: Normal heart rate noted  Respiratory: Normal respiratory effort, no problems with respiration noted  Abdomen: Soft, gravid, appropriate for gestational age.  Pain/Pressure: Absent     Pelvic: Cervical exam deferred        Extremities: Normal range of motion.  Edema: None  Mental Status: Normal mood and affect. Normal behavior. Normal judgment and thought content.   Assessment and Plan:  Pregnancy: G2P0010 at [redacted]w[redacted]d 1. Chronic nonintractable headache, unspecified headache type Controlled now  2. Supervision of high risk pregnancy in third trimester F/u US 10/7  3. Chronic hypertension with superimposed preeclampsia Procardia 30 mg   Preterm labor symptoms and general obstetric precautions including but not limited to vaginal bleeding, contractions, leaking of fluid and fetal  movement were reviewed in detail with the patient. Please refer to After Visit Summary for other counseling recommendations.   Return in about 2 weeks (around 12/14/2020).  Future Appointments  Date Time Provider Department Center  12/09/2020 12:15 PM Estelle June, NP PP-PIEDPED [facility]  12/17/2020 10:45 AM [facility] NURSE [facility]  12/17/2020 11:00 AM [facility] US1 [facility]    Scheryl Darter, MD

## 2020-12-02 ENCOUNTER — Other Ambulatory Visit: Payer: Self-pay

## 2020-12-02 ENCOUNTER — Encounter (HOSPITAL_COMMUNITY): Payer: Self-pay | Admitting: Obstetrics and Gynecology

## 2020-12-02 ENCOUNTER — Telehealth: Payer: Self-pay

## 2020-12-02 ENCOUNTER — Inpatient Hospital Stay (HOSPITAL_COMMUNITY)
Admission: AD | Admit: 2020-12-02 | Discharge: 2020-12-02 | Disposition: A | Discharge status: Home / Self Care | Payer: BC Managed Care – PPO | Attending: Family Medicine | Admitting: Family Medicine

## 2020-12-02 DIAGNOSIS — O10013 Pre-existing essential hypertension complicating pregnancy, third trimester: Secondary | ICD-10-CM | POA: Insufficient documentation

## 2020-12-02 DIAGNOSIS — Z7982 Long term (current) use of aspirin: Secondary | ICD-10-CM | POA: Insufficient documentation

## 2020-12-02 DIAGNOSIS — Z3A29 29 weeks gestation of pregnancy: Secondary | ICD-10-CM | POA: Insufficient documentation

## 2020-12-02 DIAGNOSIS — R519 Headache, unspecified: Secondary | ICD-10-CM | POA: Diagnosis present

## 2020-12-02 DIAGNOSIS — O10913 Unspecified pre-existing hypertension complicating pregnancy, third trimester: Secondary | ICD-10-CM

## 2020-12-02 DIAGNOSIS — O26893 Other specified pregnancy related conditions, third trimester: Secondary | ICD-10-CM

## 2020-12-02 DIAGNOSIS — G8929 Other chronic pain: Secondary | ICD-10-CM

## 2020-12-02 DIAGNOSIS — O99213 Obesity complicating pregnancy, third trimester: Secondary | ICD-10-CM | POA: Insufficient documentation

## 2020-12-02 DIAGNOSIS — D563 Thalassemia minor: Secondary | ICD-10-CM

## 2020-12-02 DIAGNOSIS — O119 Pre-existing hypertension with pre-eclampsia, unspecified trimester: Secondary | ICD-10-CM

## 2020-12-02 LAB — COMPREHENSIVE METABOLIC PANEL
ALT: 16 U/L (ref 0–44)
AST: 15 U/L (ref 15–41)
Albumin: 2.8 g/dL — ABNORMAL LOW (ref 3.5–5.0)
Alkaline Phosphatase: 90 U/L (ref 38–126)
Anion gap: 6 (ref 5–15)
BUN: 6 mg/dL (ref 6–20)
CO2: 23 mmol/L (ref 22–32)
Calcium: 9.5 mg/dL (ref 8.9–10.3)
Chloride: 105 mmol/L (ref 98–111)
Creatinine, Ser: 0.62 mg/dL (ref 0.44–1.00)
GFR, Estimated: >60 mL/min (ref >60)
Glucose, Bld: 61 mg/dL — ABNORMAL LOW (ref 70–99)
Potassium: 3.9 mmol/L (ref 3.5–5.1)
Sodium: 134 mmol/L — ABNORMAL LOW (ref 135–145)
Total Bilirubin: 0.8 mg/dL (ref 0.3–1.2)
Total Protein: 6.6 g/dL (ref 6.5–8.1)

## 2020-12-02 LAB — CBC
HCT: 35.1 % — ABNORMAL LOW (ref 36.0–46.0)
Hemoglobin: 11.3 g/dL — ABNORMAL LOW (ref 12.0–15.0)
MCH: 26.3 pg (ref 26.0–34.0)
MCHC: 32.2 g/dL (ref 30.0–36.0)
MCV: 81.6 fL (ref 80.0–100.0)
Platelets: 306 10*3/uL (ref 150–400)
RBC: 4.3 MIL/uL (ref 3.87–5.11)
RDW: 15 % (ref 11.5–15.5)
WBC: 10.1 10*3/uL (ref 4.0–10.5)
nRBC: 0 % (ref 0.0–0.2)

## 2020-12-02 LAB — PROTEIN / CREATININE RATIO, URINE
Creatinine, Urine: 256.38 mg/dL
Protein Creatinine Ratio: 0.05 mg/mg{Cre} (ref 0.00–0.15)
Total Protein, Urine: 14 mg/dL

## 2020-12-02 MED ORDER — ACETAMINOPHEN 500 MG PO TABS
1000.0000 mg | ORAL_TABLET | Freq: Once | ORAL | Status: AC
  Administered 2020-12-02: 1000 mg via ORAL
  Filled 2020-12-02: qty 2

## 2020-12-02 MED ORDER — METOCLOPRAMIDE HCL 10 MG PO TABS
10.0000 mg | ORAL_TABLET | Freq: Once | ORAL | Status: AC
  Administered 2020-12-02: 10 mg via ORAL
  Filled 2020-12-02: qty 1

## 2020-12-02 MED ORDER — MAGNESIUM 400 MG PO TABS: 1.0000 | ORAL_TABLET | Freq: Two times a day (BID) | ORAL | 5 refills | Status: DC

## 2020-12-02 MED ORDER — CYCLOBENZAPRINE HCL 10 MG PO TABS: 10.0000 mg | ORAL_TABLET | Freq: Two times a day (BID) | ORAL | 2 refills | Status: DC | PRN

## 2020-12-02 MED ORDER — CYCLOBENZAPRINE HCL 5 MG PO TABS
10.0000 mg | ORAL_TABLET | Freq: Once | ORAL | Status: AC
  Administered 2020-12-02: 10 mg via ORAL
  Filled 2020-12-02: qty 2

## 2020-12-02 NOTE — Telephone Encounter (Signed)
Spoke with patient after MyChart messages and patient is symptomatic. HA, black spots in front of the eyes, and some stomach pain, and shortness of breath. After speaking with Dr. Debroah Loop about elevation in BP and symptoms he would like patient to be evaluated at MAU. Relayed information to patient. She agrees and will head there for evaluation. Pt states she does not feel well but did recheck BP and it had gone down slightly to 138/83. Encouraged pt to still seek eval due to symptoms and increased BP. Pt agreed and verbalized understanding.

## 2020-12-02 NOTE — MAU Provider Note (Signed)
History     [id number]  Arrival date and time: 12/02/20 1101    Chief Complaint  Patient presents with   Headache     HPI Theresa Patterson is a 25 y.o. at [redacted]w[redacted]d with PMHx notable for cHTN on procardia, chronic headaches, and recent admission for possible PreE, who presents for headache and elevated blood pressure.   Review of discharge summary from last admission on 11/26/2020: admitted for headache with c/f possible PreE superimposed on cHTN. Ultimately headache resolved, labs and BP were normal, d/c to home.   Today patient reports she was getting elevated BP readings at home, 140-150/90's and had a headache She called her OB office and was told to come to MAU for evaluation She denies vaginal bleeding or loss of fluid No abdominal pain Has normal fetal movement No contractions She endorses daily chronic headaches that precede her pregnancy She also notes she sometimes wakes up gasping for air in the middle of the night Often is sleepy during the day and frequently dozes off She denies chest pain or shortness of breath She denies RUQ pain   --/--/O POS (09/14 2124)  OB History     Gravida  2   Para      Term      Preterm      AB  1   Living         SAB  1   IAB      Ectopic      Multiple      Live Births              Past Medical History:  Diagnosis Date   Alpha thalassemia silent carrier 10/15/2020   Chronic headaches 09/30/2020   Hypertension    Morbid obesity (HCC) 08/20/2020   Polycystic ovary syndrome     Past Surgical History:  Procedure Laterality Date   NO PAST SURGERIES      Family History  Problem Relation Age of Onset   Obesity Mother    Obesity Maternal Grandmother     Social History   Socioeconomic History   Marital status: Single    Spouse name: Not on file   Number of children: Not on file   Years of education: Not on file   Highest education level: Not on file  Occupational History   Not on file  Tobacco Use    Smoking status: Never   Smokeless tobacco: Never  Vaping Use   Vaping Use: Never used  Substance and Sexual Activity   Alcohol use: Not Currently    Comment: not since confirmed pregnancy   Drug use: Not Currently   Sexual activity: Yes    Partners: Male    Birth control/protection: None  Other Topics Concern   Not on file  Social History Narrative   Not on file   Social Determinants of Health   Financial Resource Strain: Not on file  Food Insecurity: Not on file  Transportation Needs: Not on file  Physical Activity: Not on file  Stress: Not on file  Social Connections: Not on file  Intimate Partner Violence: Not on file    No Known Allergies  No current facility-administered medications on file prior to encounter.   Current Outpatient Medications on File Prior to Encounter  Medication Sig Dispense Refill   aspirin EC 81 MG tablet Take 1 tablet (81 mg total) by mouth daily. 60 tablet 2   metoCLOPramide (REGLAN) 5 MG tablet Take 1 tablet (5 mg total) by  mouth 3 (three) times daily before meals. 90 tablet 1   NIFEdipine (PROCARDIA-XL/NIFEDICAL-XL) 30 MG 24 hr tablet Take 1 tablet (30 mg total) by mouth daily. 60 tablet 2   omeprazole (PRILOSEC) 40 MG capsule Take 1 capsule (40 mg total) by mouth 2 (two) times daily. 60 capsule 2   Prenatal Vit-Fe Fumarate-FA (PREPLUS) 27-1 MG TABS Take 1 tablet by mouth daily. 30 tablet 13   Butalbital-APAP-Caffeine 50-325-40 MG capsule Take 1-2 capsules by mouth every 6 (six) hours as needed for headache. (Patient not taking: Reported on 11/30/2020) 30 capsule 1   doxylamine, Sleep, (UNISOM) 25 MG tablet Take 25 mg by mouth at bedtime as needed. (Patient not taking: Reported on 11/30/2020)     vitamin B-6 (PYRIDOXINE) 25 MG tablet Take 25 mg by mouth daily.       ROS Pertinent positives and negative per HPI, all others reviewed and negative  Physical Exam   BP 130/86   Pulse (!) 123   Temp 98.8 F (37.1 C)   Resp 18   LMP  05/09/2020   SpO2 99%   Patient Vitals for the past 24 hrs:  BP Temp Pulse Resp SpO2  12/02/20 1201 130/86 -- (!) 123 -- --  12/02/20 1200 -- -- -- -- 99 %  12/02/20 1146 139/83 -- (!) 122 -- --  12/02/20 1145 -- -- -- -- 99 %  12/02/20 1140 -- -- -- -- 99 %  12/02/20 1135 -- -- -- -- 98 %  12/02/20 1133 (!) 143/83 -- (!) 127 -- --  12/02/20 1132 (!) 143/83 98.8 F (37.1 C) (!) 119 18 98 %    Physical Exam Vitals reviewed.  Constitutional:      General: She is not in acute distress.    Appearance: She is well-developed. She is not diaphoretic.  Eyes:     General: No scleral icterus. Pulmonary:     Effort: Pulmonary effort is normal. No respiratory distress.  Abdominal:     General: There is no distension.     Palpations: Abdomen is soft.     Tenderness: There is no abdominal tenderness. There is no guarding or rebound.  Skin:    General: Skin is warm and dry.  Neurological:     Mental Status: She is alert.     Coordination: Coordination normal.     Cervical Exam    Bedside Ultrasound Not done  My interpretation: n/a  FHT Baseline 145, moderate variability, no accels, no decels Toco: quiet Cat: I  Labs Results for orders placed or performed during the hospital encounter of 12/02/20 (from the past 24 hour(s))  Protein / creatinine ratio, urine     Status: None   Collection Time: 12/02/20 11:28 AM  Result Value Ref Range   Creatinine, Urine 256.38 mg/dL   Total Protein, Urine 14 mg/dL   Protein Creatinine Ratio 0.05 0.00 - 0.15 mg/mg[Cre]  CBC     Status: Abnormal   Collection Time: 12/02/20 12:40 PM  Result Value Ref Range   WBC 10.1 4.0 - 10.5 K/uL   RBC 4.30 3.87 - 5.11 MIL/uL   Hemoglobin 11.3 (L) 12.0 - 15.0 g/dL   HCT 66.4 (L) 40.3 - 47.4 %   MCV 81.6 80.0 - 100.0 fL   MCH 26.3 26.0 - 34.0 pg   MCHC 32.2 30.0 - 36.0 g/dL   RDW 25.9 56.3 - 87.5 %   Platelets 306 150 - 400 K/uL   nRBC 0.0 0.0 - 0.2 %  Comprehensive metabolic panel     Status:  Abnormal   Collection Time: 12/02/20 12:40 PM  Result Value Ref Range   Sodium 134 (L) 135 - 145 mmol/L   Potassium 3.9 3.5 - 5.1 mmol/L   Chloride 105 98 - 111 mmol/L   CO2 23 22 - 32 mmol/L   Glucose, Bld 61 (L) 70 - 99 mg/dL   BUN 6 6 - 20 mg/dL   Creatinine, Ser 4.09 0.44 - 1.00 mg/dL   Calcium 9.5 8.9 - 81.1 mg/dL   Total Protein 6.6 6.5 - 8.1 g/dL   Albumin 2.8 (L) 3.5 - 5.0 g/dL   AST 15 15 - 41 U/L   ALT 16 0 - 44 U/L   Alkaline Phosphatase 90 38 - 126 U/L   Total Bilirubin 0.8 0.3 - 1.2 mg/dL   GFR, Estimated >91 >47 mL/min   Anion gap 6 5 - 15    Imaging No results found.  MAU Course  Procedures  Lab Orders         CBC         Comprehensive metabolic panel         Protein / creatinine ratio, urine    Meds ordered this encounter  Medications   acetaminophen (TYLENOL) tablet 1,000 mg   cyclobenzaprine (FLEXERIL) tablet 10 mg   metoCLOPramide (REGLAN) tablet 10 mg   cyclobenzaprine (FLEXERIL) 10 MG tablet    Sig: Take 1 tablet (10 mg total) by mouth 2 (two) times daily as needed for muscle spasms.    Dispense:  30 tablet    Refill:  2   Magnesium 400 MG TABS    Sig: Take 1 tablet by mouth in the morning and at bedtime.    Dispense:  60 tablet    Refill:  5   Imaging Orders  No imaging studies ordered today    MDM moderate  Assessment and Plan  #Headache in pregnancy, second trimester #cHTN #Likely OSA Patient treated with tylenol, flexeril, and reglan with some improvement in her headache. PreE labs obtained and were normal. BP's were mostly normal during her MAU stay. On the whole given her BMI and history she likely has undiagnosed OSA and reports several classic symptoms. Recommend outpatient sleep study eval ASAP, I placed this order at discharge. Also recommended starting magnesium for headache prophylaxis. Will send message to next OB provider to make sure they follow up on this referral.    #FWB FHT Cat I NST: Non-reactive but reassuring  feature, unable to monitor well due to GA and habitus   Discharged to home in stable condition.  Discharge Instructions     Ambulatory referral to Sleep Studies   Complete by: As directed    [redacted] weeks pregnant, daily headaches, sleepiness, wakes up gasping for air, ruled out for pre-eclampsia, needs eval ASAP as she is getting frequent pre-e evals when she most likely just has OSA.       Venora Maples, MD/MPH 12/02/20 2:51 PM  Allergies as of 12/02/2020   No Known Allergies      Medication List     STOP taking these medications    Butalbital-APAP-Caffeine 50-325-40 MG capsule   doxylamine (Sleep) 25 MG tablet Commonly known as: UNISOM       TAKE these medications    aspirin EC 81 MG tablet Take 1 tablet (81 mg total) by mouth daily.   cyclobenzaprine 10 MG tablet Commonly known as: FLEXERIL Take 1 tablet (10 mg total) by  mouth 2 (two) times daily as needed for muscle spasms.   Magnesium 400 MG Tabs Take 1 tablet by mouth in the morning and at bedtime.   metoCLOPramide 5 MG tablet Commonly known as: Reglan Take 1 tablet (5 mg total) by mouth 3 (three) times daily before meals.   NIFEdipine 30 MG 24 hr tablet Commonly known as: PROCARDIA-XL/NIFEDICAL-XL Take 1 tablet (30 mg total) by mouth daily.   omeprazole 40 MG capsule Commonly known as: PRILOSEC Take 1 capsule (40 mg total) by mouth 2 (two) times daily.   PrePLUS 27-1 MG Tabs Take 1 tablet by mouth daily.   vitamin B-6 25 MG tablet Commonly known as: pyridOXINE Take 25 mg by mouth daily.

## 2020-12-02 NOTE — MAU Note (Signed)
Pt presents with complaint of headache off/on for a few days, some "floaters/spots". Reports b/p at home prior to arrival 130's/80's. Called office and states rn reports for her to present here. Reports good fetal movment. Denies abd pain, vaginal bleeding or ROM

## 2020-12-05 ENCOUNTER — Other Ambulatory Visit: Payer: Self-pay

## 2020-12-06 ENCOUNTER — Other Ambulatory Visit: Payer: Self-pay | Admitting: Obstetrics and Gynecology

## 2020-12-06 MED ORDER — PANTOPRAZOLE SODIUM 40 MG PO TBEC: 40.0000 mg | DELAYED_RELEASE_TABLET | Freq: Every day | ORAL | 1 refills | Status: DC

## 2020-12-07 ENCOUNTER — Encounter (HOSPITAL_COMMUNITY): Payer: Self-pay | Admitting: Obstetrics & Gynecology

## 2020-12-07 ENCOUNTER — Inpatient Hospital Stay (HOSPITAL_COMMUNITY): Payer: BC Managed Care – PPO

## 2020-12-07 ENCOUNTER — Inpatient Hospital Stay (HOSPITAL_COMMUNITY)
Admission: AD | Admit: 2020-12-07 | Discharge: 2020-12-07 | Disposition: A | Discharge status: Home / Self Care | Payer: BC Managed Care – PPO | Attending: Obstetrics & Gynecology | Admitting: Obstetrics & Gynecology

## 2020-12-07 ENCOUNTER — Other Ambulatory Visit: Payer: Self-pay

## 2020-12-07 ENCOUNTER — Ambulatory Visit (INDEPENDENT_AMBULATORY_CARE_PROVIDER_SITE_OTHER): Payer: BC Managed Care – PPO | Admitting: Advanced Practice Midwife

## 2020-12-07 VITALS — BP 150/96 | HR 125 | Wt 326.0 lb

## 2020-12-07 DIAGNOSIS — D563 Thalassemia minor: Secondary | ICD-10-CM

## 2020-12-07 DIAGNOSIS — O26893 Other specified pregnancy related conditions, third trimester: Secondary | ICD-10-CM

## 2020-12-07 DIAGNOSIS — R519 Headache, unspecified: Secondary | ICD-10-CM

## 2020-12-07 DIAGNOSIS — O26843 Uterine size-date discrepancy, third trimester: Secondary | ICD-10-CM

## 2020-12-07 DIAGNOSIS — O10013 Pre-existing essential hypertension complicating pregnancy, third trimester: Secondary | ICD-10-CM | POA: Insufficient documentation

## 2020-12-07 DIAGNOSIS — O0993 Supervision of high risk pregnancy, unspecified, third trimester: Secondary | ICD-10-CM

## 2020-12-07 DIAGNOSIS — O119 Pre-existing hypertension with pre-eclampsia, unspecified trimester: Secondary | ICD-10-CM

## 2020-12-07 DIAGNOSIS — O10913 Unspecified pre-existing hypertension complicating pregnancy, third trimester: Secondary | ICD-10-CM

## 2020-12-07 DIAGNOSIS — Z3A30 30 weeks gestation of pregnancy: Secondary | ICD-10-CM

## 2020-12-07 DIAGNOSIS — O113 Pre-existing hypertension with pre-eclampsia, third trimester: Secondary | ICD-10-CM | POA: Diagnosis not present

## 2020-12-07 DIAGNOSIS — R1011 Right upper quadrant pain: Secondary | ICD-10-CM

## 2020-12-07 LAB — COMPREHENSIVE METABOLIC PANEL
ALT: 20 U/L (ref 0–44)
AST: 16 U/L (ref 15–41)
Albumin: 3 g/dL — ABNORMAL LOW (ref 3.5–5.0)
Alkaline Phosphatase: 105 U/L (ref 38–126)
Anion gap: 8 (ref 5–15)
BUN: 8 mg/dL (ref 6–20)
CO2: 21 mmol/L — ABNORMAL LOW (ref 22–32)
Calcium: 9.5 mg/dL (ref 8.9–10.3)
Chloride: 105 mmol/L (ref 98–111)
Creatinine, Ser: 0.8 mg/dL (ref 0.44–1.00)
GFR, Estimated: >60 mL/min (ref >60)
Glucose, Bld: 89 mg/dL (ref 70–99)
Potassium: 4.2 mmol/L (ref 3.5–5.1)
Sodium: 134 mmol/L — ABNORMAL LOW (ref 135–145)
Total Bilirubin: 0.5 mg/dL (ref 0.3–1.2)
Total Protein: 6.8 g/dL (ref 6.5–8.1)

## 2020-12-07 LAB — PROTEIN / CREATININE RATIO, URINE
Creatinine, Urine: 212.6 mg/dL
Protein Creatinine Ratio: 0.04 mg/mg{Cre} (ref 0.00–0.15)
Total Protein, Urine: 9 mg/dL

## 2020-12-07 LAB — CBC
HCT: 37 % (ref 36.0–46.0)
Hemoglobin: 11.6 g/dL — ABNORMAL LOW (ref 12.0–15.0)
MCH: 26 pg (ref 26.0–34.0)
MCHC: 31.4 g/dL (ref 30.0–36.0)
MCV: 83 fL (ref 80.0–100.0)
Platelets: 331 10*3/uL (ref 150–400)
RBC: 4.46 MIL/uL (ref 3.87–5.11)
RDW: 14.9 % (ref 11.5–15.5)
WBC: 11.3 10*3/uL — ABNORMAL HIGH (ref 4.0–10.5)
nRBC: 0 % (ref 0.0–0.2)

## 2020-12-07 MED ORDER — LACTATED RINGERS IV SOLN: Freq: Once | INTRAVENOUS | Status: AC

## 2020-12-07 MED ORDER — NIFEDIPINE ER OSMOTIC RELEASE 60 MG PO TB24: 60.0000 mg | ORAL_TABLET | Freq: Every day | ORAL | 2 refills | Status: DC

## 2020-12-07 MED ORDER — PROCHLORPERAZINE EDISYLATE 10 MG/2ML IJ SOLN
10.0000 mg | Freq: Four times a day (QID) | INTRAMUSCULAR | Status: DC | PRN
  Administered 2020-12-07: 10 mg via INTRAVENOUS
  Filled 2020-12-07 (×2): qty 2

## 2020-12-07 MED ORDER — CYCLOBENZAPRINE HCL 5 MG PO TABS: 5.0000 mg | ORAL_TABLET | Freq: Three times a day (TID) | ORAL | 0 refills | Status: DC | PRN

## 2020-12-07 MED ORDER — OXYCODONE-ACETAMINOPHEN 5-325 MG PO TABS
2.0000 | ORAL_TABLET | Freq: Once | ORAL | Status: AC
  Administered 2020-12-07: 2 via ORAL
  Filled 2020-12-07: qty 2

## 2020-12-07 MED ORDER — DIPHENHYDRAMINE HCL 50 MG/ML IJ SOLN
25.0000 mg | Freq: Once | INTRAMUSCULAR | Status: AC
  Administered 2020-12-07: 25 mg via INTRAVENOUS
  Filled 2020-12-07: qty 1

## 2020-12-07 MED ORDER — METOCLOPRAMIDE HCL 5 MG/ML IJ SOLN
10.0000 mg | Freq: Once | INTRAMUSCULAR | Status: AC
  Administered 2020-12-07: 10 mg via INTRAVENOUS
  Filled 2020-12-07: qty 2

## 2020-12-07 MED ORDER — MAGNESIUM SULFATE 2 GM/50ML IV SOLN
2.0000 g | INTRAVENOUS | Status: AC
  Administered 2020-12-07: 2 g via INTRAVENOUS
  Filled 2020-12-07: qty 50

## 2020-12-07 MED ORDER — CYCLOBENZAPRINE HCL 5 MG PO TABS
10.0000 mg | ORAL_TABLET | Freq: Once | ORAL | Status: AC
  Administered 2020-12-07: 10 mg via ORAL
  Filled 2020-12-07: qty 2

## 2020-12-07 NOTE — MAU Note (Signed)
Pt reports she was seen at [facility] today due to an ongoing headache. Pt reports she has had a headache x 24 hours, unrelieved by tylenol. States she has chronic HTN. Also reports pain in her RUQ that radiates to her back.

## 2020-12-07 NOTE — Progress Notes (Signed)
Pt reports fetal movement and complains of ongoing headache since yesterday, unrelieved by Tylenol, BP is elevated today. Pt also reports ongoing constant back and abdominal pain rating 10/10 that has been going on for 2 weeks.

## 2020-12-07 NOTE — Progress Notes (Signed)
   PRENATAL VISIT NOTE  Subjective:  Theresa Patterson is a 25 y.o. G2P0010 at [redacted]w[redacted]d being seen today for ongoing prenatal care.  She is currently monitored for the following issues for this low-risk pregnancy and has Supervision of high-risk pregnancy; Maternal morbid obesity, antepartum (HCC); Chronic hypertension with superimposed preeclampsia; Morbid obesity (HCC); Chronic headaches; Alpha thalassemia silent carrier; and Headache in pregnancy, antepartum, third trimester on their problem list.  Patient reports headache and upper abdomen and back pain .  Contractions: Not present. Vag. Bleeding: None.  Movement: Present. Denies leaking of fluid.   The following portions of the patient's history were reviewed and updated as appropriate: allergies, current medications, past family history, past medical history, past social history, past surgical history and problem list.   Objective:   Vitals:   12/07/20 1402  BP: (!) 150/96  Pulse: (!) 125  Weight: (!) 326 lb (147.9 kg)    Fetal Status: Fetal Heart Rate (bpm): 140 Fundal Height: 34 cm Movement: Present     General:  Alert, oriented and cooperative. Patient is in no acute distress.  Skin: Skin is warm and dry. No rash noted.   Cardiovascular: Normal heart rate noted  Respiratory: Normal respiratory effort, no problems with respiration noted  Abdomen: Soft, gravid, appropriate for gestational age.  Pain/Pressure: Present     Pelvic: Cervical exam deferred        Extremities: Normal range of motion.  Edema: None  Mental Status: Normal mood and affect. Normal behavior. Normal judgment and thought content.   Assessment and Plan:  Pregnancy: G2P0010 at [redacted]w[redacted]d 1. Headache in pregnancy, antepartum, third trimester --Pt with chronic headaches, difficult to assess if this is different. She reports the h/a is occipital and temporal and throbbing.  She has tried PO fluids, Tylenol, and Flexeril without improvement. --To MAU for h/a management and  PEC evaluation  2. Alpha thalassemia silent carrier   3. Supervision of high risk pregnancy in third trimester   4. Chronic hypertension with superimposed preeclampsia --Pt taking Procardia 30 daily.  Visits to MAU with h/a and HTN, normal PEC labs as recently as 12/02/20.  BPs at home 150s to 160s/90s at home every evening, lower in the am.   --BP 150s/90s in office today. Will increase Procardia to 60 daily starting today.  Pt to MAU for evaluation of symptoms but to start new Procardia dose if discharged from MAU.  - NIFEdipine (PROCARDIA XL) 60 MG 24 hr tablet; Take 1 tablet (60 mg total) by mouth daily.  Dispense: 30 tablet; Refill: 2  5. Colicky right upper quadrant pain --Pain in RUQ abdomen and right upper back, constant but waxes and wanes --Started 1 week ago, worsening gradually --To MAU today  6. Uterine size date discrepancy pregnancy, third trimester --FH 34 today, likely related to BMI.  EFW on 11/17/20 was 25 %tile.  Growth [place] next week.    Preterm labor symptoms and general obstetric precautions including but not limited to vaginal bleeding, contractions, leaking of fluid and fetal movement were reviewed in detail with the patient. Please refer to After Visit Summary for other counseling recommendations.   No follow-ups on file.  Future Appointments  Date Time Provider Department Center  12/09/2020 12:15 PM Estelle June, NP PP[facility]  12/14/2020  8:35 AM Warden Fillers, MD [facility] None  12/17/2020 10:45 AM [facility]SE [facility]  12/17/2020 11:00 AM [facility] US1 [facility]    Sharen Counter, CNM

## 2020-12-07 NOTE — MAU Provider Note (Addendum)
History     CSN: [id number]  Arrival date and time: 12/07/20 1503   Event Date/Time   First Provider Initiated Contact with Patient 12/07/20 1552      Chief Complaint  Patient presents with   Headache   Hypertension   HPI Theresa Patterson is a 25 y.o. G2P0010 at [redacted]w[redacted]d who presents to MAU from [facility] for evaluation of recurrent headache in the setting of Chronic Hypertension with Superimposed Preeclampsia, on Procardia 30 XL. Patient endorses history of headaches but states she has had an intermittent headache for the past week with no relief from pain in the past 24 hours. Pain score on arrival to MAU is 8-10/10. She has attempted management with PO Tylenol but has not experienced relief. She denies visual disturbances, SOB, chest pain, palpitations.  Patient also complains of RUQ pain, new onset two weeks ago and worsening over time. Pain radiates to her right upper back. She denies vaginal bleeding, leaking of fluid, decreased fetal movement, fever, falls, or recent illness.    OB History     Gravida  2   Para      Term      Preterm      AB  1   Living         SAB  1   IAB      Ectopic      Multiple      Live Births              Past Medical History:  Diagnosis Date   Alpha thalassemia silent carrier 10/15/2020   Chronic headaches 09/30/2020   Hypertension    Morbid obesity (HCC) 08/20/2020   Polycystic ovary syndrome     Past Surgical History:  Procedure Laterality Date   NO PAST SURGERIES      Family History  Problem Relation Age of Onset   Obesity Mother    Obesity Maternal Grandmother     Social History   Tobacco Use   Smoking status: Never   Smokeless tobacco: Never  Vaping Use   Vaping Use: Never used  Substance Use Topics   Alcohol use: Not Currently    Comment: not since confirmed pregnancy   Drug use: Not Currently    Allergies: No Known Allergies  Medications Prior to Admission  Medication Sig Dispense Refill Last Dose    acetaminophen (TYLENOL) 500 MG tablet Take 1,000 mg by mouth every 6 (six) hours as needed.   12/07/2020 at 1100   aspirin EC 81 MG tablet Take 1 tablet (81 mg total) by mouth daily. 60 tablet 2 12/07/2020   cyclobenzaprine (FLEXERIL) 10 MG tablet Take 1 tablet (10 mg total) by mouth 2 (two) times daily as needed for muscle spasms. 30 tablet 2 12/06/2020   metoCLOPramide (REGLAN) 5 MG tablet Take 1 tablet (5 mg total) by mouth 3 (three) times daily before meals. 90 tablet 1 12/07/2020   NIFEdipine (PROCARDIA XL) 60 MG 24 hr tablet Take 1 tablet (60 mg total) by mouth daily. 30 tablet 2 12/07/2020   omeprazole (PRILOSEC) 40 MG capsule Take 1 capsule (40 mg total) by mouth 2 (two) times daily. 60 capsule 2 12/07/2020   Prenatal Vit-Fe Fumarate-FA (PREPLUS) 27-1 MG TABS Take 1 tablet by mouth daily. 30 tablet 13 12/07/2020   Magnesium 400 MG TABS Take 1 tablet by mouth in the morning and at bedtime. (Patient not taking: Reported on 12/07/2020) 60 tablet 5    pantoprazole (PROTONIX) 40 MG tablet Take 1  tablet (40 mg total) by mouth daily. 30 tablet 1    vitamin B-6 (PYRIDOXINE) 25 MG tablet Take 25 mg by mouth daily. (Patient not taking: Reported on 12/07/2020)       Review of Systems  Eyes:  Negative for visual disturbance.  Gastrointestinal:        RUQ pain radiating to right upper back  Neurological:  Positive for headaches.  All other systems reviewed and are negative. Physical Exam   Blood pressure 138/85, pulse (!) 123, temperature 99.7 F (37.6 C), resp. rate 18, last menstrual period 05/09/2020, SpO2 99 %.  Physical Exam Vitals and nursing note reviewed.  Constitutional:      Appearance: She is obese. She is ill-appearing.  Cardiovascular:     Rate and Rhythm: Normal rate and regular rhythm.     Heart sounds: Normal heart sounds.  Pulmonary:     Effort: Pulmonary effort is normal.     Breath sounds: Normal breath sounds.  Abdominal:     Palpations: Abdomen is soft.     Comments:  Gravid  Skin:    Capillary Refill: Capillary refill takes less than 2 seconds.  Neurological:     Mental Status: She is alert and oriented to person, place, and time.     Sensory: Sensation is intact.     Motor: Motor function is intact.     Coordination: Coordination is intact.  Psychiatric:        Mood and Affect: Mood normal.        Speech: Speech normal.        Behavior: Behavior normal.    MAU Course  Procedures  --S/p [facility] admission for headache and concern for severe features on 09/14.  --Received Magnesium Sulfate and BMZ x 2 during that admission --Procardia 30 XL taken as prescribed this morning. Dose increased to 60 XL during today's office visit --HA pain remains 10/10 s/p IV headache cocktail and 40 min after PO Percocet. Discussed with Dr. Jolayne Panther. Will order PO Flexeril and  Magnesium IVPB per ACOG headache in pregnancy algorithm. --Discussed with [facility] Pharmacist Sue Lush, Magnesium safe to infuse over 60 min. --Workup discussed with Dr. Amada Jupiter, Neurology. No additional medications suggested. Advises imaging via MRI/MRV.  --Confirmed with Dr. Jolayne Panther contrast is not contraindicated for procedure.  Orders Placed This Encounter  Procedures   MR MRV HEAD W WO CONTRAST   Protein / creatinine ratio, urine   CBC   Comprehensive metabolic panel   Blood draw with IV start   Patient Vitals for the past 24 hrs:  BP Temp Pulse Resp SpO2  12/07/20 1746 133/80 -- (!) 103 -- --  12/07/20 1745 -- -- -- -- 100 %  12/07/20 1731 134/82 -- (!) 104 -- --  12/07/20 1730 -- -- -- -- 99 %  12/07/20 1716 125/82 -- (!) 110 -- --  12/07/20 1701 122/79 -- (!) 101 -- --  12/07/20 1646 129/80 -- 98 -- --  12/07/20 1634 123/63 -- (!) 107 -- --  12/07/20 1616 139/83 -- (!) 119 -- --  12/07/20 1546 138/85 -- (!) 123 -- --  12/07/20 1545 -- -- -- -- 99 %  12/07/20 1532 (!) 146/90 99.7 F (37.6 C) (!) 121 18 100 %   Meds ordered this encounter  Medications   lactated ringers  infusion   prochlorperazine (COMPAZINE) injection 10 mg   metoCLOPramide (REGLAN) injection 10 mg   diphenhydrAMINE (BENADRYL) injection 25 mg   oxyCODONE-acetaminophen (PERCOCET/ROXICET) 5-325 MG per tablet 2  tablet   magnesium sulfate IVPB 2 g 50 mL   cyclobenzaprine (FLEXERIL) tablet 10 mg   Report given to M. Mayford Knife, CNM who assumes care of patient at this time.  Clayton Bibles, MSN, CNM Certified Nurse Midwife, [facility] for [employer], [facility]  12/07/20 8:05 PM  MR MRV HEAD WO CM  Result Date: 12/07/2020 CLINICAL DATA:  Pregnant, headache EXAM: MR VENOGRAM HEAD WITHOUT CONTRAST TECHNIQUE: Angiographic images of the intracranial venous structures were acquired using MRV technique without intravenous contrast. COMPARISON:  No pertinent prior exam. FINDINGS: Patient could not tolerate all sequences. Superior sagittal sinus, straight sinus, vein of Galen, and internal cerebral veins are patent. Transverse and sigmoid sinuses are patent. IMPRESSION: No evidence of dural sinus thrombosis. Electronically Signed   By: Guadlupe Spanish M.D.   On: 12/07/2020 19:53    States headache is much better It is now a "5" down from a "10" Per Dr Jolayne Panther, may discharge with rx Flexeril  Assessment and Plan  A:   Single IUP at [redacted]w[redacted]d       Headache, migraine vs tension         Normal preeclampsia labs         History chronic headaches         Chronic hypertension with superimposed preeclampsia  P:    Discharge home        Rx Flexeril for prn use for headaches         Preeclampsia precautions        Followup in office         Encouraged to return if she develops worsening of symptoms, increase in pain, fever, or other concerning symptoms.   Aviva Signs, CNM

## 2020-12-09 ENCOUNTER — Other Ambulatory Visit: Payer: Self-pay

## 2020-12-09 ENCOUNTER — Ambulatory Visit (INDEPENDENT_AMBULATORY_CARE_PROVIDER_SITE_OTHER): Payer: Self-pay | Admitting: Pediatrics

## 2020-12-09 DIAGNOSIS — Z7681 Expectant parent(s) prebirth pediatrician visit: Secondary | ICD-10-CM

## 2020-12-09 NOTE — Progress Notes (Signed)
Prenatal counseling for impending newborn done. Currently [redacted] weeks gestation, will most likely be induced at 37w due to mother with pre-eclampsia. Mom is a silent carrier for alpha-thalassemia. Mother agrees to vaccine policy.   Z76.81

## 2020-12-14 ENCOUNTER — Other Ambulatory Visit (HOSPITAL_COMMUNITY)
Admission: RE | Admit: 2020-12-14 | Discharge: 2020-12-14 | Disposition: A | Discharge status: Home / Self Care | Payer: BC Managed Care – PPO | Source: Ambulatory Visit | Attending: Obstetrics and Gynecology | Admitting: Obstetrics and Gynecology

## 2020-12-14 ENCOUNTER — Other Ambulatory Visit: Payer: Self-pay

## 2020-12-14 ENCOUNTER — Ambulatory Visit (INDEPENDENT_AMBULATORY_CARE_PROVIDER_SITE_OTHER): Payer: BC Managed Care – PPO | Admitting: Obstetrics and Gynecology

## 2020-12-14 VITALS — BP 132/85 | HR 111 | Wt 331.0 lb

## 2020-12-14 DIAGNOSIS — B3731 Acute candidiasis of vulva and vagina: Secondary | ICD-10-CM | POA: Diagnosis not present

## 2020-12-14 DIAGNOSIS — G8929 Other chronic pain: Secondary | ICD-10-CM

## 2020-12-14 DIAGNOSIS — R519 Headache, unspecified: Secondary | ICD-10-CM

## 2020-12-14 DIAGNOSIS — O0993 Supervision of high risk pregnancy, unspecified, third trimester: Secondary | ICD-10-CM

## 2020-12-14 DIAGNOSIS — Z6841 Body Mass Index (BMI) 40.0 and over, adult: Secondary | ICD-10-CM

## 2020-12-14 DIAGNOSIS — O23593 Infection of other part of genital tract in pregnancy, third trimester: Secondary | ICD-10-CM | POA: Diagnosis not present

## 2020-12-14 DIAGNOSIS — O99210 Obesity complicating pregnancy, unspecified trimester: Secondary | ICD-10-CM

## 2020-12-14 DIAGNOSIS — O119 Pre-existing hypertension with pre-eclampsia, unspecified trimester: Secondary | ICD-10-CM

## 2020-12-14 DIAGNOSIS — Z3A31 31 weeks gestation of pregnancy: Secondary | ICD-10-CM | POA: Insufficient documentation

## 2020-12-14 DIAGNOSIS — D563 Thalassemia minor: Secondary | ICD-10-CM

## 2020-12-14 DIAGNOSIS — O26893 Other specified pregnancy related conditions, third trimester: Secondary | ICD-10-CM

## 2020-12-14 DIAGNOSIS — N898 Other specified noninflammatory disorders of vagina: Secondary | ICD-10-CM

## 2020-12-14 NOTE — Progress Notes (Signed)
   PRENATAL VISIT NOTE  Subjective:  Theresa Patterson is a 25 y.o. G2P0010 at [redacted]w[redacted]d being seen today for ongoing prenatal care.  She is currently monitored for the following issues for this high-risk pregnancy and has Supervision of high-risk pregnancy; Maternal morbid obesity, antepartum (HCC); Chronic hypertension with superimposed preeclampsia; Morbid obesity (HCC); Chronic headaches; Alpha thalassemia silent carrier; Headache in pregnancy, antepartum, third trimester; [redacted] weeks gestation of pregnancy; BMI 45.0-49.9, adult (HCC); and Vaginal discharge during pregnancy in third trimester on their problem list.  Patient doing well with no acute concerns today. She reports  occasional sharp vaginal pain .  Contractions: Not present. Vag. Bleeding: None.  Movement: Present. Denies leaking of fluid.   The following portions of the patient's history were reviewed and updated as appropriate: allergies, current medications, past family history, past medical history, past social history, past surgical history and problem list. Problem list updated.  Objective:   Vitals:   12/14/20 0842  BP: 132/85  Pulse: (!) 111  Weight: (!) 331 lb (150.1 kg)    Fetal Status: Fetal Heart Rate (bpm): 144   Movement: Present     General:  Alert, oriented and cooperative. Patient is in no acute distress.  Skin: Skin is warm and dry. No rash noted.   Cardiovascular: Normal heart rate noted  Respiratory: Normal respiratory effort, no problems with respiration noted  Abdomen: Soft, gravid, appropriate for gestational age.  Pain/Pressure: Present     Pelvic: Cervical exam performed        Extremities: Normal range of motion.  Edema: None  Mental Status:  Normal mood and affect. Normal behavior. Normal judgment and thought content.  SSE: small amount of thick creamy white d/c, cervix visually closed  Assessment and Plan:  Pregnancy: G2P0010 at [redacted]w[redacted]d  1. Supervision of high risk pregnancy in third  trimester Continue routine care Pt reassured regarding vaginal pain, ? Source round ligament No s/sx of PTL 2. [redacted] weeks gestation of pregnancy   3. Chronic hypertension with superimposed preeclampsia BP well controlled today  4. Maternal morbid obesity, antepartum (HCC)   5. Chronic nonintractable headache, unspecified headache type No reports of HA today  6. Alpha thalassemia silent carrier   7. BMI 45.0-49.9, adult (HCC)   8. Vaginal discharge during pregnancy in third trimester Swab taken likely candida - Cervicovaginal ancillary only  Preterm labor symptoms and general obstetric precautions including but not limited to vaginal bleeding, contractions, leaking of fluid and fetal movement were reviewed in detail with the patient.  Please refer to After Visit Summary for other counseling recommendations.   Return in about 2 weeks (around 12/28/2020) for [facility], in person.   Mariel Aloe, MD Faculty Attending Center for [facility]

## 2020-12-14 NOTE — Progress Notes (Signed)
ROB [redacted]w[redacted]d  Recent MAU visit on 12/07/20.  CC: Sharp pain in vaginal area x 2 wks now pt states however pain is more frequent now.  Pt states HA's have gotten better. Pt is now taking Procardia 60mg 

## 2020-12-15 LAB — CERVICOVAGINAL ANCILLARY ONLY
Bacterial Vaginitis (gardnerella): NEGATIVE
Candida Glabrata: NEGATIVE
Candida Vaginitis: POSITIVE — AB
Comment: NEGATIVE
Comment: NEGATIVE
Comment: NEGATIVE
Comment: NEGATIVE
Trichomonas: NEGATIVE

## 2020-12-16 ENCOUNTER — Telehealth: Payer: Self-pay

## 2020-12-16 NOTE — Telephone Encounter (Signed)
Call patient to inform her of test results and offer treatment. Patient stated she will get monistat OTC.

## 2020-12-16 NOTE — Telephone Encounter (Signed)
-----   Message from Warden Fillers, MD sent at 12/16/2020  2:20 PM EDT ----- Yeast infection noted, will offer treatment

## 2020-12-17 ENCOUNTER — Ambulatory Visit: Payer: BC Managed Care – PPO | Admitting: *Deleted

## 2020-12-17 ENCOUNTER — Ambulatory Visit: Payer: BC Managed Care – PPO | Attending: Maternal & Fetal Medicine

## 2020-12-17 ENCOUNTER — Ambulatory Visit: Payer: BC Managed Care – PPO | Attending: Obstetrics | Admitting: Obstetrics

## 2020-12-17 ENCOUNTER — Other Ambulatory Visit: Payer: Self-pay | Admitting: Maternal & Fetal Medicine

## 2020-12-17 ENCOUNTER — Other Ambulatory Visit: Payer: Self-pay | Admitting: Obstetrics

## 2020-12-17 ENCOUNTER — Other Ambulatory Visit: Payer: Self-pay

## 2020-12-17 VITALS — BP 143/81 | HR 113

## 2020-12-17 DIAGNOSIS — Z3A31 31 weeks gestation of pregnancy: Secondary | ICD-10-CM

## 2020-12-17 DIAGNOSIS — R519 Headache, unspecified: Secondary | ICD-10-CM

## 2020-12-17 DIAGNOSIS — O10013 Pre-existing essential hypertension complicating pregnancy, third trimester: Secondary | ICD-10-CM | POA: Diagnosis not present

## 2020-12-17 DIAGNOSIS — O26893 Other specified pregnancy related conditions, third trimester: Secondary | ICD-10-CM | POA: Diagnosis present

## 2020-12-17 DIAGNOSIS — E669 Obesity, unspecified: Secondary | ICD-10-CM

## 2020-12-17 DIAGNOSIS — O99213 Obesity complicating pregnancy, third trimester: Secondary | ICD-10-CM

## 2020-12-17 DIAGNOSIS — O119 Pre-existing hypertension with pre-eclampsia, unspecified trimester: Secondary | ICD-10-CM | POA: Insufficient documentation

## 2020-12-17 DIAGNOSIS — O0993 Supervision of high risk pregnancy, unspecified, third trimester: Secondary | ICD-10-CM | POA: Diagnosis present

## 2020-12-17 DIAGNOSIS — Z362 Encounter for other antenatal screening follow-up: Secondary | ICD-10-CM | POA: Diagnosis not present

## 2020-12-17 DIAGNOSIS — D563 Thalassemia minor: Secondary | ICD-10-CM | POA: Diagnosis present

## 2020-12-17 DIAGNOSIS — O10912 Unspecified pre-existing hypertension complicating pregnancy, second trimester: Secondary | ICD-10-CM | POA: Insufficient documentation

## 2020-12-17 DIAGNOSIS — O10913 Unspecified pre-existing hypertension complicating pregnancy, third trimester: Secondary | ICD-10-CM

## 2020-12-17 NOTE — Progress Notes (Signed)
MFM Note  Theresa Patterson was seen for a follow up growth scan due to chronic hypertension treated with nifedipine 60 mg daily and maternal obesity with a BMI of 48.  The patient reports that she was hospitalized about 3 weeks ago due to superimposed preeclampsia.  Her P/C ratio was 3.41.  Her PIH labs were within normal limits at that time.  Her blood pressure today was 143/81.  Due to the diagnosis of chronic hypertension with superimposed preeclampsia, she received a course of antenatal corticosteroids during that admission.  She was informed that the fetal growth and amniotic fluid level appears appropriate for her gestational age. The overall EFW measured 4 pounds 9 ounces (75th percentile).  A biophysical profile performed today was 8 out of 8.  Due to chronic hypertension with superimposed preeclampsia, we will see her for twice weekly fetal testing (NSTs to alternate with BPP's).  The patient was advised that the goal for her delivery would be 37 weeks.    However, delivery prior to 37 weeks may be necessary should she develop any signs or symptoms of severe preeclampsia such as headaches, for nonreassuring fetal status, or should her blood pressures be persistently elevated above 150 over high 90s despite treatment.  Preeclampsia precautions were reviewed today.    She will return next week for twice-weekly testing.  A total of 20 minutes was spent counseling and coordinating the care for this patient.  Greater than 50% of the time was spent in direct face-to-face contact.

## 2020-12-20 ENCOUNTER — Ambulatory Visit: Payer: BC Managed Care – PPO | Admitting: *Deleted

## 2020-12-20 ENCOUNTER — Other Ambulatory Visit: Payer: Self-pay | Admitting: *Deleted

## 2020-12-20 ENCOUNTER — Ambulatory Visit: Payer: BC Managed Care – PPO | Attending: Obstetrics | Admitting: *Deleted

## 2020-12-20 ENCOUNTER — Other Ambulatory Visit: Payer: Self-pay

## 2020-12-20 VITALS — BP 119/66 | HR 106

## 2020-12-20 DIAGNOSIS — O0993 Supervision of high risk pregnancy, unspecified, third trimester: Secondary | ICD-10-CM

## 2020-12-20 DIAGNOSIS — O119 Pre-existing hypertension with pre-eclampsia, unspecified trimester: Secondary | ICD-10-CM

## 2020-12-20 DIAGNOSIS — R519 Headache, unspecified: Secondary | ICD-10-CM

## 2020-12-20 DIAGNOSIS — O10919 Unspecified pre-existing hypertension complicating pregnancy, unspecified trimester: Secondary | ICD-10-CM

## 2020-12-20 DIAGNOSIS — O26893 Other specified pregnancy related conditions, third trimester: Secondary | ICD-10-CM

## 2020-12-20 DIAGNOSIS — Z3A32 32 weeks gestation of pregnancy: Secondary | ICD-10-CM | POA: Diagnosis not present

## 2020-12-20 DIAGNOSIS — D563 Thalassemia minor: Secondary | ICD-10-CM

## 2020-12-20 NOTE — Procedures (Signed)
Theresa Patterson 17-Jun-1995 [redacted]w[redacted]d  Fetus A Non-Stress Test Interpretation for 12/20/20  Indication: Chronic Hypertenstion  Fetal Heart Rate A Mode: External Baseline Rate (A): 140 bpm Variability: Moderate Accelerations: 15 x 15 Decelerations: None Multiple birth?: No  Uterine Activity Mode: Palpation, Toco Contraction Frequency (min): None Resting Tone Palpated: Relaxed Resting Time: Adequate  Interpretation (Fetal Testing) Nonstress Test Interpretation: Reactive Comments: Dr. Parke Poisson reviewed tracing.

## 2020-12-21 ENCOUNTER — Ambulatory Visit: Payer: BC Managed Care – PPO

## 2020-12-21 ENCOUNTER — Other Ambulatory Visit: Payer: BC Managed Care – PPO

## 2020-12-23 ENCOUNTER — Other Ambulatory Visit: Payer: Self-pay

## 2020-12-23 ENCOUNTER — Encounter: Payer: Self-pay | Admitting: *Deleted

## 2020-12-23 ENCOUNTER — Ambulatory Visit: Payer: BC Managed Care – PPO | Admitting: *Deleted

## 2020-12-23 ENCOUNTER — Ambulatory Visit: Payer: BC Managed Care – PPO | Attending: Obstetrics

## 2020-12-23 VITALS — BP 122/76 | HR 114

## 2020-12-23 DIAGNOSIS — O10013 Pre-existing essential hypertension complicating pregnancy, third trimester: Secondary | ICD-10-CM | POA: Diagnosis not present

## 2020-12-23 DIAGNOSIS — Z362 Encounter for other antenatal screening follow-up: Secondary | ICD-10-CM

## 2020-12-23 DIAGNOSIS — O0993 Supervision of high risk pregnancy, unspecified, third trimester: Secondary | ICD-10-CM | POA: Diagnosis present

## 2020-12-23 DIAGNOSIS — O10913 Unspecified pre-existing hypertension complicating pregnancy, third trimester: Secondary | ICD-10-CM | POA: Diagnosis present

## 2020-12-23 DIAGNOSIS — O119 Pre-existing hypertension with pre-eclampsia, unspecified trimester: Secondary | ICD-10-CM | POA: Insufficient documentation

## 2020-12-23 DIAGNOSIS — O99213 Obesity complicating pregnancy, third trimester: Secondary | ICD-10-CM | POA: Diagnosis not present

## 2020-12-23 DIAGNOSIS — D563 Thalassemia minor: Secondary | ICD-10-CM | POA: Insufficient documentation

## 2020-12-23 DIAGNOSIS — R519 Headache, unspecified: Secondary | ICD-10-CM | POA: Insufficient documentation

## 2020-12-23 DIAGNOSIS — Z3A32 32 weeks gestation of pregnancy: Secondary | ICD-10-CM

## 2020-12-23 DIAGNOSIS — O26893 Other specified pregnancy related conditions, third trimester: Secondary | ICD-10-CM | POA: Insufficient documentation

## 2020-12-26 ENCOUNTER — Inpatient Hospital Stay (HOSPITAL_COMMUNITY)
Admission: AD | Admit: 2020-12-26 | Discharge: 2020-12-27 | Disposition: A | Discharge status: Home / Self Care | Payer: BC Managed Care – PPO | Source: Ambulatory Visit | Attending: Obstetrics & Gynecology | Admitting: Obstetrics & Gynecology

## 2020-12-26 ENCOUNTER — Other Ambulatory Visit: Payer: Self-pay

## 2020-12-26 ENCOUNTER — Inpatient Hospital Stay (HOSPITAL_COMMUNITY): Payer: BC Managed Care – PPO

## 2020-12-26 ENCOUNTER — Encounter (HOSPITAL_COMMUNITY): Payer: Self-pay | Admitting: Obstetrics & Gynecology

## 2020-12-26 DIAGNOSIS — O119 Pre-existing hypertension with pre-eclampsia, unspecified trimester: Secondary | ICD-10-CM

## 2020-12-26 DIAGNOSIS — O10013 Pre-existing essential hypertension complicating pregnancy, third trimester: Secondary | ICD-10-CM | POA: Diagnosis not present

## 2020-12-26 DIAGNOSIS — O10913 Unspecified pre-existing hypertension complicating pregnancy, third trimester: Secondary | ICD-10-CM | POA: Insufficient documentation

## 2020-12-26 DIAGNOSIS — O0993 Supervision of high risk pregnancy, unspecified, third trimester: Secondary | ICD-10-CM

## 2020-12-26 DIAGNOSIS — O10919 Unspecified pre-existing hypertension complicating pregnancy, unspecified trimester: Secondary | ICD-10-CM

## 2020-12-26 DIAGNOSIS — O99613 Diseases of the digestive system complicating pregnancy, third trimester: Secondary | ICD-10-CM

## 2020-12-26 DIAGNOSIS — O26613 Liver and biliary tract disorders in pregnancy, third trimester: Secondary | ICD-10-CM | POA: Insufficient documentation

## 2020-12-26 DIAGNOSIS — O26893 Other specified pregnancy related conditions, third trimester: Secondary | ICD-10-CM | POA: Insufficient documentation

## 2020-12-26 DIAGNOSIS — R1010 Upper abdominal pain, unspecified: Secondary | ICD-10-CM

## 2020-12-26 DIAGNOSIS — K8020 Calculus of gallbladder without cholecystitis without obstruction: Secondary | ICD-10-CM | POA: Insufficient documentation

## 2020-12-26 DIAGNOSIS — R1013 Epigastric pain: Secondary | ICD-10-CM | POA: Insufficient documentation

## 2020-12-26 DIAGNOSIS — Z3689 Encounter for other specified antenatal screening: Secondary | ICD-10-CM

## 2020-12-26 DIAGNOSIS — Z3A33 33 weeks gestation of pregnancy: Secondary | ICD-10-CM | POA: Diagnosis not present

## 2020-12-26 DIAGNOSIS — R519 Headache, unspecified: Secondary | ICD-10-CM

## 2020-12-26 DIAGNOSIS — D563 Thalassemia minor: Secondary | ICD-10-CM

## 2020-12-26 LAB — COMPREHENSIVE METABOLIC PANEL
ALT: 28 U/L (ref 0–44)
AST: 21 U/L (ref 15–41)
Albumin: 2.8 g/dL — ABNORMAL LOW (ref 3.5–5.0)
Alkaline Phosphatase: 112 U/L (ref 38–126)
Anion gap: 7 (ref 5–15)
BUN: 7 mg/dL (ref 6–20)
CO2: 18 mmol/L — ABNORMAL LOW (ref 22–32)
Calcium: 9.2 mg/dL (ref 8.9–10.3)
Chloride: 108 mmol/L (ref 98–111)
Creatinine, Ser: 0.67 mg/dL (ref 0.44–1.00)
GFR, Estimated: >60 mL/min (ref >60)
Glucose, Bld: 92 mg/dL (ref 70–99)
Potassium: 3.6 mmol/L (ref 3.5–5.1)
Sodium: 133 mmol/L — ABNORMAL LOW (ref 135–145)
Total Bilirubin: 0.2 mg/dL — ABNORMAL LOW (ref 0.3–1.2)
Total Protein: 6.2 g/dL — ABNORMAL LOW (ref 6.5–8.1)

## 2020-12-26 LAB — URINALYSIS, ROUTINE W REFLEX MICROSCOPIC
Bacteria, UA: NONE SEEN
Bilirubin Urine: NEGATIVE
Glucose, UA: NEGATIVE mg/dL
Hgb urine dipstick: NEGATIVE
Ketones, ur: NEGATIVE mg/dL
Nitrite: NEGATIVE
Protein, ur: NEGATIVE mg/dL
Specific Gravity, Urine: 1.018 (ref 1.005–1.030)
pH: 6 (ref 5.0–8.0)

## 2020-12-26 LAB — CBC
HCT: 33.5 % — ABNORMAL LOW (ref 36.0–46.0)
Hemoglobin: 10.8 g/dL — ABNORMAL LOW (ref 12.0–15.0)
MCH: 26.3 pg (ref 26.0–34.0)
MCHC: 32.2 g/dL (ref 30.0–36.0)
MCV: 81.5 fL (ref 80.0–100.0)
Platelets: 303 10*3/uL (ref 150–400)
RBC: 4.11 MIL/uL (ref 3.87–5.11)
RDW: 14.5 % (ref 11.5–15.5)
WBC: 9.3 10*3/uL (ref 4.0–10.5)
nRBC: 0 % (ref 0.0–0.2)

## 2020-12-26 LAB — PROTEIN / CREATININE RATIO, URINE
Creatinine, Urine: 161.03 mg/dL
Protein Creatinine Ratio: 0.08 mg/mg{Cre} (ref 0.00–0.15)
Total Protein, Urine: 13 mg/dL

## 2020-12-26 LAB — LIPASE, BLOOD: Lipase: 40 U/L (ref 11–51)

## 2020-12-26 NOTE — MAU Provider Note (Signed)
History     CSN: [id number]  Arrival date and time: 12/26/20 2149   Event Date/Time   First Provider Initiated Contact with Patient 12/26/20 2228      Chief Complaint  Patient presents with   Hypertension   25 y.o. G2P0010 @33 .0 wks with [facility] presenting for elevated BP. Pt reports being prompted by baby Rx to check BP and was 138/93, and she was advised to come to MAU. She took her Procardia today. Denies HA, visual disturbances, SOB, and CP. She does endorse upper abdominal pain and back pain for the last 2-3 weeks. Abdominal pain is in the epigastric region and intermittent. Pain is sometimes relieved by BM. Pain is worsened after greasy foods at times. Reports several loose BMs today, no diarrhea. Had emesis over the last few days, mostly once in the am, but none today. Appetite has been decreased over the last few days. Denies pregnancy complaints. No VB, LOF, ctx. +FM. Also reports feeling anxious and depressed since she started taking Protonix and Mg Oxide about 3 weeks ago. She stopped the Mg a few days ago. Denies SI/HI. She is worried about having PPD.    OB History     Gravida  2   Para      Term      Preterm      AB  1   Living         SAB  1   IAB      Ectopic      Multiple      Live Births              Past Medical History:  Diagnosis Date   Alpha thalassemia silent carrier 10/15/2020   Chronic headaches 09/30/2020   Hypertension    Morbid obesity (HCC) 08/20/2020   Polycystic ovary syndrome     Past Surgical History:  Procedure Laterality Date   NO PAST SURGERIES      Family History  Problem Relation Age of Onset   Obesity Mother    Obesity Maternal Grandmother     Social History   Tobacco Use   Smoking status: Never   Smokeless tobacco: Never  Vaping Use   Vaping Use: Never used  Substance Use Topics   Alcohol use: Not Currently    Comment: not since confirmed pregnancy   Drug use: Not Currently    Allergies: No Known  Allergies  No medications prior to admission.    Review of Systems  Constitutional:  Negative for fever.  Eyes:  Negative for visual disturbance.  Respiratory:  Negative for shortness of breath.   Cardiovascular:  Negative for chest pain.  Gastrointestinal:  Positive for abdominal pain, nausea and vomiting. Negative for diarrhea.  Genitourinary:  Negative for dysuria, frequency, hematuria, urgency, vaginal bleeding and vaginal discharge.  Neurological:  Negative for headaches.  Physical Exam   Blood pressure 138/76, pulse (!) 106, temperature 98.8 F (37.1 C), temperature source Oral, resp. rate 18, height 5\' 8"  (1.727 m), weight (!) 150.5 kg, last menstrual period 05/09/2020, SpO2 100 %. Patient Vitals for the past 24 hrs:  BP Temp Temp src Pulse Resp SpO2 Height Weight  12/27/20 0037 138/76 -- Oral (!) 106 18 100 % -- --  12/26/20 2331 118/64 -- -- (!) 103 -- -- -- --  12/26/20 2315 140/80 -- -- (!) 105 -- 97 % -- --  12/26/20 2300 138/78 -- -- (!) 104 -- 98 % -- --  12/26/20 2245 138/78 -- -- 12/28/20  102 -- 99 % -- --  12/26/20 2225 132/75 -- -- (!) 113 -- 97 % -- --  12/26/20 2205 130/79 98.8 F (37.1 C) Oral (!) 103 18 99 % 5\' 8"  (1.727 m) (!) 150.5 kg    Physical Exam Vitals and nursing note reviewed.  Constitutional:      General: She is not in acute distress.    Appearance: Normal appearance.  HENT:     Head: Normocephalic and atraumatic.  Cardiovascular:     Rate and Rhythm: Normal rate.  Pulmonary:     Effort: Pulmonary effort is normal. No respiratory distress.  Abdominal:     General: There is no distension.     Palpations: Abdomen is soft.     Tenderness: There is no abdominal tenderness. There is no guarding.     Comments: gravid  Musculoskeletal:        General: Normal range of motion.     Cervical back: Normal range of motion.  Skin:    General: Skin is warm and dry.  Neurological:     General: No focal deficit present.     Mental Status: She is alert  and oriented to person, place, and time.  Psychiatric:        Mood and Affect: Mood normal.        Behavior: Behavior normal.  EFM: 135 bpm, mod variability, + accels, no decels Toco: UI  Results for orders placed or performed during the hospital encounter of 12/26/20 (from the past 24 hour(s))  Urinalysis, Routine w reflex microscopic     Status: Abnormal   Collection Time: 12/26/20 10:12 PM  Result Value Ref Range   Color, Urine YELLOW YELLOW   APPearance HAZY (A) CLEAR   Specific Gravity, Urine 1.018 1.005 - 1.030   pH 6.0 5.0 - 8.0   Glucose, UA NEGATIVE NEGATIVE mg/dL   Hgb urine dipstick NEGATIVE NEGATIVE   Bilirubin Urine NEGATIVE NEGATIVE   Ketones, ur NEGATIVE NEGATIVE mg/dL   Protein, ur NEGATIVE NEGATIVE mg/dL   Nitrite NEGATIVE NEGATIVE   Leukocytes,Ua SMALL (A) NEGATIVE   RBC / HPF 0-5 0 - 5 RBC/hpf   WBC, UA 6-10 0 - 5 WBC/hpf   Bacteria, UA NONE SEEN NONE SEEN   Squamous Epithelial / LPF 6-10 0 - 5   Mucus PRESENT   Protein / creatinine ratio, urine     Status: None   Collection Time: 12/26/20 10:12 PM  Result Value Ref Range   Creatinine, Urine 161.03 mg/dL   Total Protein, Urine 13 mg/dL   Protein Creatinine Ratio 0.08 0.00 - 0.15 mg/mg[Cre]  CBC     Status: Abnormal   Collection Time: 12/26/20 10:46 PM  Result Value Ref Range   WBC 9.3 4.0 - 10.5 K/uL   RBC 4.11 3.87 - 5.11 MIL/uL   Hemoglobin 10.8 (L) 12.0 - 15.0 g/dL   HCT 12/28/20 (L) 92.4 - 26.8 %   MCV 81.5 80.0 - 100.0 fL   MCH 26.3 26.0 - 34.0 pg   MCHC 32.2 30.0 - 36.0 g/dL   RDW 34.1 96.2 - 22.9 %   Platelets 303 150 - 400 K/uL   nRBC 0.0 0.0 - 0.2 %  Comprehensive metabolic panel     Status: Abnormal   Collection Time: 12/26/20 10:46 PM  Result Value Ref Range   Sodium 133 (L) 135 - 145 mmol/L   Potassium 3.6 3.5 - 5.1 mmol/L   Chloride 108 98 - 111 mmol/L   CO2 18 (  L) 22 - 32 mmol/L   Glucose, Bld 92 70 - 99 mg/dL   BUN 7 6 - 20 mg/dL   Creatinine, Ser 4.09 0.44 - 1.00 mg/dL   Calcium  9.2 8.9 - 81.1 mg/dL   Total Protein 6.2 (L) 6.5 - 8.1 g/dL   Albumin 2.8 (L) 3.5 - 5.0 g/dL   AST 21 15 - 41 U/L   ALT 28 0 - 44 U/L   Alkaline Phosphatase 112 38 - 126 U/L   Total Bilirubin 0.2 (L) 0.3 - 1.2 mg/dL   GFR, Estimated >91 >47 mL/min   Anion gap 7 5 - 15  Lipase, blood     Status: None   Collection Time: 12/26/20 10:46 PM  Result Value Ref Range   Lipase 40 11 - 51 U/L    US ABDOMEN LIMITED RUQ (LIVER/GB)  Result Date: 12/27/2020 CLINICAL DATA:  Right upper quadrant abdominal pain, nausea/vomiting EXAM: ULTRASOUND ABDOMEN LIMITED RIGHT UPPER QUADRANT COMPARISON:  None. FINDINGS: Gallbladder: Multiple small gallstones measuring up to 10 mm. No gallbladder wall thickening or pericholecystic fluid. Negative sonographic Murphy's sign. Common bile duct: Diameter: 2 mm Liver: No focal lesion identified. Within normal limits in parenchymal echogenicity. Portal vein is patent on color Doppler imaging with normal direction of blood flow towards the liver. Other: None. IMPRESSION: Cholelithiasis, without associated sonographic findings to suggest acute cholecystitis. Electronically Signed   By: Charline Bills M.D.   On: 12/27/2020 00:01    MAU Course  Procedures  MDM Labs and [place] ordered and reviewed. BP stable, dx with si PEC 1 month ago, no SF. Gallstones seen on [place], likely source of abd pain. Discussed f/u with GS and low fat diet. Rx Phenergan prn. Encouraged to discuss feelings with OB provider at upcoming appt, consider referral to [facility]. Stable for discharge home.   Assessment and Plan  [redacted] weeks gestation NST reactive CHTN Cholelithiasis  Discharge home Follow up at [facility] as scheduled Follow up with General Surgery for consult- referral placed Return precautions Rx Phenergan  Allergies as of 12/27/2020   No Known Allergies      Medication List     STOP taking these medications    cyclobenzaprine 10 MG tablet Commonly known as: FLEXERIL   cyclobenzaprine  5 MG tablet Commonly known as: FLEXERIL   Magnesium 400 MG Tabs   metoCLOPramide 5 MG tablet Commonly known as: Reglan   omeprazole 40 MG capsule Commonly known as: PRILOSEC   pantoprazole 40 MG tablet Commonly known as: Protonix   vitamin B-6 25 MG tablet Commonly known as: pyridOXINE       TAKE these medications    acetaminophen 500 MG tablet Commonly known as: TYLENOL Take 1,000 mg by mouth every 6 (six) hours as needed.   aspirin EC 81 MG tablet Take 1 tablet (81 mg total) by mouth daily.   NIFEdipine 60 MG 24 hr tablet Commonly known as: Procardia XL Take 1 tablet (60 mg total) by mouth daily.   PrePLUS 27-1 MG Tabs Take 1 tablet by mouth daily.   promethazine 25 MG tablet Commonly known as: PHENERGAN Take 0.5-1 tablets (12.5-25 mg total) by mouth every 6 (six) hours as needed for nausea or vomiting.       Donette Larry, CNM 12/27/2020, 1:16 AM

## 2020-12-26 NOTE — MAU Note (Signed)
Pt reports to MAU with c/o elevated pressure.  Pt states that she put in BP reading 138/93 into Babyscripts and was advised by on call nurse to come to MAU for evaluation.

## 2020-12-27 ENCOUNTER — Ambulatory Visit: Payer: BC Managed Care – PPO

## 2020-12-27 ENCOUNTER — Other Ambulatory Visit: Payer: BC Managed Care – PPO

## 2020-12-27 MED ORDER — PROMETHAZINE HCL 25 MG PO TABS: 12.5000 mg | ORAL_TABLET | Freq: Four times a day (QID) | ORAL | 0 refills | Status: DC | PRN

## 2020-12-27 NOTE — MAU Note (Signed)
Pt d/c'd from [facility] per provider order.

## 2020-12-28 ENCOUNTER — Other Ambulatory Visit: Payer: Self-pay

## 2020-12-28 ENCOUNTER — Ambulatory Visit: Payer: BC Managed Care – PPO | Attending: Obstetrics | Admitting: *Deleted

## 2020-12-28 ENCOUNTER — Ambulatory Visit (HOSPITAL_BASED_OUTPATIENT_CLINIC_OR_DEPARTMENT_OTHER): Payer: BC Managed Care – PPO | Admitting: *Deleted

## 2020-12-28 ENCOUNTER — Encounter: Payer: Self-pay | Admitting: *Deleted

## 2020-12-28 ENCOUNTER — Ambulatory Visit (INDEPENDENT_AMBULATORY_CARE_PROVIDER_SITE_OTHER): Payer: BC Managed Care – PPO | Admitting: Obstetrics & Gynecology

## 2020-12-28 ENCOUNTER — Ambulatory Visit (INDEPENDENT_AMBULATORY_CARE_PROVIDER_SITE_OTHER): Payer: BC Managed Care – PPO | Admitting: Licensed Clinical Social Worker

## 2020-12-28 VITALS — BP 134/72 | HR 98

## 2020-12-28 DIAGNOSIS — O99340 Other mental disorders complicating pregnancy, unspecified trimester: Secondary | ICD-10-CM | POA: Diagnosis not present

## 2020-12-28 DIAGNOSIS — O10913 Unspecified pre-existing hypertension complicating pregnancy, third trimester: Secondary | ICD-10-CM | POA: Insufficient documentation

## 2020-12-28 DIAGNOSIS — F419 Anxiety disorder, unspecified: Secondary | ICD-10-CM

## 2020-12-28 DIAGNOSIS — O0993 Supervision of high risk pregnancy, unspecified, third trimester: Secondary | ICD-10-CM

## 2020-12-28 DIAGNOSIS — R519 Headache, unspecified: Secondary | ICD-10-CM

## 2020-12-28 DIAGNOSIS — D563 Thalassemia minor: Secondary | ICD-10-CM

## 2020-12-28 DIAGNOSIS — Z3A33 33 weeks gestation of pregnancy: Secondary | ICD-10-CM | POA: Insufficient documentation

## 2020-12-28 DIAGNOSIS — O119 Pre-existing hypertension with pre-eclampsia, unspecified trimester: Secondary | ICD-10-CM

## 2020-12-28 DIAGNOSIS — O26893 Other specified pregnancy related conditions, third trimester: Secondary | ICD-10-CM

## 2020-12-28 MED ORDER — NIFEDIPINE ER 90 MG PO TB24: 90.0000 mg | ORAL_TABLET | Freq: Every day | ORAL | 1 refills | Status: DC

## 2020-12-28 NOTE — BH Specialist Note (Addendum)
Integrated Behavioral Health Initial In-Person Visit  MRN: [id number] Name: Theresa Patterson  Number of Integrated Behavioral Health Clinician visits:: 1/6 Session Start time: 9:03am  Session End time: 9:21am Total time: 18 minutes in person   Types of Service: General Behavioral Integrated Care (BHI)  Interpretor:No. Interpretor Name and Language: None    Warm Hand Off Completed.        Subjective: Theresa Patterson is a 25 y.o. female accompanied by n/a Patient was referred by Theresa Bal MD for anxiety. Patient reports the following symptoms/concerns: anxiety Duration of problem: approx two weeks ; Severity of problem: mild  Objective: Mood: Anxious and Affect: Appropriate Risk of harm to self or others: No plan to harm self or others  Life Context: Family and Social: Resides in [place] extended family resides in [place]  School/Work: n/a Self-Care: n/a Life Changes: New pregnancy  Patient and/or Family's Strengths/Protective Factors: Concrete supports in place (healthy food, safe environments, etc.)  Goals Addressed: Patient will: Reduce symptoms of: anxiety Increase knowledge and/or ability of: coping skills  Demonstrate ability to: Increase healthy adjustment to current life circumstances  Progress towards Goals: Ongoing  Interventions: Interventions utilized: Supportive Counseling  Standardized Assessments completed: PHQ 9  Patient and/or Family Response: Theresa Patterson reports increased anxiety regarding labor and becoming a mom. Theresa Patterson reports decreased motivation and increased irritability.   Theresa Patterson reports     Assessment: Patient currently experiencing anxiety affecting pregnancy.   Patient may benefit from integrated behavioral health .  Plan: Follow up with behavioral health clinician on : 01/04/2021 Behavioral recommendations: Prioritize task in manner of importance to improve mood, prioritize rest and engage in self care to boost mood   Referral(s): Integrated [employer] (In Clinic) "From scale of 1-10, how likely are you to follow plan?":   Theresa Saxon, LCSW

## 2020-12-28 NOTE — Progress Notes (Signed)
Pt would like depression, states very recent quick change in mental health.  Pt states she has a history of anxiety and depression, has been on medication in past.    PHQ9 score 17 today.  GAD score 19 today.

## 2020-12-28 NOTE — Progress Notes (Signed)
   PRENATAL VISIT NOTE  Subjective:  Theresa Patterson is a 25 y.o. G2P0010 at [redacted]w[redacted]d being seen today for ongoing prenatal care.  She is currently monitored for the following issues for this high-risk pregnancy and has Supervision of high-risk pregnancy; Maternal morbid obesity, antepartum (HCC); Chronic hypertension with superimposed preeclampsia; Morbid obesity (HCC); Chronic headaches; Alpha thalassemia silent carrier; Headache in pregnancy, antepartum, third trimester; BMI 45.0-49.9, adult (HCC); and Vaginal discharge during pregnancy in third trimester on their problem list.  Patient reports heartburn and nausea.  Contractions: Not present. Vag. Bleeding: None.  Movement: Present. Denies leaking of fluid.  Depression and anxiety recently The following portions of the patient's history were reviewed and updated as appropriate: allergies, current medications, past family history, past medical history, past social history, past surgical history and problem list.   Objective:   Vitals:   12/28/20 0837  BP: (!) 143/84  Pulse: (!) 112  Weight: (!) 329 lb (149.2 kg)    Fetal Status: Fetal Heart Rate (bpm): 140   Movement: Present     General:  Alert, oriented and cooperative. Patient is in no acute distress.  Skin: Skin is warm and dry. No rash noted.   Cardiovascular: Normal heart rate noted  Respiratory: Normal respiratory effort, no problems with respiration noted  Abdomen: Soft, gravid, appropriate for gestational age.  Pain/Pressure: Absent     Pelvic: Cervical exam deferred        Extremities: Normal range of motion.     Mental Status: Normal mood and affect. Normal behavior. Normal judgment and thought content.   Assessment and Plan:  Pregnancy: G2P0010 at [redacted]w[redacted]d 1. Morbid obesity (HCC) Body mass index is 50.02 kg/m.   2. Supervision of high risk pregnancy in third trimester Depression score elevated and feels depressed and anxious - Ambulatory referral to Integrated  Behavioral Health  3. Chronic hypertension with superimposed preeclampsia Need to increase for better control, seen in MAU 2 days ago - NIFEdipine (ADALAT CC) 90 MG 24 hr tablet; Take 1 tablet (90 mg total) by mouth daily.  Dispense: 30 tablet; Refill: 1  Preterm labor symptoms and general obstetric precautions including but not limited to vaginal bleeding, contractions, leaking of fluid and fetal movement were reviewed in detail with the patient. Please refer to After Visit Summary for other counseling recommendations.   Return in about 1 week (around 01/04/2021).  Future Appointments  Date Time Provider Department Center  12/28/2020 10:30 AM [facility] NURSE [facility]  12/28/2020 10:45 AM [facility] NST [facility]  12/31/2020 12:30 PM [facility] NURSE [facility]  12/31/2020 12:45 PM [facility] US5 [facility]  01/03/2021 10:30 AM [facility] NURSE [facility]  01/03/2021 10:45 AM [facility] NST [facility]  01/06/2021 11:00 AM [facility] NURSE [facility]  01/06/2021 11:15 AM [facility] US2 [facility]  01/11/2021 10:30 AM [facility] NURSE [facility]  01/11/2021 10:45 AM [facility] NST [facility]  01/14/2021 12:30 PM [facility] NURSE [facility]  01/14/2021 12:45 PM [facility] US5 [facility]    Scheryl Darter, MD

## 2020-12-28 NOTE — Procedures (Signed)
Eldean Klatt Jan 02, 1996 [redacted]w[redacted]d  Fetus A Non-Stress Test Interpretation for 12/28/20  Indication: Chronic Hypertenstion  Fetal Heart Rate A Mode: External Baseline Rate (A): 130 bpm Variability: Moderate Accelerations: 15 x 15 Decelerations: None Multiple birth?: No  Uterine Activity Mode: Palpation, Toco Contraction Frequency (min): None Resting Tone Palpated: Relaxed Resting Time: Adequate  Interpretation (Fetal Testing) Nonstress Test Interpretation: Reactive Comments: Dr. Grace Bushy reviewed tracing.

## 2020-12-29 ENCOUNTER — Ambulatory Visit (HOSPITAL_COMMUNITY)
Admission: EM | Admit: 2020-12-29 | Discharge: 2020-12-29 | Disposition: A | Discharge status: Home / Self Care | Payer: BC Managed Care – PPO

## 2020-12-29 ENCOUNTER — Other Ambulatory Visit: Payer: Self-pay

## 2020-12-29 DIAGNOSIS — F4322 Adjustment disorder with anxiety: Secondary | ICD-10-CM

## 2020-12-29 NOTE — BH Assessment (Signed)
Pt reports worsening depression and anxiety for the past week. Pt reports going to OBGYN who recommended she come here due to having two anxiety attacks within the past week. Pt does not have outpt services. 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 Pt reports preeclampsia. Pt reports hx of anxiety attack about 6 years ago. Pt denies SI, HI, AVH and substance use.   Pt is routine.

## 2020-12-29 NOTE — Progress Notes (Unsigned)
Amb

## 2020-12-29 NOTE — ED Provider Notes (Signed)
Behavioral Health Urgent Care Medical Screening Exam  Patient Name: Theresa Patterson MRN: [id number] Date of Evaluation: 12/29/20 Chief Complaint:   Diagnosis:  Final diagnoses:  Adjustment disorder with anxious mood    History of Present illness: Theresa Patterson is a 25 y.o. female.  Patient presents voluntarily to [facility] behavioral health for walk-in assessment.  She states "I have been feeling depressed for 1 week, I feel low motivation and decreased appetite."  She reports she was diagnosed with generalized anxiety disorder approximately 6 years ago after a friend was murdered.  She was briefly prescribed Xanax at that time but did not continue the medication.  She reports the episode passed rather quickly.  She is not currently linked to outpatient psychiatry but did meet with a therapist at her obstetric office on yesterday.  She was provided a list of coping skills and has reviewed this list.  She would like to be linked to outpatient psychiatry and counseling for individual therapy.  She denies current medications to address mood.  She reports recent stressors include a diagnosis of preeclampsia that has placed her on work restriction and she has been unable to work for several months.  Additionally she has recently been diagnosed with gallstones.  She resides in [place] with her boyfriend however her main support network is in [place] including her mother.  Patient is assessed face-to-face by nurse practitioner.  She is seated in assessment area, no acute distress.  She is alert and oriented, pleasant and cooperative during assessment.  She reports depressed mood with congruent affect.   She denies suicidal and homicidal ideations.  She denies any history of suicide attempts, denies history of self-harm.  She contracts verbally for safety with this Clinical research associate.   She has normal speech and behavior.  She denies both auditory and visual hallucinations.  Patient is able to converse  coherently with goal-directed thoughts and no distractibility or preoccupation.  She denies paranoia.  Objectively there is no evidence of psychosis/mania or delusional thinking.  Theresa Patterson resides in [place] with her boyfriend, denies access to weapons.  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  Her baby is due around February 13, 2021.  She denies alcohol or substance use.  She endorses average sleep and decreased appetite.  Patient offered support and encouragement.  She gives verbal consent to speak with her boyfriend, Clifton Custard phone number [phone].  Spoke with patient's boyfriend who agrees with plan to follow-up with outpatient psychiatry, he confirms no weapons in the home.  Safety planning completed with patient's boyfriend.   Psychiatric Specialty Exam  Presentation  General Appearance:Appropriate for Environment; Casual  Eye Contact:Good  Speech:Normal Rate; Clear and Coherent  Speech Volume:Normal  Handedness:Right   Mood and Affect  Mood:Anxious  Affect:Congruent; Appropriate   Thought Process  Thought Processes:Coherent; Goal Directed; Linear  Descriptions of Associations:Intact  Orientation:No data recorded Thought Content:Logical; WDL    Hallucinations:None  Ideas of Reference:None  Suicidal Thoughts:No  Homicidal Thoughts:No   Sensorium  Memory:Immediate Good; Recent Good; Remote Good  Judgment:Good  Insight:Good   Executive Functions  Concentration:Good  Attention Span:Good  Recall:Good  Fund of Knowledge:Good  Language:Good   Psychomotor Activity  Psychomotor Activity:Normal   Assets  Assets:Communication Skills; Desire for Improvement; Financial Resources/Insurance; Housing; Intimacy; Leisure Time; Physical Health; Social Support; Resilience; Talents/Skills; Transportation   Sleep  Sleep:Fair  Number of hours:  No data recorded  No data recorded  Physical Exam: Physical  Exam  Vitals and nursing note reviewed.  Constitutional:      Appearance: Normal appearance. She is well-developed.  HENT:     Head: Normocephalic and atraumatic.     Nose: Nose normal.  Cardiovascular:     Rate and Rhythm: Normal rate.  Pulmonary:     Effort: Pulmonary effort is normal.  Musculoskeletal:        General: Normal range of motion.     Cervical back: Normal range of motion.  Skin:    General: Skin is warm and dry.  Neurological:     Mental Status: She is alert and oriented to person, place, and time.  Psychiatric:        Attention and Perception: Attention and perception normal.        Mood and Affect: Affect normal. Mood is anxious.        Speech: Speech normal.        Behavior: Behavior normal. Behavior is cooperative.        Thought Content: Thought content normal.        Cognition and Memory: Cognition and memory normal.        Judgment: Judgment normal.   Review of Systems  Constitutional: Negative.   HENT: Negative.    Eyes: Negative.   Respiratory: Negative.    Cardiovascular: Negative.   Gastrointestinal: Negative.   Genitourinary: Negative.   Musculoskeletal: Negative.   Skin: Negative.   Neurological: Negative.   Endo/Heme/Allergies: Negative.   Psychiatric/Behavioral:  The patient is nervous/anxious.   Blood pressure (!) 154/97, pulse (!) 102, temperature 98.3 F (36.8 C), temperature source Oral, resp. rate 18, last menstrual period 05/09/2020, SpO2 100 %. There is no height or weight on file to calculate BMI.  Musculoskeletal: Strength & Muscle Tone: within normal limits Gait & Station: normal Patient leans: N/A   [facility] MSE Discharge Disposition for Follow up and Recommendations: Based on my evaluation the patient does not appear to have an emergency medical condition and can be discharged with resources and follow up care in outpatient services for Medication Management and Individual Therapy Patient reviewed with Dr. Bronwen Betters. Follow-up  with outpatient psychiatry, resources provided.   Lenard Lance, FNP 12/29/2020, 6:53 PM

## 2020-12-29 NOTE — Discharge Instructions (Signed)

## 2020-12-30 ENCOUNTER — Telehealth (HOSPITAL_COMMUNITY): Payer: Self-pay

## 2020-12-30 NOTE — BH Assessment (Signed)
Care Management - Follow Up [facility] Discharges   Writer attempted to make contact with patient today and was unsuccessful.  Writer left a HIPPA compliant voice message.   Per chart review, follow up care in outpatient services with [facility] were provided to the patient at discharge.

## 2020-12-31 ENCOUNTER — Other Ambulatory Visit: Payer: Self-pay

## 2020-12-31 ENCOUNTER — Ambulatory Visit: Payer: BC Managed Care – PPO | Attending: Obstetrics

## 2020-12-31 ENCOUNTER — Ambulatory Visit: Payer: BC Managed Care – PPO | Admitting: *Deleted

## 2020-12-31 VITALS — BP 126/80 | HR 111

## 2020-12-31 DIAGNOSIS — E669 Obesity, unspecified: Secondary | ICD-10-CM

## 2020-12-31 DIAGNOSIS — O113 Pre-existing hypertension with pre-eclampsia, third trimester: Secondary | ICD-10-CM | POA: Diagnosis present

## 2020-12-31 DIAGNOSIS — O99213 Obesity complicating pregnancy, third trimester: Secondary | ICD-10-CM

## 2020-12-31 DIAGNOSIS — Z3A33 33 weeks gestation of pregnancy: Secondary | ICD-10-CM | POA: Diagnosis not present

## 2020-12-31 DIAGNOSIS — O10013 Pre-existing essential hypertension complicating pregnancy, third trimester: Secondary | ICD-10-CM

## 2020-12-31 DIAGNOSIS — Z362 Encounter for other antenatal screening follow-up: Secondary | ICD-10-CM | POA: Insufficient documentation

## 2020-12-31 DIAGNOSIS — O0993 Supervision of high risk pregnancy, unspecified, third trimester: Secondary | ICD-10-CM

## 2020-12-31 DIAGNOSIS — D563 Thalassemia minor: Secondary | ICD-10-CM

## 2020-12-31 DIAGNOSIS — O10913 Unspecified pre-existing hypertension complicating pregnancy, third trimester: Secondary | ICD-10-CM

## 2020-12-31 DIAGNOSIS — O119 Pre-existing hypertension with pre-eclampsia, unspecified trimester: Secondary | ICD-10-CM

## 2020-12-31 DIAGNOSIS — R519 Headache, unspecified: Secondary | ICD-10-CM

## 2021-01-03 ENCOUNTER — Ambulatory Visit: Payer: BC Managed Care – PPO | Admitting: *Deleted

## 2021-01-03 ENCOUNTER — Other Ambulatory Visit: Payer: Self-pay | Admitting: Obstetrics and Gynecology

## 2021-01-03 ENCOUNTER — Other Ambulatory Visit: Payer: Self-pay

## 2021-01-03 ENCOUNTER — Ambulatory Visit (HOSPITAL_BASED_OUTPATIENT_CLINIC_OR_DEPARTMENT_OTHER): Payer: BC Managed Care – PPO

## 2021-01-03 ENCOUNTER — Encounter: Payer: Self-pay | Admitting: *Deleted

## 2021-01-03 ENCOUNTER — Ambulatory Visit: Payer: BC Managed Care – PPO | Attending: Obstetrics | Admitting: *Deleted

## 2021-01-03 VITALS — BP 122/74 | HR 108

## 2021-01-03 DIAGNOSIS — E669 Obesity, unspecified: Secondary | ICD-10-CM | POA: Diagnosis not present

## 2021-01-03 DIAGNOSIS — O99213 Obesity complicating pregnancy, third trimester: Secondary | ICD-10-CM | POA: Diagnosis not present

## 2021-01-03 DIAGNOSIS — Z79899 Other long term (current) drug therapy: Secondary | ICD-10-CM | POA: Insufficient documentation

## 2021-01-03 DIAGNOSIS — Z3A34 34 weeks gestation of pregnancy: Secondary | ICD-10-CM

## 2021-01-03 DIAGNOSIS — O0993 Supervision of high risk pregnancy, unspecified, third trimester: Secondary | ICD-10-CM

## 2021-01-03 DIAGNOSIS — O288 Other abnormal findings on antenatal screening of mother: Secondary | ICD-10-CM | POA: Diagnosis not present

## 2021-01-03 DIAGNOSIS — O10919 Unspecified pre-existing hypertension complicating pregnancy, unspecified trimester: Secondary | ICD-10-CM

## 2021-01-03 DIAGNOSIS — O26893 Other specified pregnancy related conditions, third trimester: Secondary | ICD-10-CM

## 2021-01-03 DIAGNOSIS — D563 Thalassemia minor: Secondary | ICD-10-CM

## 2021-01-03 DIAGNOSIS — O113 Pre-existing hypertension with pre-eclampsia, third trimester: Secondary | ICD-10-CM | POA: Diagnosis not present

## 2021-01-03 DIAGNOSIS — R519 Headache, unspecified: Secondary | ICD-10-CM

## 2021-01-03 DIAGNOSIS — O119 Pre-existing hypertension with pre-eclampsia, unspecified trimester: Secondary | ICD-10-CM

## 2021-01-03 NOTE — Procedures (Signed)
Bennye Nix 1995-03-18 [redacted]w[redacted]d  Fetus A Non-Stress Test Interpretation for 01/03/21  Indication: Chronic Hypertenstion, obesity  Fetal Heart Rate A Mode: External Baseline Rate (A): 135 bpm Variability: Moderate Accelerations:  (10x10 X 1, 15x15 X 1) Decelerations: None  Uterine Activity Mode: Palpation, Toco Contraction Frequency (min): None Resting Tone Palpated: Relaxed Resting Time: Adequate  Interpretation (Fetal Testing) Nonstress Test Interpretation: Non-reactive (Given PO fluids, R tilt, extended time) Comments: Dr. Judeth Cornfield reviewed tracing. Patient to proceed with BPP.

## 2021-01-04 ENCOUNTER — Ambulatory Visit (INDEPENDENT_AMBULATORY_CARE_PROVIDER_SITE_OTHER): Payer: BC Managed Care – PPO | Admitting: Obstetrics & Gynecology

## 2021-01-04 ENCOUNTER — Ambulatory Visit (INDEPENDENT_AMBULATORY_CARE_PROVIDER_SITE_OTHER): Payer: BC Managed Care – PPO | Admitting: Licensed Clinical Social Worker

## 2021-01-04 ENCOUNTER — Other Ambulatory Visit: Payer: Self-pay | Admitting: Obstetrics & Gynecology

## 2021-01-04 VITALS — BP 135/80 | HR 110 | Wt 333.0 lb

## 2021-01-04 DIAGNOSIS — O119 Pre-existing hypertension with pre-eclampsia, unspecified trimester: Secondary | ICD-10-CM

## 2021-01-04 DIAGNOSIS — F419 Anxiety disorder, unspecified: Secondary | ICD-10-CM

## 2021-01-04 DIAGNOSIS — O99340 Other mental disorders complicating pregnancy, unspecified trimester: Secondary | ICD-10-CM | POA: Diagnosis not present

## 2021-01-04 DIAGNOSIS — O0993 Supervision of high risk pregnancy, unspecified, third trimester: Secondary | ICD-10-CM

## 2021-01-04 MED ORDER — SERTRALINE HCL 50 MG PO TABS: 50.0000 mg | ORAL_TABLET | Freq: Every day | ORAL | 1 refills | Status: DC

## 2021-01-04 NOTE — BH Specialist Note (Signed)
Integrated Behavioral Health Follow Up In-Person Visit  MRN: [id number] Name: Theresa Patterson  Number of Integrated Behavioral Health Clinician visits: 2/6 Session Start time: 11:00am  Session End time: 11:23am Total time: 23 minutes in person at [facility]   Types of Service: Individual psychotherapy  Interpretor:No. Interpretor Name and Language: none  Subjective: [name] is a 25 y.o. female accompanied by n/a Patient was referred by Dr. Marcie Bal  for anxiety. Patient reports the following symptoms/concerns: anxieity, panic attacks  Duration of problem: over one year ; Severity of problem: mild  Objective: Mood: Anxious and Affect: Appropriate Risk of harm to self or others: No plan to harm self or others  Life Context: Family and Social: Lives with boyfriend  School/Work: n/a Self-Care: sleep Life Changes: new pregnancy  Patient and/or Family's Strengths/Protective Factors: Concrete supports in place (healthy food, safe environments, etc.)  Goals Addressed: Patient will:  Reduce symptoms of: anxiety   Increase knowledge and/or ability of: coping skills   Demonstrate ability to: Increase healthy adjustment to current life circumstances  Progress towards Goals: Ongoing  Interventions: Interventions utilized:  Supportive Counseling Standardized Assessments completed: PHQ 9  Patient and/or Family Response: [name] reports recent panic attacks triggered by pending delivery. [name] reports increase worry and anxious mood becoming a new parent. [name] reports adequate family and social support.   Assessment: Patient currently experiencing anxiety affecting pregnancy.   Patient may benefit from integrated behavioral health.  Plan: Follow up with behavioral health clinician on : 4 weeks  Behavioral recommendations: Mindfulness, deep breathing exercises to decrease anxious mood, communicate concerns with supportive person, take prenatal vitamins and  medication as directed.  Referral(s): Integrated [employer] (In Clinic) "From scale of 1-10, how likely are you to follow plan?":   Gwyndolyn Saxon, LCSW

## 2021-01-04 NOTE — Progress Notes (Signed)
   PRENATAL VISIT NOTE  Subjective:  Theresa Patterson is a 25 y.o. G2P0010 at [redacted]w[redacted]d being seen today for ongoing prenatal care.  She is currently monitored for the following issues for this high-risk pregnancy and has Supervision of high-risk pregnancy; Maternal morbid obesity, antepartum (HCC); Chronic hypertension with superimposed preeclampsia; Morbid obesity (HCC); Chronic headaches; Alpha thalassemia silent carrier; Headache in pregnancy, antepartum, third trimester; BMI 45.0-49.9, adult (HCC); and Vaginal discharge during pregnancy in third trimester on their problem list.  Patient reports no complaints.  Contractions: Irritability. Vag. Bleeding: None.  Movement: Present. Denies leaking of fluid.   The following portions of the patient's history were reviewed and updated as appropriate: allergies, current medications, past family history, past medical history, past social history, past surgical history and problem list.   Objective:   Vitals:   01/04/21 1033  BP: 135/80  Pulse: (!) 110  Weight: (!) 333 lb (151 kg)    Fetal Status: Fetal Heart Rate (bpm): 160   Movement: Present     General:  Alert, oriented and cooperative. Patient is in no acute distress.  Skin: Skin is warm and dry. No rash noted.   Cardiovascular: Normal heart rate noted  Respiratory: Normal respiratory effort, no problems with respiration noted  Abdomen: Soft, gravid, appropriate for gestational age.  Pain/Pressure: Present     Pelvic: Cervical exam deferred        Extremities: Normal range of motion.  Edema: Trace  Mental Status: Normal mood and affect. Normal behavior. Normal judgment and thought content.   Assessment and Plan:  Pregnancy: G2P0010 at [redacted]w[redacted]d 1. Supervision of high risk pregnancy in third trimester Normal growth  2. Chronic hypertension with superimposed preeclampsia BP controlled  3. Anxiety disorder affecting pregnancy, antepartum Behavioral health f/u  Preterm labor symptoms and  general obstetric precautions including but not limited to vaginal bleeding, contractions, leaking of fluid and fetal movement were reviewed in detail with the patient. Please refer to After Visit Summary for other counseling recommendations.   Return in about 1 week (around 01/11/2021).  Future Appointments  Date Time Provider Department Center  01/04/2021 11:00 AM Gwyndolyn Saxon, [place] [facility] None  01/06/2021 11:00 AM [facility] NURSE [facility]  01/06/2021 11:15 AM [facility] US2 [facility]  01/11/2021  8:55 AM Brock Bad, [place] [facility] None  01/11/2021 10:30 AM [facility] NURSE [facility]  01/11/2021 10:45 AM [facility] NST [facility]  01/14/2021 12:30 PM [facility] NURSE [facility]  01/14/2021 12:45 PM [facility] US5 [facility]    Scheryl Darter, MD

## 2021-01-04 NOTE — Progress Notes (Signed)
Patient presents for ROB. Patient has no concerns today. 

## 2021-01-04 NOTE — Progress Notes (Signed)
Meds ordered this encounter  Medications   sertraline (ZOLOFT) 50 MG tablet    Sig: Take 1 tablet (50 mg total) by mouth daily.    Dispense:  30 tablet    Refill:  1

## 2021-01-06 ENCOUNTER — Encounter: Payer: Self-pay | Admitting: *Deleted

## 2021-01-06 ENCOUNTER — Other Ambulatory Visit: Payer: Self-pay

## 2021-01-06 ENCOUNTER — Ambulatory Visit: Payer: BC Managed Care – PPO | Admitting: *Deleted

## 2021-01-06 ENCOUNTER — Ambulatory Visit: Payer: BC Managed Care – PPO | Attending: Obstetrics

## 2021-01-06 VITALS — BP 124/72 | HR 129

## 2021-01-06 DIAGNOSIS — R519 Headache, unspecified: Secondary | ICD-10-CM | POA: Diagnosis present

## 2021-01-06 DIAGNOSIS — Z3A34 34 weeks gestation of pregnancy: Secondary | ICD-10-CM

## 2021-01-06 DIAGNOSIS — O10013 Pre-existing essential hypertension complicating pregnancy, third trimester: Secondary | ICD-10-CM

## 2021-01-06 DIAGNOSIS — O0993 Supervision of high risk pregnancy, unspecified, third trimester: Secondary | ICD-10-CM | POA: Diagnosis present

## 2021-01-06 DIAGNOSIS — O119 Pre-existing hypertension with pre-eclampsia, unspecified trimester: Secondary | ICD-10-CM | POA: Insufficient documentation

## 2021-01-06 DIAGNOSIS — O26893 Other specified pregnancy related conditions, third trimester: Secondary | ICD-10-CM | POA: Diagnosis present

## 2021-01-06 DIAGNOSIS — D563 Thalassemia minor: Secondary | ICD-10-CM | POA: Diagnosis present

## 2021-01-06 DIAGNOSIS — O113 Pre-existing hypertension with pre-eclampsia, third trimester: Secondary | ICD-10-CM | POA: Diagnosis not present

## 2021-01-06 DIAGNOSIS — O10913 Unspecified pre-existing hypertension complicating pregnancy, third trimester: Secondary | ICD-10-CM | POA: Diagnosis not present

## 2021-01-07 ENCOUNTER — Other Ambulatory Visit: Payer: Self-pay

## 2021-01-07 ENCOUNTER — Inpatient Hospital Stay (HOSPITAL_COMMUNITY)
Admission: AD | Admit: 2021-01-07 | Discharge: 2021-01-07 | Disposition: A | Discharge status: Home / Self Care | Payer: BC Managed Care – PPO | Attending: Obstetrics & Gynecology | Admitting: Obstetrics & Gynecology

## 2021-01-07 ENCOUNTER — Encounter (HOSPITAL_COMMUNITY): Payer: Self-pay | Admitting: Obstetrics & Gynecology

## 2021-01-07 DIAGNOSIS — D563 Thalassemia minor: Secondary | ICD-10-CM

## 2021-01-07 DIAGNOSIS — O26893 Other specified pregnancy related conditions, third trimester: Secondary | ICD-10-CM | POA: Diagnosis not present

## 2021-01-07 DIAGNOSIS — O0993 Supervision of high risk pregnancy, unspecified, third trimester: Secondary | ICD-10-CM

## 2021-01-07 DIAGNOSIS — R519 Headache, unspecified: Secondary | ICD-10-CM

## 2021-01-07 DIAGNOSIS — Z3A34 34 weeks gestation of pregnancy: Secondary | ICD-10-CM | POA: Diagnosis not present

## 2021-01-07 DIAGNOSIS — O99891 Other specified diseases and conditions complicating pregnancy: Secondary | ICD-10-CM

## 2021-01-07 DIAGNOSIS — O368130 Decreased fetal movements, third trimester, not applicable or unspecified: Secondary | ICD-10-CM | POA: Diagnosis not present

## 2021-01-07 DIAGNOSIS — O119 Pre-existing hypertension with pre-eclampsia, unspecified trimester: Secondary | ICD-10-CM

## 2021-01-07 DIAGNOSIS — R109 Unspecified abdominal pain: Secondary | ICD-10-CM | POA: Insufficient documentation

## 2021-01-07 DIAGNOSIS — M549 Dorsalgia, unspecified: Secondary | ICD-10-CM | POA: Diagnosis not present

## 2021-01-07 LAB — OB RESULTS CONSOLE GC/CHLAMYDIA: Gonorrhea: NEGATIVE

## 2021-01-07 LAB — WET PREP, GENITAL
Clue Cells Wet Prep HPF POC: NONE SEEN
Sperm: NONE SEEN
Trich, Wet Prep: NONE SEEN
Yeast Wet Prep HPF POC: NONE SEEN

## 2021-01-07 NOTE — MAU Provider Note (Addendum)
History     CSN: [id number]  Arrival date and time: 01/07/21 1742   Event Date/Time   First Provider Initiated Contact with Patient 01/07/21 1934      Chief Complaint  Patient presents with   Abdominal Pain   Ms. Mireyah Chervenak is a 25 y.o. year old G57P0010 female at [redacted]w[redacted]d weeks gestation who presents to MAU reporting lower back pain @ 1000. She describes it as sharp, intermittent and increasing in intensity. She reports that the pain radiates through to her rectum. She also reports DFM x 1 hour. She denies any VB or LOF. Her FOB is present and contributing to the history taking.    OB History     Gravida  2   Para      Term      Preterm      AB  1   Living         SAB  1   IAB      Ectopic      Multiple      Live Births              Past Medical History:  Diagnosis Date   Alpha thalassemia silent carrier 10/15/2020   Chronic headaches 09/30/2020   Hypertension    Morbid obesity (HCC) 08/20/2020   Polycystic ovary syndrome     Past Surgical History:  Procedure Laterality Date   NO PAST SURGERIES      Family History  Problem Relation Age of Onset   Obesity Mother    Obesity Maternal Grandmother     Social History   Tobacco Use   Smoking status: Never   Smokeless tobacco: Never  Vaping Use   Vaping Use: Never used  Substance Use Topics   Alcohol use: Not Currently    Comment: not since confirmed pregnancy   Drug use: Not Currently    Allergies: No Known Allergies  No medications prior to admission.    Review of Systems  Constitutional: Negative.   HENT: Negative.    Eyes: Negative.   Respiratory: Negative.    Cardiovascular: Negative.   Gastrointestinal:  Positive for rectal pain.  Endocrine: Negative.   Genitourinary: Negative.   Musculoskeletal:  Positive for back pain.  Skin: Negative.   Allergic/Immunologic: Negative.   Neurological: Negative.   Hematological: Negative.   Psychiatric/Behavioral: Negative.     Physical Exam   Blood pressure 136/78, pulse (!) 118, temperature 99 F (37.2 C), temperature source Oral, resp. rate 20, last menstrual period 05/09/2020, SpO2 97 %.  Physical Exam Vitals and nursing note reviewed.  Constitutional:      Appearance: Normal appearance. She is obese.  Cardiovascular:     Rate and Rhythm: Tachycardia present.  Pulmonary:     Effort: Pulmonary effort is normal.  Abdominal:     Palpations: Abdomen is soft.  Musculoskeletal:        General: Normal range of motion.  Skin:    General: Skin is warm and dry.  Neurological:     Mental Status: She is alert and oriented to person, place, and time.  Psychiatric:        Mood and Affect: Mood normal.        Behavior: Behavior normal.        Thought Content: Thought content normal.        Judgment: Judgment normal.   Dilation: Closed Effacement (%): Thick Cervical Position: Posterior Presentation: Undeterminable Exam by:: Carloyn Jaeger, CNM  REACTIVE NST - FHR:  140 bpm / moderate variability / accels present / decels absent / TOCO: UI noted MAU Course  Procedures  MDM Wet Prep - Normal GC/CT - Negative  Assessment and Plan  Back pain affecting pregnancy in third trimester - Take Tylenol for pain - Information provided on back pain in preg   Headache in pregnancy, antepartum, third trimester  [redacted] weeks gestation of pregnancy   - Discharge patient - Keep scheduled appt at [facility] on 01/11/21 - Patient verbalized an understanding of the plan of care and agrees.   Raelyn Mora, CNM 01/07/2021, 7:34 PM

## 2021-01-07 NOTE — MAU Note (Addendum)
...  Theresa Patterson is a 25 y.o. at [redacted]w[redacted]d here in MAU reporting: Lower cramping in her back that began this morning around 1000. She states the cramping is intermittent and has increased in intensity. She states occasionally she will have sharp pains that radiate through her rectum. Decreased fetal movement over the past hour. No VB or LOF.  Patient has CHTN with Superimposed Pre-Eclampsia. Patient denies HA, visual disturbances, RUQ pain, and edema.  Pain score:  4/10 lower back  FHT: 141 initial external Lab orders placed from triage:  UA

## 2021-01-07 NOTE — Discharge Instructions (Signed)
Soak in tub of warm water with 1/2 cup Epsom salt in it. Take Flexeril 10 mg as directed for back pain also. If no relief in back pain in 2 hours, take Tylenol 1000 mg.

## 2021-01-10 LAB — GC/CHLAMYDIA PROBE AMP (~~LOC~~) NOT AT ARMC
Chlamydia: NEGATIVE
Comment: NEGATIVE
Comment: NORMAL
Neisseria Gonorrhea: NEGATIVE

## 2021-01-11 ENCOUNTER — Encounter (HOSPITAL_COMMUNITY): Payer: Self-pay | Admitting: Obstetrics & Gynecology

## 2021-01-11 ENCOUNTER — Inpatient Hospital Stay (HOSPITAL_COMMUNITY)
Admission: AD | Admit: 2021-01-11 | Discharge: 2021-01-11 | Disposition: A | Discharge status: Home / Self Care | Payer: BC Managed Care – PPO | Attending: Obstetrics & Gynecology | Admitting: Obstetrics & Gynecology

## 2021-01-11 ENCOUNTER — Encounter: Payer: BC Managed Care – PPO | Admitting: Obstetrics

## 2021-01-11 ENCOUNTER — Inpatient Hospital Stay (HOSPITAL_BASED_OUTPATIENT_CLINIC_OR_DEPARTMENT_OTHER): Payer: BC Managed Care – PPO

## 2021-01-11 ENCOUNTER — Ambulatory Visit: Payer: BC Managed Care – PPO | Attending: Obstetrics

## 2021-01-11 ENCOUNTER — Other Ambulatory Visit: Payer: Self-pay

## 2021-01-11 ENCOUNTER — Ambulatory Visit: Payer: BC Managed Care – PPO

## 2021-01-11 ENCOUNTER — Encounter: Payer: Self-pay | Admitting: Obstetrics and Gynecology

## 2021-01-11 ENCOUNTER — Ambulatory Visit (INDEPENDENT_AMBULATORY_CARE_PROVIDER_SITE_OTHER): Payer: BC Managed Care – PPO | Admitting: Obstetrics and Gynecology

## 2021-01-11 VITALS — BP 140/90 | HR 110 | Wt 326.0 lb

## 2021-01-11 DIAGNOSIS — R519 Headache, unspecified: Secondary | ICD-10-CM | POA: Diagnosis not present

## 2021-01-11 DIAGNOSIS — E669 Obesity, unspecified: Secondary | ICD-10-CM | POA: Diagnosis not present

## 2021-01-11 DIAGNOSIS — O26893 Other specified pregnancy related conditions, third trimester: Secondary | ICD-10-CM | POA: Diagnosis not present

## 2021-01-11 DIAGNOSIS — Z79899 Other long term (current) drug therapy: Secondary | ICD-10-CM | POA: Insufficient documentation

## 2021-01-11 DIAGNOSIS — O10913 Unspecified pre-existing hypertension complicating pregnancy, third trimester: Secondary | ICD-10-CM | POA: Insufficient documentation

## 2021-01-11 DIAGNOSIS — O99213 Obesity complicating pregnancy, third trimester: Secondary | ICD-10-CM | POA: Diagnosis not present

## 2021-01-11 DIAGNOSIS — Z148 Genetic carrier of other disease: Secondary | ICD-10-CM | POA: Insufficient documentation

## 2021-01-11 DIAGNOSIS — Z3A35 35 weeks gestation of pregnancy: Secondary | ICD-10-CM | POA: Insufficient documentation

## 2021-01-11 DIAGNOSIS — O119 Pre-existing hypertension with pre-eclampsia, unspecified trimester: Secondary | ICD-10-CM

## 2021-01-11 DIAGNOSIS — Z3689 Encounter for other specified antenatal screening: Secondary | ICD-10-CM | POA: Diagnosis not present

## 2021-01-11 DIAGNOSIS — O0993 Supervision of high risk pregnancy, unspecified, third trimester: Secondary | ICD-10-CM

## 2021-01-11 DIAGNOSIS — D563 Thalassemia minor: Secondary | ICD-10-CM

## 2021-01-11 LAB — COMPREHENSIVE METABOLIC PANEL
ALT: 27 U/L (ref 0–44)
AST: 19 U/L (ref 15–41)
Albumin: 2.8 g/dL — ABNORMAL LOW (ref 3.5–5.0)
Alkaline Phosphatase: 121 U/L (ref 38–126)
Anion gap: 7 (ref 5–15)
BUN: 6 mg/dL (ref 6–20)
CO2: 19 mmol/L — ABNORMAL LOW (ref 22–32)
Calcium: 9.6 mg/dL (ref 8.9–10.3)
Chloride: 106 mmol/L (ref 98–111)
Creatinine, Ser: 0.61 mg/dL (ref 0.44–1.00)
GFR, Estimated: >60 mL/min (ref >60)
Glucose, Bld: 77 mg/dL (ref 70–99)
Potassium: 3.7 mmol/L (ref 3.5–5.1)
Sodium: 132 mmol/L — ABNORMAL LOW (ref 135–145)
Total Bilirubin: 0.4 mg/dL (ref 0.3–1.2)
Total Protein: 6.4 g/dL — ABNORMAL LOW (ref 6.5–8.1)

## 2021-01-11 LAB — CBC
HCT: 35.7 % — ABNORMAL LOW (ref 36.0–46.0)
Hemoglobin: 11.6 g/dL — ABNORMAL LOW (ref 12.0–15.0)
MCH: 26.4 pg (ref 26.0–34.0)
MCHC: 32.5 g/dL (ref 30.0–36.0)
MCV: 81.1 fL (ref 80.0–100.0)
Platelets: 267 10*3/uL (ref 150–400)
RBC: 4.4 MIL/uL (ref 3.87–5.11)
RDW: 14.5 % (ref 11.5–15.5)
WBC: 8 10*3/uL (ref 4.0–10.5)
nRBC: 0 % (ref 0.0–0.2)

## 2021-01-11 LAB — PROTEIN / CREATININE RATIO, URINE
Creatinine, Urine: 166.13 mg/dL
Protein Creatinine Ratio: 0.1 mg/mg{Cre} (ref 0.00–0.15)
Total Protein, Urine: 17 mg/dL

## 2021-01-11 MED ORDER — CYCLOBENZAPRINE HCL 5 MG PO TABS
10.0000 mg | ORAL_TABLET | Freq: Once | ORAL | Status: AC
  Administered 2021-01-11: 10 mg via ORAL
  Filled 2021-01-11: qty 2

## 2021-01-11 MED ORDER — ACETAMINOPHEN 500 MG PO TABS
1000.0000 mg | ORAL_TABLET | Freq: Once | ORAL | Status: AC
  Administered 2021-01-11: 1000 mg via ORAL
  Filled 2021-01-11: qty 2

## 2021-01-11 MED ORDER — METOCLOPRAMIDE HCL 10 MG PO TABS: 10.0000 mg | ORAL_TABLET | Freq: Three times a day (TID) | ORAL | 0 refills | Status: DC | PRN

## 2021-01-11 NOTE — Progress Notes (Addendum)
Patient arrived from GYN office, and we sent by Dr. Alysia Penna. Patient stated that she has been experiencing headache, and nausea since yesterday at 5pm. Patient stated that her bp was 140/90. No vaginal bleeding, no leakage of fluid and or ctx. + FM reported.  Patient stated is no bp meds. She stated hat she  took her procardia today, and tylenol at 4am. Patient stated that the pain did not get better and remained 7/10

## 2021-01-11 NOTE — Progress Notes (Signed)
Subjective:  Theresa Patterson is a 25 y.o. G2P0010 at [redacted]w[redacted]d being seen today for ongoing prenatal care.  She is currently monitored for the following issues for this high-risk pregnancy and has Supervision of high-risk pregnancy; Maternal morbid obesity, antepartum (HCC); Chronic hypertension with superimposed preeclampsia; Morbid obesity (HCC); Chronic headaches; Alpha thalassemia silent carrier; Headache in pregnancy, antepartum, third trimester; BMI 45.0-49.9, adult (HCC); and Vaginal discharge during pregnancy in third trimester on their problem list.  Patient reports HA since last evening, some improvement with Tylenol. No visual changes.  Contractions: Not present. Vag. Bleeding: None.  Movement: Present. Denies leaking of fluid.   The following portions of the patient's history were reviewed and updated as appropriate: allergies, current medications, past family history, past medical history, past social history, past surgical history and problem list. Problem list updated.  Objective:   Vitals:   01/11/21 0858  BP: 140/90  Pulse: (!) 110  Weight: (!) 326 lb (147.9 kg)    Fetal Status: Fetal Heart Rate (bpm): 150   Movement: Present     General:  Alert, oriented and cooperative. Patient is in no acute distress.  Skin: Skin is warm and dry. No rash noted.   Cardiovascular: Normal heart rate noted  Respiratory: Normal respiratory effort, no problems with respiration noted  Abdomen: Soft, gravid, appropriate for gestational age. Pain/Pressure: Present     Pelvic:  Cervical exam deferred        Extremities: Normal range of motion.  Edema: None  Mental Status: Normal mood and affect. Normal behavior. Normal judgment and thought content.   Urinalysis:      Assessment and Plan:  Pregnancy: G2P0010 at [redacted]w[redacted]d  1. Alpha thalassemia silent carrier Stable  2. Supervision of high risk pregnancy in third trimester Stable  3. Chronic hypertension with superimposed preeclampsia Has appt  for BPP and growth this morning. Advised pt to keep this appt and the go to MAU for further evaluation of HA.  Preterm labor symptoms and general obstetric precautions including but not limited to vaginal bleeding, contractions, leaking of fluid and fetal movement were reviewed in detail with the patient. Please refer to After Visit Summary for other counseling recommendations.  Return in about 1 week (around 01/18/2021) for OB visit, face to face, MD only.   Hermina Staggers, MD

## 2021-01-11 NOTE — MAU Provider Note (Signed)
Chief Complaint  Patient presents with   Headache   Hypertension     Event Date/Time   First Provider Initiated Contact with Patient 01/11/21 1055      S: Theresa Patterson  is a 25 y.o. y.o. year old G2P0010 female at [redacted]w[redacted]d weeks gestation who presents to MAU with elevated blood pressures. Hx of chronic hypertension. Current blood pressure medication: procardia 90 daily. Took dose this morning.  Reports headache since yesterday. Took tylenol this morning without relief. Rates pain 8/10.   Associated symptoms: endorses Headache, denies vision changes, denies epigastric pain Contractions: denies Vaginal bleeding: denies Fetal movement: good  O: Patient Vitals for the past 24 hrs:  BP Temp Temp src Pulse Resp SpO2  01/11/21 1416 131/76 98.7 F (37.1 C) -- (!) 109 18 99 %  01/11/21 1415 -- -- -- -- -- 99 %  01/11/21 1410 -- -- -- -- -- 99 %  01/11/21 1405 -- -- -- -- -- 99 %  01/11/21 1401 139/76 -- -- (!) 106 -- --  01/11/21 1400 -- -- -- -- -- 98 %  01/11/21 1355 -- -- -- -- -- 98 %  01/11/21 1350 -- -- -- -- -- 99 %  01/11/21 1346 135/77 -- -- (!) 115 -- --  01/11/21 1345 -- -- -- -- -- 99 %  01/11/21 1335 -- -- -- -- -- 98 %  01/11/21 1331 127/74 -- -- (!) 107 -- --  01/11/21 1330 -- -- -- -- -- 99 %  01/11/21 1325 -- -- -- -- -- 99 %  01/11/21 1320 -- -- -- -- -- 99 %  01/11/21 1315 133/69 -- -- (!) 110 -- 100 %  01/11/21 1310 -- -- -- -- -- 100 %  01/11/21 1306 133/71 -- -- (!) 113 -- --  01/11/21 1231 (!) 144/78 -- -- (!) 113 -- --  01/11/21 1230 -- -- -- -- -- 99 %  01/11/21 1225 -- -- -- -- -- 100 %  01/11/21 1224 140/74 -- -- (!) 109 -- --  01/11/21 1140 -- -- -- -- -- 96 %  01/11/21 1135 -- -- -- -- -- 96 %  01/11/21 1131 132/74 -- -- (!) 118 -- --  01/11/21 1130 -- -- -- -- -- 100 %  01/11/21 1125 -- -- -- -- -- 99 %  01/11/21 1120 -- -- -- -- -- 97 %  01/11/21 1116 128/78 -- -- (!) 110 -- --  01/11/21 1115 -- -- -- -- -- 97 %  01/11/21 1110 -- -- -- -- -- 99  %  01/11/21 1105 -- -- -- -- -- 97 %  01/11/21 1100 133/72 -- -- (!) 110 -- 96 %  01/11/21 1055 -- -- -- -- -- 97 %  01/11/21 1050 -- -- -- -- -- 96 %  01/11/21 1045 129/80 -- -- (!) 113 -- 97 %  01/11/21 1041 -- -- -- -- -- 97 %  01/11/21 1040 -- -- -- -- -- 97 %  01/11/21 1035 -- -- -- -- -- 96 %  01/11/21 1031 133/71 -- -- (!) 109 -- --  01/11/21 1030 -- -- -- -- -- 98 %  01/11/21 1025 136/76 -- -- (!) 111 -- 98 %  01/11/21 1020 -- -- -- -- -- 98 %  01/11/21 1015 140/75 97.8 F (36.6 C) Oral (!) 115 17 98 %   General: NAD Heart: Regular rate Lungs: Normal rate and effort Abd: Soft, NT, Gravid, S=D Extremities: non pitting Pedal edema  Neuro: 2+ deep tendon reflexes, No clonus  Fetal Tracing:  Baseline: 135 Variability: moderate Accelerations: 15x15 Decelerations:none  Toco: none   Results for orders placed or performed during the hospital encounter of 01/11/21 (from the past 24 hour(s))  Protein / creatinine ratio, urine     Status: None   Collection Time: 01/11/21 10:45 AM  Result Value Ref Range   Creatinine, Urine 166.13 mg/dL   Total Protein, Urine 17 mg/dL   Protein Creatinine Ratio 0.10 0.00 - 0.15 mg/mg[Cre]  CBC     Status: Abnormal   Collection Time: 01/11/21 10:54 AM  Result Value Ref Range   WBC 8.0 4.0 - 10.5 K/uL   RBC 4.40 3.87 - 5.11 MIL/uL   Hemoglobin 11.6 (L) 12.0 - 15.0 g/dL   HCT 54.6 (L) 56.8 - 12.7 %   MCV 81.1 80.0 - 100.0 fL   MCH 26.4 26.0 - 34.0 pg   MCHC 32.5 30.0 - 36.0 g/dL   RDW 51.7 00.1 - 74.9 %   Platelets 267 150 - 400 K/uL   nRBC 0.0 0.0 - 0.2 %  Comprehensive metabolic panel     Status: Abnormal   Collection Time: 01/11/21 10:54 AM  Result Value Ref Range   Sodium 132 (L) 135 - 145 mmol/L   Potassium 3.7 3.5 - 5.1 mmol/L   Chloride 106 98 - 111 mmol/L   CO2 19 (L) 22 - 32 mmol/L   Glucose, Bld 77 70 - 99 mg/dL   BUN 6 6 - 20 mg/dL   Creatinine, Ser 4.49 0.44 - 1.00 mg/dL   Calcium 9.6 8.9 - 67.5 mg/dL   Total  Protein 6.4 (L) 6.5 - 8.1 g/dL   Albumin 2.8 (L) 3.5 - 5.0 g/dL   AST 19 15 - 41 U/L   ALT 27 0 - 44 U/L   Alkaline Phosphatase 121 38 - 126 U/L   Total Bilirubin 0.4 0.3 - 1.2 mg/dL   GFR, Estimated >91 >63 mL/min   Anion gap 7 5 - 15    MDM Patient presents from office for preeclampsia evaluation due to headache.  Patient has chronic hypertension & has not missed any doses of her antihypertensive. Labile BPs in MAU, no severe ranges BPs. Preeclampsia labs are normal. Headache treated with tylenol & flexeril with good response.   Tracing initially non reactive so sent for BPP. Had acceleration prior to going to ultrasound & after return from ultrasound. BPP 8/8 with normal AFI  A:  1. Chronic hypertension in obstetric context in third trimester   2. NST (non-stress test) reactive   3. [redacted] weeks gestation of pregnancy   4. Headache in pregnancy, antepartum, third trimester     P:  Discharge home Continue procardia as prescribed Rx reglan prn Reviewed reasons to return r/t preeclampsia Keep MFM appointment later this week  Judeth Horn, NP 01/11/2021 2:36 PM

## 2021-01-11 NOTE — Patient Instructions (Signed)

## 2021-01-11 NOTE — Progress Notes (Signed)
Pt states she has been having elevated BP at home. BP slightly elevated today, pt does have HA today- denies swelling/visual changes.  Pt does have appt with MFM this morning.

## 2021-01-11 NOTE — Progress Notes (Signed)
Clarification on U/S. BPP only today. Growth on 01/14/21 Will send pt directly to MAU for evaluation now. She will have prolonged monitoring while in MAU and BPP as needed. Pt informed

## 2021-01-14 ENCOUNTER — Encounter: Payer: Self-pay | Admitting: *Deleted

## 2021-01-14 ENCOUNTER — Ambulatory Visit: Payer: BC Managed Care – PPO | Admitting: *Deleted

## 2021-01-14 ENCOUNTER — Ambulatory Visit: Payer: BC Managed Care – PPO | Attending: Obstetrics

## 2021-01-14 ENCOUNTER — Other Ambulatory Visit: Payer: Self-pay

## 2021-01-14 VITALS — BP 135/77 | HR 125

## 2021-01-14 DIAGNOSIS — O26893 Other specified pregnancy related conditions, third trimester: Secondary | ICD-10-CM | POA: Diagnosis present

## 2021-01-14 DIAGNOSIS — E669 Obesity, unspecified: Secondary | ICD-10-CM | POA: Diagnosis not present

## 2021-01-14 DIAGNOSIS — O10913 Unspecified pre-existing hypertension complicating pregnancy, third trimester: Secondary | ICD-10-CM | POA: Diagnosis not present

## 2021-01-14 DIAGNOSIS — O0993 Supervision of high risk pregnancy, unspecified, third trimester: Secondary | ICD-10-CM

## 2021-01-14 DIAGNOSIS — O10013 Pre-existing essential hypertension complicating pregnancy, third trimester: Secondary | ICD-10-CM

## 2021-01-14 DIAGNOSIS — R519 Headache, unspecified: Secondary | ICD-10-CM | POA: Diagnosis present

## 2021-01-14 DIAGNOSIS — E119 Type 2 diabetes mellitus without complications: Secondary | ICD-10-CM

## 2021-01-14 DIAGNOSIS — Z3A35 35 weeks gestation of pregnancy: Secondary | ICD-10-CM

## 2021-01-14 DIAGNOSIS — O119 Pre-existing hypertension with pre-eclampsia, unspecified trimester: Secondary | ICD-10-CM | POA: Insufficient documentation

## 2021-01-14 DIAGNOSIS — D563 Thalassemia minor: Secondary | ICD-10-CM | POA: Diagnosis present

## 2021-01-14 DIAGNOSIS — O99213 Obesity complicating pregnancy, third trimester: Secondary | ICD-10-CM | POA: Diagnosis not present

## 2021-01-18 ENCOUNTER — Other Ambulatory Visit: Payer: Self-pay

## 2021-01-18 ENCOUNTER — Ambulatory Visit (INDEPENDENT_AMBULATORY_CARE_PROVIDER_SITE_OTHER): Payer: BC Managed Care – PPO | Admitting: Obstetrics and Gynecology

## 2021-01-18 ENCOUNTER — Encounter: Payer: Self-pay | Admitting: Obstetrics and Gynecology

## 2021-01-18 VITALS — BP 132/85 | HR 109 | Wt 327.9 lb

## 2021-01-18 DIAGNOSIS — O0993 Supervision of high risk pregnancy, unspecified, third trimester: Secondary | ICD-10-CM

## 2021-01-18 DIAGNOSIS — O119 Pre-existing hypertension with pre-eclampsia, unspecified trimester: Secondary | ICD-10-CM

## 2021-01-18 DIAGNOSIS — O99210 Obesity complicating pregnancy, unspecified trimester: Secondary | ICD-10-CM

## 2021-01-18 NOTE — Progress Notes (Signed)
   PRENATAL VISIT NOTE  Subjective:  Theresa Patterson is a 25 y.o. G2P0010 at [redacted]w[redacted]d being seen today for ongoing prenatal care.  She is currently monitored for the following issues for this high-risk pregnancy and has Supervision of high-risk pregnancy; Maternal morbid obesity, antepartum (HCC); Chronic hypertension with superimposed preeclampsia; Morbid obesity (HCC); Chronic headaches; Alpha thalassemia silent carrier; Headache in pregnancy, antepartum, third trimester; BMI 45.0-49.9, adult (HCC); and Vaginal discharge during pregnancy in third trimester on their problem list.  Patient reports no complaints.  Contractions: Not present.  .  Movement: Present. Denies leaking of fluid.   The following portions of the patient's history were reviewed and updated as appropriate: allergies, current medications, past family history, past medical history, past social history, past surgical history and problem list.   Objective:   Vitals:   01/18/21 0828  BP: 132/85  Pulse: (!) 109  Weight: (!) 327 lb 14.4 oz (148.7 kg)    Fetal Status: Fetal Heart Rate (bpm): 161   Movement: Present     General:  Alert, oriented and cooperative. Patient is in no acute distress.  Skin: Skin is warm and dry. No rash noted.   Cardiovascular: Normal heart rate noted  Respiratory: Normal respiratory effort, no problems with respiration noted  Abdomen: Soft, gravid, appropriate for gestational age.  Pain/Pressure: Absent     Pelvic: Cervical exam performed in the presence of a chaperone Dilation: Closed Effacement (%): Thick Station: Ballotable  Extremities: Normal range of motion.  Edema: None  Mental Status: Normal mood and affect. Normal behavior. Normal judgment and thought content.   Assessment and Plan:  Pregnancy: G2P0010 at [redacted]w[redacted]d 1. Supervision of high risk pregnancy in third trimester Patient is doing well without complaints Cultures collected today - Culture, beta strep (group b only)  2. Chronic  hypertension with superimposed preeclampsia Normotensive and without symptoms Continue current antihypertensive and ASA Patient scheduled for IOL at 37 weeks- orders in EPIC - Culture, beta strep (group b only)  3. Maternal morbid obesity, antepartum (HCC)  - Culture, beta strep (group b only)  Preterm labor symptoms and general obstetric precautions including but not limited to vaginal bleeding, contractions, leaking of fluid and fetal movement were reviewed in detail with the patient. Please refer to After Visit Summary for other counseling recommendations.   Return in about 6 months (around 07/18/2021) for postpartum.  No future appointments.  Catalina Antigua, MD

## 2021-01-19 ENCOUNTER — Other Ambulatory Visit: Payer: Self-pay | Admitting: Advanced Practice Midwife

## 2021-01-22 LAB — CULTURE, BETA STREP (GROUP B ONLY): Strep Gp B Culture: NEGATIVE

## 2021-01-23 ENCOUNTER — Other Ambulatory Visit: Payer: Self-pay

## 2021-01-23 ENCOUNTER — Encounter (HOSPITAL_COMMUNITY): Payer: Self-pay | Admitting: Obstetrics and Gynecology

## 2021-01-23 ENCOUNTER — Inpatient Hospital Stay (HOSPITAL_COMMUNITY): Payer: BC Managed Care – PPO | Admitting: Anesthesiology

## 2021-01-23 ENCOUNTER — Inpatient Hospital Stay (HOSPITAL_COMMUNITY)
Admission: AD | Admit: 2021-01-23 | Discharge: 2021-01-27 | DRG: 807 | Disposition: A | Discharge status: Home / Self Care | Payer: BC Managed Care – PPO | Attending: Obstetrics & Gynecology | Admitting: Obstetrics & Gynecology

## 2021-01-23 ENCOUNTER — Inpatient Hospital Stay (HOSPITAL_COMMUNITY): Payer: BC Managed Care – PPO

## 2021-01-23 DIAGNOSIS — F419 Anxiety disorder, unspecified: Secondary | ICD-10-CM | POA: Diagnosis present

## 2021-01-23 DIAGNOSIS — O1002 Pre-existing essential hypertension complicating childbirth: Secondary | ICD-10-CM | POA: Diagnosis present

## 2021-01-23 DIAGNOSIS — Z23 Encounter for immunization: Secondary | ICD-10-CM

## 2021-01-23 DIAGNOSIS — D563 Thalassemia minor: Secondary | ICD-10-CM | POA: Diagnosis present

## 2021-01-23 DIAGNOSIS — O1092 Unspecified pre-existing hypertension complicating childbirth: Secondary | ICD-10-CM | POA: Diagnosis not present

## 2021-01-23 DIAGNOSIS — O99344 Other mental disorders complicating childbirth: Secondary | ICD-10-CM | POA: Diagnosis present

## 2021-01-23 DIAGNOSIS — Z7982 Long term (current) use of aspirin: Secondary | ICD-10-CM | POA: Diagnosis not present

## 2021-01-23 DIAGNOSIS — Z20822 Contact with and (suspected) exposure to covid-19: Secondary | ICD-10-CM | POA: Diagnosis present

## 2021-01-23 DIAGNOSIS — O114 Pre-existing hypertension with pre-eclampsia, complicating childbirth: Principal | ICD-10-CM | POA: Diagnosis present

## 2021-01-23 DIAGNOSIS — Z3A37 37 weeks gestation of pregnancy: Secondary | ICD-10-CM | POA: Diagnosis not present

## 2021-01-23 DIAGNOSIS — O99214 Obesity complicating childbirth: Secondary | ICD-10-CM | POA: Diagnosis present

## 2021-01-23 DIAGNOSIS — O0990 Supervision of high risk pregnancy, unspecified, unspecified trimester: Secondary | ICD-10-CM

## 2021-01-23 DIAGNOSIS — O119 Pre-existing hypertension with pre-eclampsia, unspecified trimester: Secondary | ICD-10-CM | POA: Diagnosis present

## 2021-01-23 DIAGNOSIS — F32A Depression, unspecified: Secondary | ICD-10-CM | POA: Diagnosis present

## 2021-01-23 DIAGNOSIS — Z6841 Body Mass Index (BMI) 40.0 and over, adult: Secondary | ICD-10-CM

## 2021-01-23 DIAGNOSIS — Z3490 Encounter for supervision of normal pregnancy, unspecified, unspecified trimester: Secondary | ICD-10-CM | POA: Diagnosis present

## 2021-01-23 LAB — COMPREHENSIVE METABOLIC PANEL
ALT: 25 U/L (ref 0–44)
AST: 23 U/L (ref 15–41)
Albumin: 2.8 g/dL — ABNORMAL LOW (ref 3.5–5.0)
Alkaline Phosphatase: 147 U/L — ABNORMAL HIGH (ref 38–126)
Anion gap: 8 (ref 5–15)
BUN: 5 mg/dL — ABNORMAL LOW (ref 6–20)
CO2: 19 mmol/L — ABNORMAL LOW (ref 22–32)
Calcium: 9.7 mg/dL (ref 8.9–10.3)
Chloride: 107 mmol/L (ref 98–111)
Creatinine, Ser: 0.67 mg/dL (ref 0.44–1.00)
GFR, Estimated: >60 mL/min (ref >60)
Glucose, Bld: 73 mg/dL (ref 70–99)
Potassium: 4.1 mmol/L (ref 3.5–5.1)
Sodium: 134 mmol/L — ABNORMAL LOW (ref 135–145)
Total Bilirubin: 0.5 mg/dL (ref 0.3–1.2)
Total Protein: 6.6 g/dL (ref 6.5–8.1)

## 2021-01-23 LAB — CBC
HCT: 37.4 % (ref 36.0–46.0)
HCT: 36.8 % (ref 36.0–46.0)
Hemoglobin: 12.1 g/dL (ref 12.0–15.0)
Hemoglobin: 11.6 g/dL — ABNORMAL LOW (ref 12.0–15.0)
MCH: 26.1 pg (ref 26.0–34.0)
MCH: 26 pg (ref 26.0–34.0)
MCHC: 32.4 g/dL (ref 30.0–36.0)
MCHC: 31.5 g/dL (ref 30.0–36.0)
MCV: 80.6 fL (ref 80.0–100.0)
MCV: 82.5 fL (ref 80.0–100.0)
Platelets: 335 10*3/uL (ref 150–400)
Platelets: 295 10*3/uL (ref 150–400)
RBC: 4.64 MIL/uL (ref 3.87–5.11)
RBC: 4.46 MIL/uL (ref 3.87–5.11)
RDW: 14.9 % (ref 11.5–15.5)
RDW: 15.1 % (ref 11.5–15.5)
WBC: 8.8 10*3/uL (ref 4.0–10.5)
WBC: 10.7 10*3/uL — ABNORMAL HIGH (ref 4.0–10.5)
nRBC: 0 % (ref 0.0–0.2)
nRBC: 0 % (ref 0.0–0.2)

## 2021-01-23 LAB — TYPE AND SCREEN
ABO/RH(D): O POS
Antibody Screen: NEGATIVE

## 2021-01-23 LAB — RESP PANEL BY RT-PCR (FLU A&B, COVID) ARPGX2
Influenza A by PCR: NEGATIVE
Influenza B by PCR: NEGATIVE
SARS Coronavirus 2 by RT PCR: NEGATIVE

## 2021-01-23 LAB — PROTEIN / CREATININE RATIO, URINE
Creatinine, Urine: 64.81 mg/dL
Total Protein, Urine: <6 mg/dL

## 2021-01-23 MED ORDER — OXYTOCIN BOLUS FROM INFUSION
333.0000 mL | Freq: Once | INTRAVENOUS | Status: AC
  Administered 2021-01-25: 333 mL via INTRAVENOUS

## 2021-01-23 MED ORDER — OXYCODONE-ACETAMINOPHEN 5-325 MG PO TABS: 1.0000 | ORAL_TABLET | ORAL | Status: DC | PRN

## 2021-01-23 MED ORDER — EPHEDRINE 5 MG/ML INJ: 10.0000 mg | INTRAVENOUS | Status: DC | PRN

## 2021-01-23 MED ORDER — ONDANSETRON HCL 4 MG/2ML IJ SOLN
4.0000 mg | Freq: Four times a day (QID) | INTRAMUSCULAR | Status: DC | PRN
  Administered 2021-01-23 – 2021-01-25 (×2): 4 mg via INTRAVENOUS
  Filled 2021-01-23 (×2): qty 2

## 2021-01-23 MED ORDER — LACTATED RINGERS IV SOLN: INTRAVENOUS | Status: DC

## 2021-01-23 MED ORDER — MISOPROSTOL 50MCG HALF TABLET
ORAL_TABLET | ORAL | Status: AC
  Filled 2021-01-23: qty 1

## 2021-01-23 MED ORDER — PHENYLEPHRINE 40 MCG/ML (10ML) SYRINGE FOR IV PUSH (FOR BLOOD PRESSURE SUPPORT): 80.0000 ug | PREFILLED_SYRINGE | INTRAVENOUS | Status: DC | PRN

## 2021-01-23 MED ORDER — ACETAMINOPHEN 325 MG PO TABS: 650.0000 mg | ORAL_TABLET | ORAL | Status: DC | PRN

## 2021-01-23 MED ORDER — LIDOCAINE HCL (PF) 1 % IJ SOLN: 30.0000 mL | INTRAMUSCULAR | Status: DC | PRN

## 2021-01-23 MED ORDER — OXYTOCIN-SODIUM CHLORIDE 30-0.9 UT/500ML-% IV SOLN
1.0000 m[IU]/min | INTRAVENOUS | Status: DC
  Administered 2021-01-23: 2 m[IU]/min via INTRAVENOUS
  Administered 2021-01-24: 4 m[IU]/min via INTRAVENOUS
  Filled 2021-01-23: qty 500

## 2021-01-23 MED ORDER — FENTANYL-BUPIVACAINE-NACL 0.5-0.125-0.9 MG/250ML-% EP SOLN
12.0000 mL/h | EPIDURAL | Status: DC | PRN
  Administered 2021-01-23 – 2021-01-25 (×3): 12 mL/h via EPIDURAL
  Filled 2021-01-23 (×3): qty 250

## 2021-01-23 MED ORDER — SOD CITRATE-CITRIC ACID 500-334 MG/5ML PO SOLN: 30.0000 mL | ORAL | Status: DC | PRN

## 2021-01-23 MED ORDER — LACTATED RINGERS IV SOLN: 500.0000 mL | Freq: Once | INTRAVENOUS | Status: DC

## 2021-01-23 MED ORDER — FENTANYL CITRATE (PF) 100 MCG/2ML IJ SOLN
100.0000 ug | INTRAMUSCULAR | Status: DC | PRN
  Administered 2021-01-23 (×3): 100 ug via INTRAVENOUS
  Filled 2021-01-23 (×3): qty 2

## 2021-01-23 MED ORDER — DIPHENHYDRAMINE HCL 50 MG/ML IJ SOLN: 12.5000 mg | INTRAMUSCULAR | Status: DC | PRN

## 2021-01-23 MED ORDER — OXYCODONE-ACETAMINOPHEN 5-325 MG PO TABS: 2.0000 | ORAL_TABLET | ORAL | Status: DC | PRN

## 2021-01-23 MED ORDER — LACTATED RINGERS IV SOLN
500.0000 mL | INTRAVENOUS | Status: DC | PRN
  Administered 2021-01-25: 1000 mL via INTRAVENOUS

## 2021-01-23 MED ORDER — TERBUTALINE SULFATE 1 MG/ML IJ SOLN: 0.2500 mg | Freq: Once | INTRAMUSCULAR | Status: DC | PRN

## 2021-01-23 MED ORDER — LIDOCAINE HCL (PF) 1 % IJ SOLN
INTRAMUSCULAR | Status: DC | PRN
  Administered 2021-01-23 (×2): 4 mL via EPIDURAL

## 2021-01-23 MED ORDER — SERTRALINE HCL 50 MG PO TABS
50.0000 mg | ORAL_TABLET | Freq: Every day | ORAL | Status: DC
  Administered 2021-01-24 – 2021-01-27 (×4): 50 mg via ORAL
  Filled 2021-01-23 (×5): qty 1

## 2021-01-23 MED ORDER — MISOPROSTOL 50MCG HALF TABLET
50.0000 ug | ORAL_TABLET | ORAL | Status: DC
  Administered 2021-01-23 (×2): 50 ug via BUCCAL
  Filled 2021-01-23: qty 1

## 2021-01-23 MED ORDER — NIFEDIPINE ER OSMOTIC RELEASE 60 MG PO TB24
90.0000 mg | ORAL_TABLET | Freq: Every day | ORAL | Status: DC
  Administered 2021-01-24 – 2021-01-27 (×4): 90 mg via ORAL
  Filled 2021-01-23 (×5): qty 1

## 2021-01-23 MED ORDER — OXYTOCIN-SODIUM CHLORIDE 30-0.9 UT/500ML-% IV SOLN
2.5000 [IU]/h | INTRAVENOUS | Status: DC
  Filled 2021-01-23: qty 500

## 2021-01-23 NOTE — Anesthesia Procedure Notes (Signed)
Epidural Patient location during procedure: OB Start time: 01/23/2021 11:18 PM End time: 01/23/2021 11:22 PM  Staffing Anesthesiologist: Kaylyn Layer, MD Performed: anesthesiologist   Preanesthetic Checklist Completed: patient identified, IV checked, risks and benefits discussed, monitors and equipment checked, pre-op evaluation and timeout performed  Epidural Patient position: sitting Prep: DuraPrep and site prepped and draped Patient monitoring: continuous pulse ox, blood pressure and heart rate Approach: midline Location: L3-L4 Injection technique: LOR air  Needle:  Needle type: Tuohy  Needle gauge: 17 G Needle length: 9 cm Needle insertion depth: 9 cm Catheter type: closed end flexible Catheter size: 19 Gauge Catheter at skin depth: 15 cm Test dose: negative and Other (1% lidocaine)  Assessment Events: blood not aspirated, injection not painful, no injection resistance, no paresthesia and negative IV test  Additional Notes Patient identified. Risks, benefits, and alternatives discussed with patient including but not limited to bleeding, infection, nerve damage, paralysis, failed block, incomplete pain control, headache, blood pressure changes, nausea, vomiting, reactions to medication, itching, and postpartum back pain. Confirmed with bedside nurse the patient's most recent platelet count. Confirmed with patient that they are not currently taking any anticoagulation, have any bleeding history, or any family history of bleeding disorders. Patient expressed understanding and wished to proceed. All questions were answered. Sterile technique was used throughout the entire procedure. Please see nursing notes for vital signs.   Crisp LOR on first pass. Test dose was given through epidural catheter and negative prior to continuing to dose epidural or start infusion. Warning signs of high block given to the patient including shortness of breath, tingling/numbness in hands,  complete motor block, or any concerning symptoms with instructions to call for help. Patient was given instructions on fall risk and not to get out of bed. All questions and concerns addressed with instructions to call with any issues or inadequate analgesia.  Reason for block:procedure for pain

## 2021-01-23 NOTE — Anesthesia Preprocedure Evaluation (Signed)
Anesthesia Evaluation  Patient identified by MRN, date of birth, ID band Patient awake    Reviewed: Allergy & Precautions, Patient's Chart, lab work & pertinent test results  History of Anesthesia Complications Negative for: history of anesthetic complications  Airway Mallampati: II  TM Distance: >3 FB Neck ROM: Full    Dental no notable dental hx.    Pulmonary neg pulmonary ROS,    Pulmonary exam normal        Cardiovascular hypertension, Pt. on medications Normal cardiovascular exam     Neuro/Psych  Headaches, negative psych ROS   GI/Hepatic negative GI ROS, Neg liver ROS,   Endo/Other  Morbid obesity (BMI 51)  Renal/GU negative Renal ROS  negative genitourinary   Musculoskeletal negative musculoskeletal ROS (+)   Abdominal   Peds  Hematology negative hematology ROS (+)   Anesthesia Other Findings Day of surgery medications reviewed with patient.  Reproductive/Obstetrics (+) Pregnancy                             Anesthesia Physical Anesthesia Plan  ASA: 3  Anesthesia Plan: Epidural   Post-op Pain Management:    Induction:   PONV Risk Score and Plan: Treatment may vary due to age or medical condition  Airway Management Planned: Natural Airway  Additional Equipment:   Intra-op Plan:   Post-operative Plan:   Informed Consent: I have reviewed the patients History and Physical, chart, labs and discussed the procedure including the risks, benefits and alternatives for the proposed anesthesia with the patient or authorized representative who has indicated his/her understanding and acceptance.       Plan Discussed with:   Anesthesia Plan Comments:         Anesthesia Quick Evaluation

## 2021-01-23 NOTE — H&P (Addendum)
OBSTETRIC ADMISSION HISTORY AND PHYSICAL  Theresa Patterson is a 25 y.o. female G2P0010 with IUP at [redacted]w[redacted]d by LMP presenting for IOL for [facility] w/ superimposed preeclampsia. She reports +FMs, No LOF, no VB, no blurry vision, headaches or peripheral edema, and RUQ pain.  She plans on breast feeding. She request Nexplanon for birth control. She received her prenatal care at  [facility].    Of note, patient was admitted to [facility] from  9/14 to 9/16 (at [redacted]w[redacted]d) with cHTN and headache with concern for [facility] (protein:cr on 9/14 was 3.41). She received Mg and BMZ x2 during that admission. Her headache resolved and her HELLP labs were normal and had well controlled BP with her home Procardia dose so she was discharged home. Since then she has had several MAU visits and her Procardia was increased to 60mg  and then subsequently to 90mg  daily (which is her current dose).  Dating: By LMP --->  Estimated Date of Delivery: 02/13/21  Sono:   @[redacted]w[redacted]d , CWD, normal anatomy, cephalic presentation, [id number], 19% EFW   Prenatal History/Complications: chronic hypertension with superimposed preeclampsia, alpha thalassemia silent carrier  Past Medical History: Past Medical History:  Diagnosis Date   Alpha thalassemia silent carrier 10/15/2020   Chronic headaches 09/30/2020   Hypertension    Morbid obesity ([place]) 08/20/2020   Polycystic ovary syndrome     Past Surgical History: Past Surgical History:  Procedure Laterality Date   NO PAST SURGERIES      Obstetrical History: OB History     Gravida  2   Para      Term      Preterm      AB  1   Living         SAB  1   IAB      Ectopic      Multiple      Live Births              Social History Social History   Socioeconomic History   Marital status: Single    Spouse name: Not on file   Number of children: Not on file   Years of education: Not on file   Highest education level: Not on file  Occupational History   Not on file  Tobacco  Use   Smoking status: Never   Smokeless tobacco: Never  Vaping Use   Vaping Use: Never used  Substance and Sexual Activity   Alcohol use: Not Currently    Comment: not since confirmed pregnancy   Drug use: Not Currently   Sexual activity: Yes    Partners: Male    Birth control/protection: None  Other Topics Concern   Not on file  Social History Narrative   Not on file   Social Determinants of Health   Financial Resource Strain: Not on file  Food Insecurity: Not on file  Transportation Needs: Not on file  Physical Activity: Not on file  Stress: Not on file  Social Connections: Not on file    Family History: Family History  Problem Relation Age of Onset   Obesity Mother    Obesity Maternal Grandmother     Allergies: No Known Allergies  Medications Prior to Admission  Medication Sig Dispense Refill Last Dose   acetaminophen (TYLENOL) 500 MG tablet Take 1,000 mg by mouth every 6 (six) hours as needed.      aspirin EC 81 MG tablet Take 1 tablet (81 mg total) by mouth daily. 60 tablet 2  metoCLOPramide (REGLAN) 10 MG tablet Take 1 tablet (10 mg total) by mouth every 8 (eight) hours as needed for nausea (or headache). (Patient not taking: Reported on 01/18/2021) 30 tablet 0    NIFEdipine (ADALAT CC) 90 MG 24 hr tablet Take 1 tablet (90 mg total) by mouth daily. 30 tablet 1    Prenatal Vit-Fe Fumarate-FA (PREPLUS) 27-1 MG TABS Take 1 tablet by mouth daily. 30 tablet 13    promethazine (PHENERGAN) 25 MG tablet Take 0.5-1 tablets (12.5-25 mg total) by mouth every 6 (six) hours as needed for nausea or vomiting. 30 tablet 0    sertraline (ZOLOFT) 50 MG tablet Take 1 tablet (50 mg total) by mouth daily. 30 tablet 1      Review of Systems   All systems reviewed and negative except as stated in HPI  Height 5\' 8"  (1.727 m), weight (!) 151 kg, last menstrual period 05/09/2020. General appearance: alert Lungs: clear to auscultation bilaterally Heart: regular rate and  rhythm Abdomen: soft, non-tender; bowel sounds normal Extremities: Homans sign is negative, no sign of DVT Presentation: cephalic by BSUS Fetal monitoringBaseline: 150 bpm, Variability: Good {> 6 bpm), Accelerations: has accels, none that are 15x15 at this time, and Decelerations: Absent Uterine activityNone     Prenatal labs: ABO, Rh: --/--/O POS (09/14 2124) Antibody: NEG (09/14 2124) Rubella: 4.76 (06/09 1049) RPR: Non Reactive (09/01 1210)  HBsAg: Negative (06/09 1049)  HIV: Non Reactive (09/01 1210)  GBS: Negative/-- (11/08 0913)  2 hr Glucola Third trimester normal  Genetic screening  silent carrier for alpha thalassemia, LR NIPS, AFP normal Anatomy 02[birth year]07-2004 normal  Prenatal Transfer Tool  Maternal Diabetes: No Genetic Screening: Normal Maternal Ultrasounds/Referrals: Normal Fetal Ultrasounds or other Referrals:  None Maternal Substance Abuse:  No Significant Maternal Medications:  Meds include: Zoloft Other: Procardia Significant Maternal Lab Results: Group B Strep negative  No results found for this or any previous visit (from the past 24 hour(s)).  Patient Active Problem List   Diagnosis Date Noted   BMI 45.0-49.9, adult (HCC) 12/14/2020   Vaginal discharge during pregnancy in third trimester 12/14/2020   Headache in pregnancy, antepartum, third trimester 11/25/2020   Alpha thalassemia silent carrier 10/15/2020   Chronic headaches 09/30/2020   Maternal morbid obesity, antepartum (HCC) 08/20/2020   Chronic hypertension with superimposed preeclampsia 08/20/2020   Morbid obesity (HCC) 08/20/2020   Supervision of high-risk pregnancy 08/12/2020    Assessment/Plan:  Theresa Patterson is a 25 y.o. G2P0010 at [redacted]w[redacted]d here for IOL for CHTN w/ superimposed preeclampsia (PCR 3.4 on 9/14--> 0.1 on 11/1)  #Labor:Presents for IOL for cHTN with SIPE (diagnosed on 9/14). Cervix closed, thick, high. Will start with Buccal Cytotec 10/14. At next check will assess for foley balloon  placement. Discussed all induction methods in detail with patient and answered questions.  #Pain: Plans epidural  #FWB: Cat I without 15x15 accels. Moderate variability.  #ID:  GBS negative #MOF: Breast #MOC: Nexplanon (BCBS - will get outpatient) #Circ:  Yes, if a boy  #cHTN with siPreE Admitted to [facility] for headaches and PCR elevated to 3.4 on 9/14. Since then PCR downtrended and on 11/1 was 0.1. Currently asymptomatic and on Procardia 90mg  outpatient (and took dose this morning) - Continue Procardia 90mg  (written for tomorrow morning since took today's dose) - recheck PCR at admission - f/up admission CMP and CBC  #Hx of anxiety/depression Patient on Zoloft 50mg  at home. Took dose this morning - Continue Zoloft  (written for tomorrow morning  since took today's dose prior to admission)  Archer Asa, Medical Student  01/23/2021, 11:00 AM  GME ATTESTATION:  I saw and evaluated the patient. I agree with the findings and the plan of care as documented in the student's note and have made all necessary edits to the history and plan.   Renard Matter, MD, MPH OB Fellow, [place] [facility] [place] 01/23/2021 12:36 PM

## 2021-01-23 NOTE — Progress Notes (Signed)
Labor Progress Note Theresa Patterson is a 25 y.o. G2P0010 at [redacted]w[redacted]d who presented for IOL due to [facility]  S: Doing well. Feeling more contraction pain since last check. Foley balloon now out. No concerns at this time.   O:  BP 137/85   Pulse 96   Temp 98.1 F (36.7 C) (Oral)   Resp 17   Ht 5\' 8"  (1.727 m)   Wt (!) 151 kg   LMP 05/09/2020   BMI 50.62 kg/m   EFM: Baseline 130 bpm, moderate variability, + accels, no decels   CVE: Dilation: 4.5 Effacement (%): 70 Station: -3 Presentation: Vertex Exam by:: Dr. [email or web address]   A&P: 25 y.o. G2P0010 [redacted]w[redacted]d   #Labor: Progressing well s/p Cytotec and foley balloon. Will start Pitocin 2x2 and reassess in 3-4 hours. Plan for AROM next check as able. #Pain: Planning for epidural shortly  #FWB: Cat 1  #GBS negative  #cHTN: Normal to mild range pressures. On Procardia 90 mg daily. No symptoms currently. Pre-E labs normal on admission. Will continue to monitor.  [redacted]w[redacted]d, MD 9:48 PM

## 2021-01-23 NOTE — Progress Notes (Addendum)
Theresa Patterson is a 25 y.o. G2P0010 at [redacted]w[redacted]d  admitted for induction of labor due to [facility] with siPreE by PCR of 3.4 on 9/14   Subjective: Reports starting to feel some contractions/tightening.   Objective: BP (!) 148/88   Pulse (!) 101   Temp 98.7 F (37.1 C) (Oral)   Resp 18   Ht 5\' 8"  (1.727 m)   Wt (!) 151 kg   LMP 05/09/2020   BMI 50.62 kg/m  No intake/output data recorded. No intake/output data recorded.  FHT:  FHR: 135 bpm, variability: moderate,  accelerations:  Abscent,  decelerations:  Absent UC:   irregular SVE:   Dilation: 1.5 Effacement (%): 40 Station: -2 Exam by:: [email or web address] MD  Labs: Lab Results  Component Value Date   WBC 8.8 01/23/2021   HGB 12.1 01/23/2021   HCT 37.4 01/23/2021   MCV 80.6 01/23/2021   PLT 335 01/23/2021    Assessment / Plan: Induction of labor due to cHTN with SIPE  Labor:  s/p Cytotec x1, now changed from closed to 1.5, Foley balloon placed during check. Will give buccal Cytotec 01/25/2021 #2.  Preeclampsia:   PCR at admission undetectable, last BP at 1634 mild range 148/88, prior to that all BP normotensive. Admission HELLP labs normal. Continue to monitor.  Fetal Wellbeing:  Category I Pain Control:  IV pain meds I/D:   GBS negative  01/23/2021, 4:38 PM

## 2021-01-24 LAB — RPR: RPR Ser Ql: NONREACTIVE

## 2021-01-24 MED ORDER — CALCIUM CARBONATE ANTACID 500 MG PO CHEW
1.0000 | CHEWABLE_TABLET | Freq: Two times a day (BID) | ORAL | Status: DC | PRN
  Administered 2021-01-24: 200 mg via ORAL
  Filled 2021-01-24: qty 1

## 2021-01-24 NOTE — Progress Notes (Signed)
Labor Progress Note Theresa Patterson is a 25 y.o. G2P0010 at [redacted]w[redacted]d presented for IOL due to [place].  S: Doing well. Comfortable with epidural. No concerns.   O:  BP 129/71   Pulse 100   Temp 98.1 F (36.7 C) (Oral)   Resp 17   Ht 5\' 8"  (1.727 m)   Wt (!) 151 kg   LMP 05/09/2020   BMI 50.62 kg/m   EFM: Baseline 125 bpm, moderate variability, + accels, no decels   CVE: Dilation: 5.5 Effacement (%): 80 Station: -3 Presentation: Vertex Exam by:: Dr. [email or web address]   A&P: 25 y.o. G2P0010 [redacted]w[redacted]d  #Labor: Progressing well. AROM performed this check with scant clear fluid. Will continue Pitocin and reassess in 3-4 hours.  #Pain: Epidural  #FWB: Cat 1  #GBS negative  #cHTN: BP normal to mild range. No symptoms. Will continue to monitor.  [redacted]w[redacted]d, MD 5:36 AM

## 2021-01-24 NOTE — Progress Notes (Signed)
RN at bedside to adjust [place] from [id number]

## 2021-01-24 NOTE — Progress Notes (Signed)
Theresa Patterson is a 25 y.o. G2P0010 at [redacted]w[redacted]d b admitted for IOL for cHTN (prev concern for SIPE but admission PCR normal)  Subjective: Reports feels some pressure in pelvis. Intermittent and not constant.  Objective: BP 120/65   Pulse (!) 109   Temp 97.8 F (36.6 C) (Oral)   Resp 20   Ht 5\' 8"  (1.727 m)   Wt (!) 151 kg   LMP 05/09/2020   BMI 50.62 kg/m  No intake/output data recorded. Total I/O In: -  Out: 1600 [Urine:1600]  FHT:  FHR: 130 bpm, variability: moderate,  accelerations:  Present,  decelerations:  Absent UC:   irregular, every 3-7 minutes SVE:   Dilation: 5.5 Effacement (%): 100 Station: -2 Exam by:: Dr [email or web address]  Labs: Lab Results  Component Value Date   WBC 10.7 (H) 01/23/2021   HGB 11.6 (L) 01/23/2021   HCT 36.8 01/23/2021   MCV 82.5 01/23/2021   PLT 295 01/23/2021    Assessment / Plan: Induction of labor due to cHTNB,  now s/p pitocin break (pitocin previously up to 24/hr and cervix unchanged at 5.5 cm since AROM). Cervix checked and unchanged at 5.5 cm.  Will restart pitocin at this time and uptitrate as needed until adequate contractions (has IUPC in place) Preeclampsia:   110s-130s/60s-70s Fetal Wellbeing:  Category I Pain Control:  Epidural I/D:   GBS neg    01/25/2021 01/24/2021, 3:12 PM

## 2021-01-24 NOTE — Progress Notes (Signed)
Labor Progress Note Theresa Patterson is a 25 y.o. G2P0010 at [redacted]w[redacted]d who presented for IOL due to [facility]. Previously had concern for SIPE however UPC on admission normal.   S: Doing well. Feeling tired. No concerns at this time.  O:  BP (!) 120/54   Pulse 97   Temp 98.2 F (36.8 C) (Oral)   Resp 16   Ht 5\' 8"  (1.727 m)   Wt (!) 151 kg   LMP 05/09/2020   BMI 50.62 kg/m   EFM: Baseline 130 bpm, min-mod variability, + accels, no decels  Toco: Contractions every 2-4 minutes, MVUs inadequate   CVE: Dilation: 5.5 Effacement (%): 100 Station: -1 Presentation: Vertex Exam by:: Dr. [email or web address]   A&P: 25 y.o. G2P0010 [redacted]w[redacted]d   #Labor: Small change in fetal station this check. Dilation and effacement unchanged. Pitocin at 24 milli-units/min after Pitocin break earlier today. Not yet adequate with contraction pattern. Will continue to titrate up on Pitocin. Will plan to reassess in 4-5 hours. #Pain: Epidural  #FWB: Cat 1 #GBS negative  #cHTN: Normal to mild range pressure. No symptoms. On Procardia 90 mg daily. Continue to monitor.   [redacted]w[redacted]d, MD 11:14 PM

## 2021-01-25 ENCOUNTER — Encounter (HOSPITAL_COMMUNITY): Payer: Self-pay | Admitting: Obstetrics and Gynecology

## 2021-01-25 DIAGNOSIS — O1092 Unspecified pre-existing hypertension complicating childbirth: Secondary | ICD-10-CM

## 2021-01-25 DIAGNOSIS — Z3A37 37 weeks gestation of pregnancy: Secondary | ICD-10-CM

## 2021-01-25 LAB — CBC
HCT: 34.5 % — ABNORMAL LOW (ref 36.0–46.0)
Hemoglobin: 10.9 g/dL — ABNORMAL LOW (ref 12.0–15.0)
MCH: 26.1 pg (ref 26.0–34.0)
MCHC: 31.6 g/dL (ref 30.0–36.0)
MCV: 82.7 fL (ref 80.0–100.0)
Platelets: 278 10*3/uL (ref 150–400)
RBC: 4.17 MIL/uL (ref 3.87–5.11)
RDW: 15.1 % (ref 11.5–15.5)
WBC: 16.5 10*3/uL — ABNORMAL HIGH (ref 4.0–10.5)
nRBC: 0 % (ref 0.0–0.2)

## 2021-01-25 MED ORDER — ONDANSETRON HCL 4 MG PO TABS: 4.0000 mg | ORAL_TABLET | ORAL | Status: DC | PRN

## 2021-01-25 MED ORDER — IBUPROFEN 600 MG PO TABS
600.0000 mg | ORAL_TABLET | Freq: Four times a day (QID) | ORAL | Status: DC
  Administered 2021-01-25 – 2021-01-27 (×7): 600 mg via ORAL
  Filled 2021-01-25 (×9): qty 1

## 2021-01-25 MED ORDER — ONDANSETRON HCL 4 MG/2ML IJ SOLN: 4.0000 mg | INTRAMUSCULAR | Status: DC | PRN

## 2021-01-25 MED ORDER — ACETAMINOPHEN 325 MG PO TABS
650.0000 mg | ORAL_TABLET | ORAL | Status: DC | PRN
  Administered 2021-01-25 – 2021-01-26 (×2): 650 mg via ORAL
  Filled 2021-01-25 (×2): qty 2

## 2021-01-25 MED ORDER — SENNOSIDES-DOCUSATE SODIUM 8.6-50 MG PO TABS
2.0000 | ORAL_TABLET | Freq: Every day | ORAL | Status: DC
  Administered 2021-01-26 – 2021-01-27 (×2): 2 via ORAL
  Filled 2021-01-25 (×2): qty 2

## 2021-01-25 MED ORDER — SIMETHICONE 80 MG PO CHEW
80.0000 mg | CHEWABLE_TABLET | ORAL | Status: DC | PRN
  Administered 2021-01-26: 80 mg via ORAL
  Filled 2021-01-25: qty 1

## 2021-01-25 MED ORDER — TETANUS-DIPHTH-ACELL PERTUSSIS 5-2.5-18.5 LF-MCG/0.5 IM SUSY
0.5000 mL | PREFILLED_SYRINGE | Freq: Once | INTRAMUSCULAR | Status: AC
  Administered 2021-01-26: 0.5 mL via INTRAMUSCULAR
  Filled 2021-01-25: qty 0.5

## 2021-01-25 MED ORDER — WITCH HAZEL-GLYCERIN EX PADS: 1.0000 "application " | MEDICATED_PAD | CUTANEOUS | Status: DC | PRN

## 2021-01-25 MED ORDER — DIPHENHYDRAMINE HCL 25 MG PO CAPS: 25.0000 mg | ORAL_CAPSULE | Freq: Four times a day (QID) | ORAL | Status: DC | PRN

## 2021-01-25 MED ORDER — BENZOCAINE-MENTHOL 20-0.5 % EX AERO
1.0000 "application " | INHALATION_SPRAY | CUTANEOUS | Status: DC | PRN
  Administered 2021-01-25: 1 via TOPICAL
  Filled 2021-01-25: qty 56

## 2021-01-25 MED ORDER — MEASLES, MUMPS & RUBELLA VAC IJ SOLR: 0.5000 mL | Freq: Once | INTRAMUSCULAR | Status: DC

## 2021-01-25 MED ORDER — DIBUCAINE (PERIANAL) 1 % EX OINT: 1.0000 "application " | TOPICAL_OINTMENT | CUTANEOUS | Status: DC | PRN

## 2021-01-25 MED ORDER — COCONUT OIL OIL: 1.0000 "application " | TOPICAL_OIL | Status: DC | PRN

## 2021-01-25 MED ORDER — PRENATAL MULTIVITAMIN CH
1.0000 | ORAL_TABLET | Freq: Every day | ORAL | Status: DC
  Administered 2021-01-25 – 2021-01-27 (×3): 1 via ORAL
  Filled 2021-01-25 (×3): qty 1

## 2021-01-25 NOTE — Discharge Summary (Signed)
Postpartum Discharge Summary      Patient Name: Theresa Patterson DOB: [birth year] MRN: [id number]  Date of admission: 01/23/2021 Delivery date:01/25/2021  Delivering provider: Genia Del  Date of discharge: 01/27/2021  Admitting diagnosis: Encounter for induction of labor [Z34.90] Intrauterine pregnancy: [redacted]w[redacted]d    Secondary diagnosis:  Principal Problem:   SVD (spontaneous vaginal delivery) Active Problems:   Supervision of high-risk pregnancy   Maternal morbid obesity, antepartum ([place]   Chronic hypertension with superimposed preeclampsia   Alpha thalassemia silent carrier   BMI 45.0-49.9, adult ([place]   Encounter for induction of labor  Additional problems: None   Discharge diagnosis: Term Pregnancy Delivered                                              Post partum procedures: None Augmentation: AROM, Pitocin, Cytotec, and IP Foley Complications: None  Hospital course: Induction of Labor With Vaginal Delivery   25y.o. yo G2P0010 at 319w2das admitted to the hospital 01/23/2021 for induction of labor.  Indication for induction:  Chronic hypertension .  Patient started her induction with Cytotec and foley balloon placement followed by Pitocin and AROM. She stayed at 5 cm dilation for multiple checks, therefore, an IUPC and FSE were placed for improved monitoring and Pitocin titration. She had one Pitocin break and was then restarted on Pitocin until she progressed to complete. She had an uncomplicated vaginal delivery. Membrane Rupture Time/Date: 5:22 AM ,01/24/2021   Delivery Method:Vaginal, Spontaneous  Episiotomy: None  Lacerations:  None  Details of delivery can be found in separate delivery note.  Patient had a routine postpartum course. BP was controlled on Procardia XL, no signs of preeclampsia. Patient is discharged home 01/27/21.  Newborn Data: Birth date:01/25/2021  Birth time:4:16 AM  Gender:Female  Living status:Living  Apgars:9 ,9  Weight:2860 g    Magnesium Sulfate received: No BMZ received: No Rhophylac: N/A MMR: N/A - Immune  T-DaP: Declined Flu: No Transfusion:No  Physical exam  Vitals:   01/26/21 0820 01/26/21 1944 01/26/21 2258 01/27/21 0432  BP: 137/84 (!) 125/56 127/63 (!) 104/55  Pulse: 97 93 89 87  Resp: '18 18 18 16  ' Temp: 98.3 F (36.8 C) 98.4 F (36.9 C) 98.3 F (36.8 C) 98.2 F (36.8 C)  TempSrc: Oral Oral Oral Oral  SpO2: 100% 100% 100% 98%  Weight:      Height:       General: alert, cooperative, and no distress Lochia: appropriate Uterine Fundus: firm Incision: N/A DVT Evaluation: No evidence of DVT seen on physical exam. Negative Homan's sign. No cords or calf tenderness.  Labs: Lab Results  Component Value Date   WBC 16.5 (H) 01/25/2021   HGB 10.9 (L) 01/25/2021   HCT 34.5 (L) 01/25/2021   MCV 82.7 01/25/2021   PLT 278 01/25/2021   CMP Latest Ref Rng & Units 01/23/2021  Glucose 70 - 99 mg/dL 73  BUN 6 - 20 mg/dL 5(L)  Creatinine 0.44 - 1.00 mg/dL 0.67  Sodium 135 - 145 mmol/L 134(L)  Potassium 3.5 - 5.1 mmol/L 4.1  Chloride 98 - 111 mmol/L 107  CO2 22 - 32 mmol/L 19(L)  Calcium 8.9 - 10.3 mg/dL 9.7  Total Protein 6.5 - 8.1 g/dL 6.6  Total Bilirubin 0.3 - 1.2 mg/dL 0.5  Alkaline Phos 38 - 126 U/L 147(H)  AST 15 - 41 U/L  23  ALT 0 - 44 U/L 25   Edinburgh Score: Edinburgh Postnatal Depression Scale Screening Tool 01/25/2021  I have been able to laugh and see the funny side of things. 0  I have looked forward with enjoyment to things. 0  I have blamed myself unnecessarily when things went wrong. 0  I have been anxious or worried for no good reason. 0  I have felt scared or panicky for no good reason. 0  Things have been getting on top of me. 1  I have been so unhappy that I have had difficulty sleeping. 0  I have felt sad or miserable. 0  I have been so unhappy that I have been crying. 0  The thought of harming myself has occurred to me. 0  Edinburgh Postnatal Depression Scale  Total 1     After visit meds:  Allergies as of 01/27/2021   No Known Allergies      Medication List     STOP taking these medications    acetaminophen 500 MG tablet Commonly known as: TYLENOL   aspirin EC 81 MG tablet   metoCLOPramide 10 MG tablet Commonly known as: REGLAN   promethazine 25 MG tablet Commonly known as: PHENERGAN       TAKE these medications    docusate sodium 100 MG capsule Commonly known as: COLACE Take 1 capsule (100 mg total) by mouth 2 (two) times daily as needed for mild constipation or moderate constipation.   ibuprofen 600 MG tablet Commonly known as: ADVIL Take 1 tablet (600 mg total) by mouth every 6 (six) hours as needed for mild pain, moderate pain or cramping.   NIFEdipine 90 MG 24 hr tablet Commonly known as: PROCARDIA XL/NIFEDICAL-XL Take 1 tablet (90 mg total) by mouth daily.   PrePLUS 27-1 MG Tabs Take 1 tablet by mouth daily.   sertraline 50 MG tablet Commonly known as: Zoloft Take 1 tablet (50 mg total) by mouth daily.         Discharge home in stable condition Infant Feeding: Breast Infant Disposition:NICU Discharge instruction: per After Visit Summary and Postpartum booklet. Activity: Advance as tolerated. Pelvic rest for 6 weeks.  Diet: routine diet Future Appointments: Future Appointments  Date Time Provider [place]  02/01/2021 11:00 AM [place] None  03/08/2021  2:10 PM Donnamae Jude, MD [place] None   Follow up Visit: Message sent to [place] by Dr. Gwenlyn Perking on 01/25/21.   Please schedule this patient for a In person postpartum visit in 6 weeks with the following provider: Any provider. Additional Postpartum F/U: BP check 1 week  High risk pregnancy complicated by: HTN Delivery mode:  Vaginal, Spontaneous  Anticipated Birth Control:  Nexplanon outpatient   01/27/2021 Verita Schneiders, MD

## 2021-01-25 NOTE — Lactation Note (Signed)
This note was copied from a baby's chart. Lactation Consultation Note  Patient Name: Theresa Patterson Today's Date: 01/25/2021   Age:27 hours  Spoke to [facility] care RN Olegario Messier and she confirmed that mother just wants to do formula and she's not interested in pumping at this point. Lactation services no longer needed for this family.   Cheri Ayotte S Seichi Kaufhold 01/25/2021, 9:10 AM

## 2021-01-26 NOTE — Clinical Social Work Maternal (Signed)
CLINICAL SOCIAL WORK MATERNAL/CHILD NOTE  Patient Details  Name: Theresa Patterson MRN: [id number] Date of Birth: 01/11/1996  Date:  01/26/2021  Clinical Social Worker Initiating Note:  Donney Caraveo, LCSW Date/Time: Initiated:  01/26/21/1510     Child's Name:  Theresa Patterson   Biological Parents:  Mother, Father (Father: Aaron Patterson)   Need for Interpreter:  None   Reason for Referral:  Behavioral Health Concerns, Other (Comment) (Infant's NICU admission)   Address:  [address] [place] [address]    Phone number:  [phone] (home)     Additional phone number:   Household Members/Support Persons (HM/SP):   Household Member/Support Person 1   HM/SP Name Relationship DOB or Age  HM/SP -1 Aaron Patterson FOB    HM/SP -2        HM/SP -3        HM/SP -4        HM/SP -5        HM/SP -6        HM/SP -7        HM/SP -8          Natural Supports (not living in the home):  Immediate Family   Professional Supports: Therapist   Employment: Full-time   Type of Work: LPN   Education:  Some College   Homebound arranged:    Financial Resources:  Private Insurance    Other Resources:      Cultural/Religious Considerations Which May Impact Care:    Strengths:  Ability to meet basic needs  , Pediatrician chosen, Understanding of illness, Home prepared for child     Psychotropic Medications:         Pediatrician:    [place] area  Pediatrician List:    [facility]  [place]    [place]    [place]    [place]    [place]      Pediatrician Fax Number:    Risk Factors/Current Problems:  Mental Health Concerns     Cognitive State:  Able to Concentrate  , Alert  , Insightful  , Linear Thinking  , Goal Oriented     Mood/Affect:  Calm  , Interested  , Comfortable  , Relaxed     CSW Assessment: CSW met with MOB at infant's bedside to complete psychosocial assessment. CSW introduced self and  explained role. MOB was welcoming, pleasant, open, and remained engaged during assessment. 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 MOB reported that they have all items needed to care for infant including a car seat and basinet. CSW inquired about MOB's support system aside from FOB, MOB reported that her family members are supports.  CSW inquired about MOB's mental health history. MOB reported that she experienced anxiety a few years back after her friend was murdered. CSW offered condolences. MOB reported that her anxiety started to resurface a month ago. MOB attributed her recent anxiety to panicking about delivery and being new to motherhood. MOB reported that she started on Zoloft and it has been helpful. MOB reported that she plans to continue taking the Zoloft. CSW inquired about how MOB was feeling emotionally after giving birth, MOB reported that she is feeling great. MOB denied any additional mental health history. MOB presented calm and did not demonstrate any acute mental health signs/symptoms. CSW assessed for safety, MOB denied SI, HI, and domestic violence.   CSW provided education regarding the baby blues period vs. perinatal   mood disorders, discussed treatment and gave resources for mental health follow up if concerns arise.  CSW recommends self-evaluation during the postpartum time period using the New Mom Checklist from Postpartum Progress and encouraged MOB to contact a medical professional if symptoms are noted at any time.    CSW provided review of Sudden Infant Death Syndrome (SIDS) precautions.    CSW and MOB discussed infant's NICU admission. CSW informed MOB about the NICU, what to expect and resources/supports available while infant is admitted to the NICU. MOB reported that she feels well informed about infant's care and denied any transportation barriers with visiting infant in the NICU. MOB denied any questions/concerns regarding the NICU.  CSW will continue  to offer resources/supports while infant is admitted to the NICU.    CSW Plan/Description:  Sudden Infant Death Syndrome (SIDS) Education, Perinatal Mood and Anxiety Disorder (PMADs) Education, Psychosocial Support and Ongoing Assessment of Needs, Other Patient/Family Education    Quintus Premo L Kyo Cocuzza, LCSW 01/26/2021, 3:14 PM 

## 2021-01-26 NOTE — Progress Notes (Signed)
   01/26/21 1000  Clinical Encounter Type  Visited With Patient and family together  Visit Type Spiritual support  Referral From Other (Comment)  Consult/Referral To Chaplain   Chaplain Tery Sanfilippo visited the patient. The patient's grandmother was at bedside. Donnajean Lopes also accompany family to NICU. Family requested prayer of healing for the baby. This note was prepared by Deneen Harts, M.Div..  For questions please contact by phone [phone].

## 2021-01-26 NOTE — Progress Notes (Signed)
Post Partum Day 1 Subjective: no complaints  Objective: Blood pressure 137/84, pulse 97, temperature 98.3 F (36.8 C), temperature source Oral, resp. rate 18, height 5\' 8"  (1.727 m), weight (!) 151 kg, last menstrual period 05/09/2020, SpO2 100 %.  Physical Exam:  General: alert, cooperative, and no distress Lochia: appropriate Uterine Fundus: firm DVT Evaluation: No evidence of DVT seen on physical exam.  Recent Labs    01/23/21 2245 01/25/21 0539  HGB 11.6* 10.9*  HCT 36.8 34.5*    Assessment/Plan: Breastfeeding   LOS: 3 days   01/27/21 01/26/2021, 10:33 AM

## 2021-01-26 NOTE — Anesthesia Postprocedure Evaluation (Signed)
Anesthesia Post Note  Patient: Theresa Patterson  Procedure(s) Performed: AN AD HOC LABOR EPIDURAL     Patient location during evaluation: Mother Baby Anesthesia Type: Epidural Level of consciousness: awake and alert Pain management: pain level controlled Vital Signs Assessment: post-procedure vital signs reviewed and stable Respiratory status: spontaneous breathing, nonlabored ventilation and respiratory function stable Cardiovascular status: stable Postop Assessment: no headache, no backache and epidural receding Anesthetic complications: no   No notable events documented.  Last Vitals:  Vitals:   01/25/21 2007 01/26/21 0529  BP: 132/78 118/79  Pulse: 99 91  Resp: 17 18  Temp: 36.4 C 36.4 C  SpO2: 98% 100%    Last Pain:  Vitals:   01/26/21 0529  TempSrc: Oral  PainSc:    Pain Goal:                   [facility]

## 2021-01-27 ENCOUNTER — Other Ambulatory Visit (HOSPITAL_COMMUNITY): Payer: Self-pay

## 2021-01-27 MED ORDER — NIFEDIPINE ER 90 MG PO TB24
90.0000 mg | ORAL_TABLET | Freq: Every day | ORAL | 1 refills | Status: DC
  Filled 2021-01-27: qty 30, 30d supply, fill #0

## 2021-01-27 MED ORDER — IBUPROFEN 600 MG PO TABS
600.0000 mg | ORAL_TABLET | Freq: Four times a day (QID) | ORAL | 2 refills | Status: DC | PRN
  Filled 2021-01-27: qty 60, 15d supply, fill #0

## 2021-01-27 MED ORDER — DOCUSATE SODIUM 100 MG PO CAPS
100.0000 mg | ORAL_CAPSULE | Freq: Two times a day (BID) | ORAL | 2 refills | Status: DC | PRN
  Filled 2021-01-27: qty 30, 15d supply, fill #0

## 2021-02-01 ENCOUNTER — Ambulatory Visit: Payer: BC Managed Care – PPO

## 2021-02-02 ENCOUNTER — Ambulatory Visit (INDEPENDENT_AMBULATORY_CARE_PROVIDER_SITE_OTHER): Payer: BC Managed Care – PPO | Admitting: *Deleted

## 2021-02-02 ENCOUNTER — Other Ambulatory Visit: Payer: Self-pay

## 2021-02-02 VITALS — BP 128/85 | HR 86 | Ht 68.0 in | Wt 319.0 lb

## 2021-02-02 DIAGNOSIS — O119 Pre-existing hypertension with pre-eclampsia, unspecified trimester: Secondary | ICD-10-CM

## 2021-02-02 NOTE — Progress Notes (Signed)
Subjective:  Theresa Patterson is a 25 y.o. female here for BP check.   Hypertension ROS: taking medications as instructed, no medication side effects noted, no TIA's, no chest pain on exertion, no dyspnea on exertion, and no swelling of ankles.    Objective:  LMP 05/09/2020   Appearance alert, well appearing, and in no distress, oriented to person, place, and time, and overweight. General exam BP noted to be well controlled today in office.    Assessment:   Blood Pressure well controlled.   Plan:  Current treatment plan is effective, no change in therapy. Follow up as scheduled for postpartum visit. Visit currently scheduled as e-visit. Advised patient to change to in person visit for BP check and management.

## 2021-02-07 ENCOUNTER — Telehealth (HOSPITAL_COMMUNITY): Payer: Self-pay | Admitting: *Deleted

## 2021-02-07 ENCOUNTER — Encounter: Payer: Self-pay | Admitting: Obstetrics & Gynecology

## 2021-02-07 NOTE — Telephone Encounter (Signed)
Phone voicemail message left to return nurse call.  Duffy Rhody, RN 02-07-2021 at 3:15pm

## 2021-03-07 ENCOUNTER — Encounter (HOSPITAL_COMMUNITY): Payer: Self-pay | Admitting: Obstetrics & Gynecology

## 2021-03-07 ENCOUNTER — Other Ambulatory Visit: Payer: Self-pay

## 2021-03-07 ENCOUNTER — Inpatient Hospital Stay (HOSPITAL_COMMUNITY)
Admission: AD | Admit: 2021-03-07 | Discharge: 2021-03-09 | DRG: 769 | Disposition: A | Discharge status: Home / Self Care | Payer: BC Managed Care – PPO | Attending: Surgery | Admitting: Surgery

## 2021-03-07 ENCOUNTER — Inpatient Hospital Stay (HOSPITAL_COMMUNITY): Payer: BC Managed Care – PPO

## 2021-03-07 DIAGNOSIS — Z8719 Personal history of other diseases of the digestive system: Secondary | ICD-10-CM

## 2021-03-07 DIAGNOSIS — K8050 Calculus of bile duct without cholangitis or cholecystitis without obstruction: Secondary | ICD-10-CM | POA: Diagnosis present

## 2021-03-07 DIAGNOSIS — Z419 Encounter for procedure for purposes other than remedying health state, unspecified: Secondary | ICD-10-CM

## 2021-03-07 DIAGNOSIS — Z20822 Contact with and (suspected) exposure to covid-19: Secondary | ICD-10-CM | POA: Diagnosis present

## 2021-03-07 DIAGNOSIS — O2663 Liver and biliary tract disorders in the puerperium: Principal | ICD-10-CM | POA: Diagnosis present

## 2021-03-07 DIAGNOSIS — K8020 Calculus of gallbladder without cholecystitis without obstruction: Secondary | ICD-10-CM

## 2021-03-07 DIAGNOSIS — O99215 Obesity complicating the puerperium: Secondary | ICD-10-CM | POA: Diagnosis present

## 2021-03-07 LAB — COMPREHENSIVE METABOLIC PANEL
ALT: 511 U/L — ABNORMAL HIGH (ref 0–44)
AST: 465 U/L — ABNORMAL HIGH (ref 15–41)
Albumin: 3.9 g/dL (ref 3.5–5.0)
Alkaline Phosphatase: 189 U/L — ABNORMAL HIGH (ref 38–126)
Anion gap: 8 (ref 5–15)
BUN: 9 mg/dL (ref 6–20)
CO2: 27 mmol/L (ref 22–32)
Calcium: 9.6 mg/dL (ref 8.9–10.3)
Chloride: 102 mmol/L (ref 98–111)
Creatinine, Ser: 1.01 mg/dL — ABNORMAL HIGH (ref 0.44–1.00)
GFR, Estimated: >60 mL/min (ref >60)
Glucose, Bld: 86 mg/dL (ref 70–99)
Potassium: 3.6 mmol/L (ref 3.5–5.1)
Sodium: 137 mmol/L (ref 135–145)
Total Bilirubin: 1.1 mg/dL (ref 0.3–1.2)
Total Protein: 7.6 g/dL (ref 6.5–8.1)

## 2021-03-07 LAB — CBC WITH DIFFERENTIAL/PLATELET
Abs Immature Granulocytes: 0.02 10*3/uL (ref 0.00–0.07)
Basophils Absolute: 0.1 10*3/uL (ref 0.0–0.1)
Basophils Relative: 1 %
Eosinophils Absolute: 0.1 10*3/uL (ref 0.0–0.5)
Eosinophils Relative: 1 %
HCT: 38.7 % (ref 36.0–46.0)
Hemoglobin: 12.2 g/dL (ref 12.0–15.0)
Immature Granulocytes: 0 %
Lymphocytes Relative: 39 %
Lymphs Abs: 2.9 10*3/uL (ref 0.7–4.0)
MCH: 26 pg (ref 26.0–34.0)
MCHC: 31.5 g/dL (ref 30.0–36.0)
MCV: 82.3 fL (ref 80.0–100.0)
Monocytes Absolute: 0.6 10*3/uL (ref 0.1–1.0)
Monocytes Relative: 8 %
Neutro Abs: 3.8 10*3/uL (ref 1.7–7.7)
Neutrophils Relative %: 51 %
Platelets: 343 10*3/uL (ref 150–400)
RBC: 4.7 MIL/uL (ref 3.87–5.11)
RDW: 14.9 % (ref 11.5–15.5)
WBC: 7.3 10*3/uL (ref 4.0–10.5)
nRBC: 0 % (ref 0.0–0.2)

## 2021-03-07 LAB — LIPASE, BLOOD: Lipase: 34 U/L (ref 11–51)

## 2021-03-07 LAB — AMYLASE: Amylase: 104 U/L — ABNORMAL HIGH (ref 28–100)

## 2021-03-07 MED ORDER — LACTATED RINGERS IV BOLUS
1000.0000 mL | Freq: Once | INTRAVENOUS | Status: AC
  Administered 2021-03-07: 21:00:00 1000 mL via INTRAVENOUS

## 2021-03-07 MED ORDER — SUCRALFATE 1 GM/10ML PO SUSP: 1.0000 g | Freq: Once | ORAL | Status: DC

## 2021-03-07 MED ORDER — ONDANSETRON HCL 4 MG/2ML IJ SOLN
4.0000 mg | Freq: Once | INTRAMUSCULAR | Status: AC
  Administered 2021-03-07: 21:00:00 4 mg via INTRAVENOUS
  Filled 2021-03-07: qty 2

## 2021-03-07 MED ORDER — FENTANYL CITRATE (PF) 100 MCG/2ML IJ SOLN
50.0000 ug | Freq: Once | INTRAMUSCULAR | Status: AC
  Administered 2021-03-07: 21:00:00 50 ug via INTRAVENOUS
  Filled 2021-03-07: qty 2

## 2021-03-07 NOTE — Consult Note (Addendum)
CC/Reason for consult: Choledocholithiasis  Requesting provider: Jamilla walker cnm  HPI: Theresa Patterson is an 25 y.o. female hx of HTN, PCOS, morbid obesity whom is s/p svd 01/25/21 with know issues regarding gallstones, presented to MAU with 26 hr hx of worsening MEG+RUQ pain radiating to back. Pain is constant and sharp. Associated nausea and vomiting - 5x last night, none today. No aggravating/alleviating factors. No fever or chills. Denies blood in her stool, black stool, recent constipation or diarrhea. No tylenol ingestion. No history of hepatitis.   She underwent workup in MAU -  WBC 7 AST/ALT 465/511 Tbili 1.1 Alk phos 189 Lipase 34  RUQ US shows gallstones, largest 5 mm. No gallbladder wall thickening. CBD 2.8 mm. No pericholecystic fluid  LFTs were noted to be normal at time of delivery and throughout pregnancy  Past Medical History:  Diagnosis Date   Alpha thalassemia silent carrier 10/15/2020   Chronic headaches 09/30/2020   Hypertension    Morbid obesity ([place]) 08/20/2020   Polycystic ovary syndrome     Past Surgical History:  Procedure Laterality Date   NO PAST SURGERIES      Family History  Problem Relation Age of Onset   Obesity Mother    Obesity Maternal Grandmother     Social:  reports that she has never smoked. She has never used smokeless tobacco. She reports that she does not currently use alcohol. She reports that she does not currently use drugs.  Allergies: No Known Allergies  Medications: I have reviewed the patient's current medications.  Results for orders placed or performed during the hospital encounter of 03/07/21 (from the past 48 hour(s))  CBC with Differential/Platelet     Status: None   Collection Time: 03/07/21  9:18 PM  Result Value Ref Range   WBC 7.3 4.0 - 10.5 K/uL   RBC 4.70 3.87 - 5.11 MIL/uL   Hemoglobin 12.2 12.0 - 15.0 g/dL   HCT 38.7 36.0 - 46.0 %   MCV 82.3 80.0 - 100.0 fL   MCH 26.0 26.0 - 34.0 pg   MCHC 31.5  30.0 - 36.0 g/dL   RDW 14.9 11.5 - 15.5 %   Platelets 343 150 - 400 K/uL   nRBC 0.0 0.0 - 0.2 %   Neutrophils Relative % 51 %   Neutro Abs 3.8 1.7 - 7.7 K/uL   Lymphocytes Relative 39 %   Lymphs Abs 2.9 0.7 - 4.0 K/uL   Monocytes Relative 8 %   Monocytes Absolute 0.6 0.1 - 1.0 K/uL   Eosinophils Relative 1 %   Eosinophils Absolute 0.1 0.0 - 0.5 K/uL   Basophils Relative 1 %   Basophils Absolute 0.1 0.0 - 0.1 K/uL   Immature Granulocytes 0 %   Abs Immature Granulocytes 0.02 0.00 - 0.07 K/uL    Comment: Performed at [facility], [address] [place], [place] [address]  Comprehensive metabolic panel     Status: Abnormal   Collection Time: 03/07/21  9:18 PM  Result Value Ref Range   Sodium 137 135 - 145 mmol/L   Potassium 3.6 3.5 - 5.1 mmol/L   Chloride 102 98 - 111 mmol/L   CO2 27 22 - 32 mmol/L   Glucose, Bld 86 70 - 99 mg/dL    Comment: Glucose reference range applies only to samples taken after fasting for at least 8 hours.   BUN 9 6 - 20 mg/dL   Creatinine, Ser 1.01 (H) 0.44 - 1.00 mg/dL   Calcium  9.6 8.9 - 10.3 mg/dL   Total Protein 7.6 6.5 - 8.1 g/dL   Albumin 3.9 3.5 - 5.0 g/dL   AST 465 (H) 15 - 41 U/L   ALT 511 (H) 0 - 44 U/L   Alkaline Phosphatase 189 (H) 38 - 126 U/L   Total Bilirubin 1.1 0.3 - 1.2 mg/dL   GFR, Estimated >60 >60 mL/min    Comment: (NOTE) Calculated using the CKD-EPI Creatinine Equation (2021)    Anion gap 8 5 - 15    Comment: Performed at [facility] [address], [place], [place] [address]  Lipase, blood     Status: None   Collection Time: 03/07/21  9:18 PM  Result Value Ref Range   Lipase 34 11 - 51 U/L    Comment: Performed at [facility] [address], [place], [place] [address]  Amylase     Status: Abnormal   Collection Time: 03/07/21  9:18 PM  Result Value Ref Range   Amylase 104 (H) 28 - 100 U/L    Comment: Performed at [facility], [place] [address], [place], [place] [address]    US Abdomen Limited  RUQ (LIVER/GB)  Result Date: 03/07/2021 CLINICAL DATA:  Right upper quadrant pain. EXAM: ULTRASOUND ABDOMEN LIMITED RIGHT UPPER QUADRANT COMPARISON:  None. FINDINGS: Gallbladder: Multiple small shadowing echogenic gallstones are seen within the gallbladder lumen. The largest measures approximately 5 mm. The gallbladder wall measures 2.6 mm in thickness. No sonographic Murphy sign noted by sonographer. Common bile duct: Diameter: 2.8 mm Liver: No focal lesion identified. Within normal limits in parenchymal echogenicity. Portal vein is patent on color Doppler imaging with normal direction of blood flow towards the liver. Other: None. IMPRESSION: Cholelithiasis, without acute cholecystitis. Electronically Signed   By: Virgina Norfolk M.D.   On: 03/07/2021 22:02    ROS - all of the below systems have been reviewed with the patient and positives are indicated with bold text General: chills, fever or night sweats Eyes: blurry vision or double vision ENT: epistaxis or sore throat Allergy/Immunology: itchy/watery eyes or nasal congestion Hematologic/Lymphatic: bleeding problems, blood clots or swollen lymph nodes Endocrine: temperature intolerance or unexpected weight changes Breast: new or changing breast lumps or nipple discharge Resp: cough, shortness of breath, or wheezing CV: chest pain or dyspnea on exertion GI: as per HPI GU: dysuria, trouble voiding, or hematuria MSK: joint pain or joint stiffness Neuro: TIA or stroke symptoms Derm: pruritus and skin lesion changes Psych: anxiety and depression  PE Blood pressure 127/70, pulse 79, temperature 98 F (36.7 C), temperature source Oral, resp. rate 17, height '5\' 8"'  (1.727 m), weight (!) 143.9 kg, SpO2 100 %, unknown if currently breastfeeding. Constitutional: NAD; conversant; no deformities; wearing mask Eyes: Moist conjunctiva; no lid lag; anicteric; PERRL Neck: Trachea midline; no thyromegaly Lungs: Normal respiratory effort; no tactile  fremitus CV: RRR; no palpable thrills; no pitting edema GI: abdomen is obese, soft, minimal MEG tenderness, negative Murphy's, no rebound nor guarding, nondistended; no palpable hepatosplenomegaly MSK: Normal range of motion of extremities; no clubbing/cyanosis Psychiatric: Appropriate affect; alert and oriented x3 Lymphatic: No palpable cervical or axillary lymphadenopathy  Results for orders placed or performed during the hospital encounter of 03/07/21 (from the past 48 hour(s))  CBC with Differential/Platelet     Status: None   Collection Time: 03/07/21  9:18 PM  Result Value Ref Range   WBC 7.3 4.0 - 10.5 K/uL   RBC 4.70 3.87 - 5.11  MIL/uL   Hemoglobin 12.2 12.0 - 15.0 g/dL   HCT 38.7 36.0 - 46.0 %   MCV 82.3 80.0 - 100.0 fL   MCH 26.0 26.0 - 34.0 pg   MCHC 31.5 30.0 - 36.0 g/dL   RDW 14.9 11.5 - 15.5 %   Platelets 343 150 - 400 K/uL   nRBC 0.0 0.0 - 0.2 %   Neutrophils Relative % 51 %   Neutro Abs 3.8 1.7 - 7.7 K/uL   Lymphocytes Relative 39 %   Lymphs Abs 2.9 0.7 - 4.0 K/uL   Monocytes Relative 8 %   Monocytes Absolute 0.6 0.1 - 1.0 K/uL   Eosinophils Relative 1 %   Eosinophils Absolute 0.1 0.0 - 0.5 K/uL   Basophils Relative 1 %   Basophils Absolute 0.1 0.0 - 0.1 K/uL   Immature Granulocytes 0 %   Abs Immature Granulocytes 0.02 0.00 - 0.07 K/uL    Comment: Performed at [facility] [address], [place], [place] [address]  Comprehensive metabolic panel     Status: Abnormal   Collection Time: 03/07/21  9:18 PM  Result Value Ref Range   Sodium 137 135 - 145 mmol/L   Potassium 3.6 3.5 - 5.1 mmol/L   Chloride 102 98 - 111 mmol/L   CO2 27 22 - 32 mmol/L   Glucose, Bld 86 70 - 99 mg/dL    Comment: Glucose reference range applies only to samples taken after fasting for at least 8 hours.   BUN 9 6 - 20 mg/dL   Creatinine, Ser 1.01 (H) 0.44 - 1.00 mg/dL   Calcium 9.6 8.9 - 10.3 mg/dL   Total Protein 7.6 6.5 - 8.1 g/dL   Albumin 3.9 3.5 - 5.0 g/dL   AST 465 (H)  15 - 41 U/L   ALT 511 (H) 0 - 44 U/L   Alkaline Phosphatase 189 (H) 38 - 126 U/L   Total Bilirubin 1.1 0.3 - 1.2 mg/dL   GFR, Estimated >60 >60 mL/min    Comment: (NOTE) Calculated using the CKD-EPI Creatinine Equation (2021)    Anion gap 8 5 - 15    Comment: Performed at [facility], [place] [address] [place], [place] [address]  Lipase, blood     Status: None   Collection Time: 03/07/21  9:18 PM  Result Value Ref Range   Lipase 34 11 - 51 U/L    Comment: Performed at [facility] [address], [place], [place] [address]  Amylase     Status: Abnormal   Collection Time: 03/07/21  9:18 PM  Result Value Ref Range   Amylase 104 (H) 28 - 100 U/L    Comment: Performed at [facility], [place] [address], [place], [place] [address]    US Abdomen Limited RUQ (LIVER/GB)  Result Date: 03/07/2021 CLINICAL DATA:  Right upper quadrant pain. EXAM: ULTRASOUND ABDOMEN LIMITED RIGHT UPPER QUADRANT COMPARISON:  None. FINDINGS: Gallbladder: Multiple small shadowing echogenic gallstones are seen within the gallbladder lumen. The largest measures approximately 5 mm. The gallbladder wall measures 2.6 mm in thickness. No sonographic Murphy sign noted by sonographer. Common bile duct: Diameter: 2.8 mm Liver: No focal lesion identified. Within normal limits in parenchymal echogenicity. Portal vein is patent on color Doppler imaging with normal direction of blood flow towards the liver. Other: None. IMPRESSION: Cholelithiasis, without acute cholecystitis. Electronically Signed   By: Virgina Norfolk M.D.   On: 03/07/2021 22:02    I have  personally reviewed the relevant RUQ [place], LFTs, lipase, CBC  A/P: Theresa Patterson is an 25 y.o. female with HTN, PCOS, morbid obesity with possible choledocholithiasis  -NPO after midnight tonight -The anatomy and physiology of the hepatobiliary system was discussed with the patient. The pathophysiology of gallbladder disease was discussed as well -Will plan  for admission to hospital -Repeat LFTs in AM -GI consult vs surgery in AM based on LFT trend and clinical course -Will likely plan cholecystectomy however prior to discharge to prevent this from happening again -We discussed that if surgery is in the morning, would be with one of my partners, Dr. Bobbye Morton, whom she would meet prior to surgery to go over details as well  Nadeen Landau, MD [facility], [place]

## 2021-03-07 NOTE — MAU Provider Note (Signed)
Chief Complaint:  Abdominal Pain and Back Pain   Event Date/Time   First Provider Initiated Contact with Patient 03/07/21 2028     HPI: Theresa Patterson is a 25 y.o. G2P1011 at [redacted]w[redacted]d postpartum who presents to maternity admissions reporting severe upper abdominal and mid-back pain since 9pm last night with nausea/vomiting. She has vomited 5x today. Has a history of gallstones while pregnant, has been trying to schedule her gallbladder removal but was told the [facility] is out of network for her Medicaid provider.  Denies vaginal bleeding or lower abdominal pain, fever or recent illness.  Pregnancy Course: Receives OB care at [facility] and had an initial consult with Dr. Sheliah Hatch at [facility] on 02/17/21.   Past Medical History:  Diagnosis Date   Alpha thalassemia silent carrier 10/15/2020   Chronic headaches 09/30/2020   Hypertension    Morbid obesity ([facility]) 08/20/2020   Polycystic ovary syndrome    OB History  Gravida Para Term Preterm AB Living  2 1 1   1 1   SAB IAB Ectopic Multiple Live Births  1       1    # Outcome Date GA Lbr Len/2nd Weight Sex Delivery Anes PTL Lv  2 Term 01/25/21 [redacted]w[redacted]d  6 lb 4.9 oz (2.86 kg) F Vag-Spont EPI  LIV  1 SAB            Past Surgical History:  Procedure Laterality Date   NO PAST SURGERIES     Family History  Problem Relation Age of Onset   Obesity Mother    Obesity Maternal Grandmother    Social History   Tobacco Use   Smoking status: Never   Smokeless tobacco: Never  Vaping Use   Vaping Use: Never used  Substance Use Topics   Alcohol use: Not Currently    Comment: not since confirmed pregnancy   Drug use: Not Currently   No Known Allergies Medications Prior to Admission  Medication Sig Dispense Refill Last Dose   busPIRone (BUSPAR) 10 MG tablet Take 10 mg by mouth 3 (three) times daily.   03/07/2021   escitalopram (LEXAPRO) 10 MG tablet Take 10 mg by mouth daily.   03/07/2021   ibuprofen (ADVIL)  600 MG tablet Take 1 tablet (600 mg total) by mouth every 6 (six) hours as needed for mild pain, moderate pain or cramping. 60 tablet 2 03/07/2021   NIFEdipine (ADALAT CC) 90 MG 24 hr tablet Take 1 tablet (90 mg total) by mouth daily. 30 tablet 1 03/07/2021   docusate sodium (COLACE) 100 MG capsule Take 1 capsule (100 mg total) by mouth 2 (two) times daily as needed for mild constipation or moderate constipation. 30 capsule 2    Prenatal Vit-Fe Fumarate-FA (PREPLUS) 27-1 MG TABS Take 1 tablet by mouth daily. 30 tablet 13    sertraline (ZOLOFT) 50 MG tablet Take 1 tablet (50 mg total) by mouth daily. 30 tablet 1    I have reviewed patient's Past Medical Hx, Surgical Hx, Family Hx, Social Hx, medications and allergies.   ROS:  Review of Systems  Constitutional:  Negative for chills, fatigue and fever.  HENT:  Negative for congestion and sore throat.   Eyes:  Negative for visual disturbance.  Respiratory:  Negative for cough and shortness of breath.   Cardiovascular:  Negative for chest pain.  Gastrointestinal:  Positive for abdominal pain (upper right quadrant), nausea and vomiting. Negative for diarrhea.  Genitourinary:  Negative for vaginal bleeding.  Musculoskeletal:  Positive for back pain (mid-back).  Neurological:  Negative for dizziness, syncope and headaches.   Physical Exam  Patient Vitals for the past 24 hrs:  BP Temp Temp src Pulse Resp SpO2 Height Weight  03/07/21 2003 127/70 98 F (36.7 C) Oral 79 17 100 % 5\' 8"  (1.727 m) (!) 317 lb 3.2 oz (143.9 kg)   Constitutional: Well-developed, well-nourished female in acute distress.  Cardiovascular: normal rate & rhythm Respiratory: normal effort GI: Abd soft, non-tender MS: Extremities nontender, no edema, normal ROM Neurologic: Alert and oriented x 4.  GU: no CVA tenderness Pelvic: exam deferred  Labs: Results for orders placed or performed during the hospital encounter of 03/07/21 (from the past 24 hour(s))  CBC with  Differential/Platelet     Status: None   Collection Time: 03/07/21  9:18 PM  Result Value Ref Range   WBC 7.3 4.0 - 10.5 K/uL   RBC 4.70 3.87 - 5.11 MIL/uL   Hemoglobin 12.2 12.0 - 15.0 g/dL   HCT 03/09/21 48.2 - 50.0 %   MCV 82.3 80.0 - 100.0 fL   MCH 26.0 26.0 - 34.0 pg   MCHC 31.5 30.0 - 36.0 g/dL   RDW 37.0 48.8 - 89.1 %   Platelets 343 150 - 400 K/uL   nRBC 0.0 0.0 - 0.2 %   Neutrophils Relative % 51 %   Neutro Abs 3.8 1.7 - 7.7 K/uL   Lymphocytes Relative 39 %   Lymphs Abs 2.9 0.7 - 4.0 K/uL   Monocytes Relative 8 %   Monocytes Absolute 0.6 0.1 - 1.0 K/uL   Eosinophils Relative 1 %   Eosinophils Absolute 0.1 0.0 - 0.5 K/uL   Basophils Relative 1 %   Basophils Absolute 0.1 0.0 - 0.1 K/uL   Immature Granulocytes 0 %   Abs Immature Granulocytes 0.02 0.00 - 0.07 K/uL  Comprehensive metabolic panel     Status: Abnormal   Collection Time: 03/07/21  9:18 PM  Result Value Ref Range   Sodium 137 135 - 145 mmol/L   Potassium 3.6 3.5 - 5.1 mmol/L   Chloride 102 98 - 111 mmol/L   CO2 27 22 - 32 mmol/L   Glucose, Bld 86 70 - 99 mg/dL   BUN 9 6 - 20 mg/dL   Creatinine, Ser 03/09/21 (H) 0.44 - 1.00 mg/dL   Calcium 9.6 8.9 - 5.03 mg/dL   Total Protein 7.6 6.5 - 8.1 g/dL   Albumin 3.9 3.5 - 5.0 g/dL   AST 88.8 (H) 15 - 41 U/L   ALT 511 (H) 0 - 44 U/L   Alkaline Phosphatase 189 (H) 38 - 126 U/L   Total Bilirubin 1.1 0.3 - 1.2 mg/dL   GFR, Estimated 280 >03 mL/min   Anion gap 8 5 - 15  Lipase, blood     Status: None   Collection Time: 03/07/21  9:18 PM  Result Value Ref Range   Lipase 34 11 - 51 U/L  Amylase     Status: Abnormal   Collection Time: 03/07/21  9:18 PM  Result Value Ref Range   Amylase 104 (H) 28 - 100 U/L    Imaging:  03/09/21 Abdomen Limited RUQ (LIVER/GB)  Result Date: 03/07/2021 CLINICAL DATA:  Right upper quadrant pain. EXAM: ULTRASOUND ABDOMEN LIMITED RIGHT UPPER QUADRANT COMPARISON:  None. FINDINGS: Gallbladder: Multiple small shadowing echogenic gallstones are seen  within the gallbladder lumen. The largest measures approximately 5 mm. The gallbladder wall measures 2.6 mm in thickness. No sonographic Murphy sign  noted by sonographer. Common bile duct: Diameter: 2.8 mm Liver: No focal lesion identified. Within normal limits in parenchymal echogenicity. Portal vein is patent on color Doppler imaging with normal direction of blood flow towards the liver. Other: None. IMPRESSION: Cholelithiasis, without acute cholecystitis. Electronically Signed   By: Aram Candela M.D.   On: 03/07/2021 22:02    MAU Course: Orders Placed This Encounter  Procedures   US Abdomen Limited RUQ (LIVER/GB)   CBC with Differential/Platelet   Comprehensive metabolic panel   Lipase, blood   Amylase   Diet NPO time specified   Informed Consent Details: Physician/Practitioner Attestation; Transcribe to consent form and obtain patient signature   Meds ordered this encounter  Medications   lactated ringers bolus 1,000 mL   ondansetron (ZOFRAN) injection 4 mg   fentaNYL (SUBLIMAZE) injection 50 mcg   DISCONTD: sucralfate (CARAFATE) 1 GM/10ML suspension 1 g   MDM: Nausea better with zofran and fluids, pain decreased somewhat with fentanyl. U/S showed cholelithiasis, labs showed elevated LFTs and amylase with normal bilirubin. Report called to general surgery (Dr. Cliffton Asters) who recommended admission to medicine for IV management of pain/nausea with trending of LFTs to decide on timing of surgical management. Report called to Dr. Debroah Loop and [facility] consulted. They reported Dr. Cliffton Asters will be putting in admission orders. Care turned over to hospitalist team.  Assessment: 1. Calculus of gallbladder without cholecystitis without obstruction   2. Personal history of gallstones    Plan: Care turned over to hospitalist team for admission to medicine unit.  Edd Arbour, CNM, MSN, IBCLC Certified Nurse Midwife, [facility]

## 2021-03-07 NOTE — MAU Note (Signed)
..  Theresa Patterson is a 25 y.o. post vaginal delivery on 01/25/2021 here in MAU reporting: Since 9pm last night she has had upper abdominal pain and mid-back pain, the pain is sharp and constant. She vomited 5 times today. She was diagnosed with gallstones during her pregnancy.   Pain score: 10/10 Vitals:   03/07/21 2003  BP: 127/70  Pulse: 79  Resp: 17  Temp: 98 F (36.7 C)  SpO2: 100%

## 2021-03-08 ENCOUNTER — Encounter (HOSPITAL_COMMUNITY): Admission: AD | Disposition: A | Discharge status: Home / Self Care | Payer: Self-pay | Source: Home / Self Care

## 2021-03-08 ENCOUNTER — Inpatient Hospital Stay (HOSPITAL_COMMUNITY): Payer: BC Managed Care – PPO | Admitting: Anesthesiology

## 2021-03-08 ENCOUNTER — Ambulatory Visit: Payer: BC Managed Care – PPO | Admitting: Obstetrics and Gynecology

## 2021-03-08 ENCOUNTER — Other Ambulatory Visit (HOSPITAL_COMMUNITY): Payer: Self-pay

## 2021-03-08 ENCOUNTER — Ambulatory Visit: Payer: BC Managed Care – PPO | Admitting: Family Medicine

## 2021-03-08 ENCOUNTER — Encounter (HOSPITAL_COMMUNITY): Payer: Self-pay

## 2021-03-08 DIAGNOSIS — K8050 Calculus of bile duct without cholangitis or cholecystitis without obstruction: Secondary | ICD-10-CM | POA: Diagnosis present

## 2021-03-08 DIAGNOSIS — Z20822 Contact with and (suspected) exposure to covid-19: Secondary | ICD-10-CM | POA: Diagnosis present

## 2021-03-08 DIAGNOSIS — O2663 Liver and biliary tract disorders in the puerperium: Secondary | ICD-10-CM | POA: Diagnosis present

## 2021-03-08 DIAGNOSIS — O99215 Obesity complicating the puerperium: Secondary | ICD-10-CM | POA: Diagnosis present

## 2021-03-08 HISTORY — PX: CHOLECYSTECTOMY: SHX55

## 2021-03-08 LAB — RESP PANEL BY RT-PCR (FLU A&B, COVID) ARPGX2
Influenza A by PCR: NEGATIVE
Influenza B by PCR: NEGATIVE
SARS Coronavirus 2 by RT PCR: NEGATIVE

## 2021-03-08 LAB — HEPATIC FUNCTION PANEL
ALT: 338 U/L — ABNORMAL HIGH (ref 0–44)
AST: 213 U/L — ABNORMAL HIGH (ref 15–41)
Albumin: 3.1 g/dL — ABNORMAL LOW (ref 3.5–5.0)
Alkaline Phosphatase: 147 U/L — ABNORMAL HIGH (ref 38–126)
Bilirubin, Direct: 0.2 mg/dL (ref 0.0–0.2)
Indirect Bilirubin: 0.6 mg/dL (ref 0.3–0.9)
Total Bilirubin: 0.8 mg/dL (ref 0.3–1.2)
Total Protein: 6 g/dL — ABNORMAL LOW (ref 6.5–8.1)

## 2021-03-08 LAB — POCT PREGNANCY, URINE: Preg Test, Ur: NEGATIVE

## 2021-03-08 SURGERY — LAPAROSCOPIC CHOLECYSTECTOMY
Anesthesia: General | Site: Abdomen

## 2021-03-08 MED ORDER — HYDRALAZINE HCL 20 MG/ML IJ SOLN: 10.0000 mg | INTRAMUSCULAR | Status: DC | PRN

## 2021-03-08 MED ORDER — ACETAMINOPHEN 10 MG/ML IV SOLN
INTRAVENOUS | Status: AC
  Filled 2021-03-08: qty 100

## 2021-03-08 MED ORDER — SIMETHICONE 80 MG PO CHEW: 40.0000 mg | CHEWABLE_TABLET | Freq: Four times a day (QID) | ORAL | Status: DC | PRN

## 2021-03-08 MED ORDER — HYDROMORPHONE HCL 1 MG/ML IJ SOLN: 0.2500 mg | INTRAMUSCULAR | Status: DC | PRN

## 2021-03-08 MED ORDER — BUPIVACAINE-EPINEPHRINE (PF) 0.25% -1:200000 IJ SOLN
INTRAMUSCULAR | Status: AC
  Filled 2021-03-08: qty 30

## 2021-03-08 MED ORDER — PROPOFOL 10 MG/ML IV BOLUS
INTRAVENOUS | Status: AC
  Filled 2021-03-08: qty 20

## 2021-03-08 MED ORDER — ONDANSETRON HCL 4 MG/2ML IJ SOLN
INTRAMUSCULAR | Status: DC | PRN
  Administered 2021-03-08: 4 mg via INTRAVENOUS

## 2021-03-08 MED ORDER — DIPHENHYDRAMINE HCL 12.5 MG/5ML PO ELIX: 12.5000 mg | ORAL_SOLUTION | Freq: Four times a day (QID) | ORAL | Status: DC | PRN

## 2021-03-08 MED ORDER — ENOXAPARIN SODIUM 80 MG/0.8ML IJ SOSY
70.0000 mg | PREFILLED_SYRINGE | INTRAMUSCULAR | Status: DC
  Administered 2021-03-08: 03:00:00 70 mg via SUBCUTANEOUS
  Filled 2021-03-08: qty 0.8

## 2021-03-08 MED ORDER — PRENATAL MULTIVITAMIN CH: 1.0000 | ORAL_TABLET | Freq: Every day | ORAL | Status: DC

## 2021-03-08 MED ORDER — NIFEDIPINE ER OSMOTIC RELEASE 60 MG PO TB24
90.0000 mg | ORAL_TABLET | Freq: Every day | ORAL | Status: DC
  Administered 2021-03-08: 16:00:00 90 mg via ORAL
  Filled 2021-03-08: qty 1

## 2021-03-08 MED ORDER — CEFAZOLIN IN SODIUM CHLORIDE 3-0.9 GM/100ML-% IV SOLN
3.0000 g | Freq: Once | INTRAVENOUS | Status: AC
  Administered 2021-03-08: 09:00:00 3 g via INTRAVENOUS
  Filled 2021-03-08: qty 100

## 2021-03-08 MED ORDER — ACETAMINOPHEN 500 MG PO TABS: 1000.0000 mg | ORAL_TABLET | Freq: Four times a day (QID) | ORAL | Status: DC

## 2021-03-08 MED ORDER — ONDANSETRON HCL 4 MG/2ML IJ SOLN: 4.0000 mg | Freq: Once | INTRAMUSCULAR | Status: DC | PRN

## 2021-03-08 MED ORDER — METHOCARBAMOL 750 MG PO TABS
750.0000 mg | ORAL_TABLET | Freq: Four times a day (QID) | ORAL | 1 refills | Status: DC
  Filled 2021-03-08: qty 120, 30d supply, fill #0

## 2021-03-08 MED ORDER — IBUPROFEN 600 MG PO TABS: 600.0000 mg | ORAL_TABLET | Freq: Four times a day (QID) | ORAL | Status: DC | PRN

## 2021-03-08 MED ORDER — DEXAMETHASONE SODIUM PHOSPHATE 10 MG/ML IJ SOLN
INTRAMUSCULAR | Status: DC | PRN
  Administered 2021-03-08: 10 mg via INTRAVENOUS

## 2021-03-08 MED ORDER — BUSPIRONE HCL 10 MG PO TABS
10.0000 mg | ORAL_TABLET | Freq: Three times a day (TID) | ORAL | Status: DC
  Filled 2021-03-08: qty 1

## 2021-03-08 MED ORDER — DEXAMETHASONE SODIUM PHOSPHATE 10 MG/ML IJ SOLN
INTRAMUSCULAR | Status: AC
  Filled 2021-03-08: qty 1

## 2021-03-08 MED ORDER — LIDOCAINE 2% (20 MG/ML) 5 ML SYRINGE
INTRAMUSCULAR | Status: DC | PRN
  Administered 2021-03-08: 100 mg via INTRAVENOUS

## 2021-03-08 MED ORDER — ESCITALOPRAM OXALATE 10 MG PO TABS: 10.0000 mg | ORAL_TABLET | Freq: Every day | ORAL | Status: DC

## 2021-03-08 MED ORDER — METHOCARBAMOL 500 MG PO TABS
1000.0000 mg | ORAL_TABLET | Freq: Three times a day (TID) | ORAL | Status: DC
  Administered 2021-03-08 – 2021-03-09 (×3): 1000 mg via ORAL
  Filled 2021-03-08 (×5): qty 2

## 2021-03-08 MED ORDER — ONDANSETRON HCL 4 MG/2ML IJ SOLN: 4.0000 mg | Freq: Four times a day (QID) | INTRAMUSCULAR | Status: DC | PRN

## 2021-03-08 MED ORDER — BUSPIRONE HCL 10 MG PO TABS
10.0000 mg | ORAL_TABLET | Freq: Three times a day (TID) | ORAL | Status: DC
  Administered 2021-03-08 (×2): 10 mg via ORAL
  Filled 2021-03-08 (×3): qty 1

## 2021-03-08 MED ORDER — ACETAMINOPHEN 500 MG PO TABS
1000.0000 mg | ORAL_TABLET | Freq: Four times a day (QID) | ORAL | Status: DC
  Administered 2021-03-08 – 2021-03-09 (×4): 1000 mg via ORAL
  Filled 2021-03-08 (×4): qty 2

## 2021-03-08 MED ORDER — TRAMADOL HCL 50 MG PO TABS: 50.0000 mg | ORAL_TABLET | Freq: Four times a day (QID) | ORAL | Status: DC | PRN

## 2021-03-08 MED ORDER — LACTATED RINGERS IV SOLN: INTRAVENOUS | Status: DC

## 2021-03-08 MED ORDER — DOCUSATE SODIUM 100 MG PO CAPS: 100.0000 mg | ORAL_CAPSULE | Freq: Two times a day (BID) | ORAL | Status: DC | PRN

## 2021-03-08 MED ORDER — SERTRALINE HCL 50 MG PO TABS
50.0000 mg | ORAL_TABLET | Freq: Every day | ORAL | Status: DC
  Administered 2021-03-08: 16:00:00 50 mg via ORAL
  Filled 2021-03-08: qty 1

## 2021-03-08 MED ORDER — CHLORHEXIDINE GLUCONATE 0.12 % MT SOLN: 15.0000 mL | Freq: Once | OROMUCOSAL | Status: AC

## 2021-03-08 MED ORDER — BUPIVACAINE-EPINEPHRINE (PF) 0.25% -1:200000 IJ SOLN
INTRAMUSCULAR | Status: DC | PRN
  Administered 2021-03-08: 30 mL

## 2021-03-08 MED ORDER — ROCURONIUM BROMIDE 10 MG/ML (PF) SYRINGE
PREFILLED_SYRINGE | INTRAVENOUS | Status: DC | PRN
  Administered 2021-03-08: 50 mg via INTRAVENOUS
  Administered 2021-03-08: 10 mg via INTRAVENOUS
  Administered 2021-03-08: 20 mg via INTRAVENOUS

## 2021-03-08 MED ORDER — MIDAZOLAM HCL 2 MG/2ML IJ SOLN
INTRAMUSCULAR | Status: DC | PRN
  Administered 2021-03-08: 2 mg via INTRAVENOUS

## 2021-03-08 MED ORDER — DOCUSATE SODIUM 100 MG PO CAPS
100.0000 mg | ORAL_CAPSULE | Freq: Two times a day (BID) | ORAL | 2 refills | Status: AC
  Filled 2021-03-08: qty 60, 30d supply, fill #0

## 2021-03-08 MED ORDER — MIDAZOLAM HCL 2 MG/2ML IJ SOLN
INTRAMUSCULAR | Status: AC
  Filled 2021-03-08: qty 2

## 2021-03-08 MED ORDER — OXYCODONE HCL 5 MG/5ML PO SOLN
5.0000 mg | ORAL | Status: DC | PRN
  Administered 2021-03-08: 11:00:00 5 mg via ORAL
  Administered 2021-03-08: 19:00:00 10 mg via ORAL
  Filled 2021-03-08: qty 5
  Filled 2021-03-08: qty 10

## 2021-03-08 MED ORDER — CHLORHEXIDINE GLUCONATE 0.12 % MT SOLN
OROMUCOSAL | Status: AC
  Administered 2021-03-08: 08:00:00 15 mL via OROMUCOSAL
  Filled 2021-03-08: qty 15

## 2021-03-08 MED ORDER — ONDANSETRON 4 MG PO TBDP: 4.0000 mg | ORAL_TABLET | Freq: Four times a day (QID) | ORAL | Status: DC | PRN

## 2021-03-08 MED ORDER — SUGAMMADEX SODIUM 500 MG/5ML IV SOLN
INTRAVENOUS | Status: AC
  Filled 2021-03-08: qty 5

## 2021-03-08 MED ORDER — ORAL CARE MOUTH RINSE: 15.0000 mL | Freq: Once | OROMUCOSAL | Status: AC

## 2021-03-08 MED ORDER — ESCITALOPRAM OXALATE 10 MG PO TABS
10.0000 mg | ORAL_TABLET | Freq: Every day | ORAL | Status: DC
  Administered 2021-03-08: 16:00:00 10 mg via ORAL
  Filled 2021-03-08: qty 1

## 2021-03-08 MED ORDER — HYDROMORPHONE HCL 1 MG/ML IJ SOLN: 0.5000 mg | INTRAMUSCULAR | Status: DC | PRN

## 2021-03-08 MED ORDER — FENTANYL CITRATE (PF) 250 MCG/5ML IJ SOLN
INTRAMUSCULAR | Status: DC | PRN
  Administered 2021-03-08: 50 ug via INTRAVENOUS
  Administered 2021-03-08: 150 ug via INTRAVENOUS
  Administered 2021-03-08: 50 ug via INTRAVENOUS

## 2021-03-08 MED ORDER — SERTRALINE HCL 50 MG PO TABS: 50.0000 mg | ORAL_TABLET | Freq: Every day | ORAL | Status: DC

## 2021-03-08 MED ORDER — ONDANSETRON HCL 4 MG/2ML IJ SOLN
INTRAMUSCULAR | Status: AC
  Filled 2021-03-08: qty 2

## 2021-03-08 MED ORDER — SCOPOLAMINE 1 MG/3DAYS TD PT72
MEDICATED_PATCH | TRANSDERMAL | Status: AC
  Filled 2021-03-08: qty 1

## 2021-03-08 MED ORDER — PROPOFOL 10 MG/ML IV BOLUS
INTRAVENOUS | Status: DC | PRN
  Administered 2021-03-08: 200 mg via INTRAVENOUS

## 2021-03-08 MED ORDER — SUGAMMADEX SODIUM 200 MG/2ML IV SOLN
INTRAVENOUS | Status: DC | PRN
  Administered 2021-03-08: 500 mg via INTRAVENOUS

## 2021-03-08 MED ORDER — OXYCODONE HCL 5 MG PO TABS
5.0000 mg | ORAL_TABLET | ORAL | 0 refills | Status: AC | PRN
  Filled 2021-03-08: qty 30, 5d supply, fill #0

## 2021-03-08 MED ORDER — MELATONIN 3 MG PO TABS
3.0000 mg | ORAL_TABLET | Freq: Every evening | ORAL | Status: DC | PRN
  Filled 2021-03-08: qty 1

## 2021-03-08 MED ORDER — NIFEDIPINE ER OSMOTIC RELEASE 60 MG PO TB24: 90.0000 mg | ORAL_TABLET | Freq: Every day | ORAL | Status: DC

## 2021-03-08 MED ORDER — METHOCARBAMOL 500 MG PO TABS
500.0000 mg | ORAL_TABLET | Freq: Four times a day (QID) | ORAL | Status: DC | PRN
  Filled 2021-03-08: qty 1

## 2021-03-08 MED ORDER — DIPHENHYDRAMINE HCL 50 MG/ML IJ SOLN: 12.5000 mg | Freq: Four times a day (QID) | INTRAMUSCULAR | Status: DC | PRN

## 2021-03-08 MED ORDER — SCOPOLAMINE 1 MG/3DAYS TD PT72
MEDICATED_PATCH | TRANSDERMAL | Status: DC | PRN
  Administered 2021-03-08: 1 mg via TRANSDERMAL

## 2021-03-08 MED ORDER — IBUPROFEN 600 MG PO TABS
600.0000 mg | ORAL_TABLET | Freq: Four times a day (QID) | ORAL | 1 refills | Status: AC
  Filled 2021-03-08: qty 120, 30d supply, fill #0

## 2021-03-08 MED ORDER — FENTANYL CITRATE (PF) 250 MCG/5ML IJ SOLN
INTRAMUSCULAR | Status: AC
  Filled 2021-03-08: qty 5

## 2021-03-08 MED ORDER — SUCCINYLCHOLINE CHLORIDE 200 MG/10ML IV SOSY
PREFILLED_SYRINGE | INTRAVENOUS | Status: DC | PRN
  Administered 2021-03-08: 180 mg via INTRAVENOUS

## 2021-03-08 SURGICAL SUPPLY — 42 items
APPLIER CLIP 5 13 M/L LIGAMAX5 (MISCELLANEOUS) ×4
BAG COUNTER SPONGE SURGICOUNT (BAG) ×3 IMPLANT
BAG SURGICOUNT SPONGE COUNTING (BAG) ×1
BLADE CLIPPER SURG (BLADE) ×3 IMPLANT
CANISTER SUCT 3000ML PPV (MISCELLANEOUS) ×4 IMPLANT
CHLORAPREP W/TINT 26 (MISCELLANEOUS) ×4 IMPLANT
CLIP APPLIE 5 13 M/L LIGAMAX5 (MISCELLANEOUS) ×2 IMPLANT
COVER MAYO STAND STRL (DRAPES) IMPLANT
COVER SURGICAL LIGHT HANDLE (MISCELLANEOUS) ×4 IMPLANT
DERMABOND ADVANCED (GAUZE/BANDAGES/DRESSINGS) ×2
DERMABOND ADVANCED .7 DNX12 (GAUZE/BANDAGES/DRESSINGS) ×2 IMPLANT
DISSECTOR BLUNT TIP ENDO 5MM (MISCELLANEOUS) ×4 IMPLANT
DRAPE C-ARM 42X120 X-RAY (DRAPES) IMPLANT
ELECT CAUTERY BLADE 6.4 (BLADE) ×3 IMPLANT
ELECT REM PT RETURN 9FT ADLT (ELECTROSURGICAL) ×4
ELECTRODE REM PT RTRN 9FT ADLT (ELECTROSURGICAL) ×2 IMPLANT
GLOVE SURG ENC MOIS LTX SZ6.5 (GLOVE) ×4 IMPLANT
GLOVE SURG UNDER POLY LF SZ6 (GLOVE) ×4 IMPLANT
GOWN STRL REUS W/ TWL LRG LVL3 (GOWN DISPOSABLE) ×6 IMPLANT
GOWN STRL REUS W/TWL LRG LVL3 (GOWN DISPOSABLE) ×6
KIT BASIN OR (CUSTOM PROCEDURE TRAY) ×4 IMPLANT
KIT TURNOVER KIT B (KITS) ×4 IMPLANT
NS IRRIG 1000ML POUR BTL (IV SOLUTION) ×4 IMPLANT
PAD ARMBOARD 7.5X6 YLW CONV (MISCELLANEOUS) ×4 IMPLANT
PENCIL BUTTON HOLSTER BLD 10FT (ELECTRODE) ×3 IMPLANT
POUCH SPECIMEN RETRIEVAL 10MM (ENDOMECHANICALS) IMPLANT
SCISSORS LAP 5X35 DISP (ENDOMECHANICALS) ×4 IMPLANT
SET CHOLANGIOGRAPH 5 50 .035 (SET/KITS/TRAYS/PACK) IMPLANT
SET IRRIG TUBING LAPAROSCOPIC (IRRIGATION / IRRIGATOR) IMPLANT
SET TUBE SMOKE EVAC HIGH FLOW (TUBING) ×4 IMPLANT
SLEEVE ENDOPATH XCEL 5M (ENDOMECHANICALS) ×8 IMPLANT
SPECIMEN JAR SMALL (MISCELLANEOUS) ×4 IMPLANT
SUT MNCRL AB 4-0 PS2 18 (SUTURE) ×4 IMPLANT
SUT VIC AB 0 UR5 27 (SUTURE) ×3 IMPLANT
SUT VICRYL 0 UR6 27IN ABS (SUTURE) IMPLANT
TOWEL GREEN STERILE (TOWEL DISPOSABLE) ×1 IMPLANT
TOWEL GREEN STERILE FF (TOWEL DISPOSABLE) ×4 IMPLANT
TRAY LAPAROSCOPIC MC (CUSTOM PROCEDURE TRAY) ×4 IMPLANT
TROCAR XCEL BLUNT TIP 100MML (ENDOMECHANICALS) ×4 IMPLANT
TROCAR XCEL NON-BLD 5MMX100MML (ENDOMECHANICALS) ×4 IMPLANT
WARMER LAPAROSCOPE (MISCELLANEOUS) ×3 IMPLANT
WATER STERILE IRR 1000ML POUR (IV SOLUTION) ×1 IMPLANT

## 2021-03-08 NOTE — Op Note (Signed)
° °  Operative Note  Date: 03/08/2021  Procedure: laparoscopic cholecystectomy  Pre-op diagnosis: symptomatic cholelithiasis, suspected choledocholithiasis Post-op diagnosis: same  Indication and clinical history: The patient is a 25 y.o. year old female with symptomatic cholelithiasis and suspected choledocholithiasis, with downtrending LFTS.  Surgeon: Diamantina Monks, MD  Anesthesiologist: Malen Gauze, MD Anesthesia: General  Findings:  Specimen: gallbladder EBL: <5cc Drains/Implants: none  Disposition: PACU - hemodynamically stable.  Description of procedure: The patient was positioned supine on the operating room table. Time-out was performed verifying correct patient, procedure, signature of informed consent, and administration of pre-operative antibiotics. General anesthetic induction and intubation were uneventful. The abdomen was prepped and draped in the usual sterile fashion. An infra-umbilical incision was made using an open technique using zero vicryl stay sutures on either side of the fascia and a 39mm Hassan port inserted. After establishing pneumoperitoneum, which the patient tolerated well, the abdominal cavity was inspected and no injury of any intra-abdominal structures was identified. Additional ports were placed under direct visualization and using local anesthetic: two 33mm ports in the right subcostal region and a 17mm port in the epigastric region. The patient was re-positioned to reverse Trendelenburg and right side up. The gallbladder was retracted cephalad and the infundibulum identified and retracted toward the right lower quadrant. The peritoneum was incised over the infundibulum and the triangle of Calot dissected. The cystic duct was clearly identified, isolated, and doubly clipped and divided. The cystic artery was not clearly identified. A vascular structure appeared to be running parallel to the gallbladder, but did not clearly appear to be entering the gallbladder,  even as high as halfway up the gallbladder, so a small branch going into the gallbladder was clipped and divided, freeing the gallbladder. The gallbladder was dissected off the liver bed using electrocautery and hemostasis of the liver bed was confirmed prior to separation of the final peritoneal attachments of the gallbladder to the liver bed. After transection of the final peritoneal attachments, the gallbladder was placed in an endoscopic specimen retrieval bag, removed via the umbilical port site, and sent to pathology as a permanent specimen. The gallbladder fossa was inspected confirming hemostasis, the absence of bile leakage from the cystic duct stump, and correct placement of clips on the cystic artery and cystic duct stumps. The abdomen was desufflated and the fascia of the umbilical port site was closed using the previously placed stay sutures. Additional local anesthetic was administered at the umbilical port site.  The skin of all incisions was closed with 4-0 monocryl. Sterile dressings were applied. All sponge and instrument counts were correct at the conclusion of the procedure. The patient was awakened from anesthesia, extubated uneventfully, and transported to the PACU - hemodynamically stable.. There were no complications.    Diamantina Monks, MD General and Trauma Surgery [facility]

## 2021-03-08 NOTE — Discharge Instructions (Signed)
May shower beginning 03/09/2021. Do not peel off or scrub skin glue. May allow warm soapy water to run over incision, then rinse and pat dry. Do not soak in any water (tubs, hot tubs, pools, lakes, oceans) for one week.   No lifting greater than 5 pounds for six weeks.   Pain regimen: take over-the-counter tylenol (acetaminophen) 1000mg  every six hours, the prescription ibuprofen (600mg ) every six hours and the robaxin (methocarbamol) 750mg  every six hours. With all three of these, you should be taking something every two hours. Example: tylenol ( acetaminophen) at 8am, ibuprofen at 10am, robaxin (methocarbamol) at 12pm, tylenol (acetaminophen) again at 2pm, ibuprofen again at 4pm, robaxin (methocarbamol) at 6pm. You also have a prescription for oxycodone, which should be taken if the tylenol (acetaminophen), ibuprofen, and robaxin (methocarbamol) are not enough to control your pain. You may take the oxycodone as frequently as every four hours as needed, but if you are taking the other medications as above, you should not need the oxycodone this frequently. You have also been given a prescription for colace (docusate) which is a stool softener. Please take this as prescribed because the oxycodone can cause constipation and the colace (docusate) will minimize or prevent constipation.  Call the office at [phone] for temperature greater than 101.73F, worsening pain, redness or warmth at the incision site.  Please call [phone] to make an appointment for 2-3 weeks after surgery for wound check.

## 2021-03-08 NOTE — Progress Notes (Signed)
Patient seen and examined. Symptomatic cholelithiasis and suspected choledocho with spontaneous passage of stone. Plan for lap chole. Informed consent was obtained after detailed explanation of risks, including bleeding, infection, biloma, hematoma, injury to common bile duct, need for IOC to delineate anatomy, and need for conversion to open procedure. All questions answered to the patient's satisfaction.  Diamantina Monks, MD General and Trauma Surgery [facility]

## 2021-03-08 NOTE — Anesthesia Preprocedure Evaluation (Addendum)
Anesthesia Evaluation  Patient identified by MRN, date of birth, ID band Patient awake    Reviewed: Allergy & Precautions, NPO status , Patient's Chart, lab work & pertinent test results, reviewed documented beta blocker date and time   Airway Mallampati: II  TM Distance: >3 FB Neck ROM: Full    Dental no notable dental hx. (+) Teeth Intact, Dental Advisory Given   Pulmonary neg pulmonary ROS,    Pulmonary exam normal breath sounds clear to auscultation       Cardiovascular hypertension, Pt. on medications Normal cardiovascular exam Rhythm:Regular Rate:Normal  Chronic HTN with superimposed pre eclampsia during pregnancy   Neuro/Psych  Headaches, negative psych ROS   GI/Hepatic Neg liver ROS, Choledocholithiasis Cholelithiasis   Endo/Other  Morbid obesityPCOS  Renal/GU negative Renal ROS  negative genitourinary   Musculoskeletal negative musculoskeletal ROS (+)   Abdominal (+) + obese,   Peds  Hematology Alpha Thalassemia carrier   Anesthesia Other Findings   Reproductive/Obstetrics NSVD on 01/25/2021                           Anesthesia Physical Anesthesia Plan  ASA: 3 and emergent  Anesthesia Plan: General   Post-op Pain Management:    Induction: Intravenous, Rapid sequence and Cricoid pressure planned  PONV Risk Score and Plan: 4 or greater and Treatment may vary due to age or medical condition, Ondansetron, Scopolamine patch - Pre-op and Dexamethasone  Airway Management Planned: Oral ETT  Additional Equipment:   Intra-op Plan:   Post-operative Plan: Extubation in OR  Informed Consent: I have reviewed the patients History and Physical, chart, labs and discussed the procedure including the risks, benefits and alternatives for the proposed anesthesia with the patient or authorized representative who has indicated his/her understanding and acceptance.     Dental advisory  given  Plan Discussed with: CRNA and Anesthesiologist  Anesthesia Plan Comments:         Anesthesia Quick Evaluation

## 2021-03-08 NOTE — Transfer of Care (Signed)
Immediate Anesthesia Transfer of Care Note  Patient: Theresa Patterson  Procedure(s) Performed: LAPAROSCOPIC CHOLECYSTECTOMY (Abdomen)  Patient Location: PACU  Anesthesia Type:General  Level of Consciousness: awake  Airway & Oxygen Therapy: Patient Spontanous Breathing and Patient connected to face mask oxygen  Post-op Assessment: Report given to RN and Post -op Vital signs reviewed and stable  Post vital signs: Reviewed and stable  Last Vitals:  Vitals Value Taken Time  BP 132/82 03/08/21 0953  Temp    Pulse 90 03/08/21 0954  Resp 10 03/08/21 0954  SpO2 100 % 03/08/21 0954  Vitals shown include unvalidated device data.  Last Pain:  Vitals:   03/08/21 0720  TempSrc: Oral  PainSc:       Patients Stated Pain Goal: 0 (03/07/21 2006)  Complications: No notable events documented.

## 2021-03-08 NOTE — Anesthesia Procedure Notes (Signed)
Procedure Name: Intubation Date/Time: 03/08/2021 8:30 AM Performed by: Erick Colace, CRNA Pre-anesthesia Checklist: Patient identified, Emergency Drugs available, Suction available and Patient being monitored Patient Re-evaluated:Patient Re-evaluated prior to induction Oxygen Delivery Method: Circle system utilized Preoxygenation: Pre-oxygenation with 100% oxygen Induction Type: IV induction, Cricoid Pressure applied and Rapid sequence Ventilation: Mask ventilation without difficulty Laryngoscope Size: Mac and 4 Grade View: Grade I Tube type: Oral Number of attempts: 1 Airway Equipment and Method: Stylet and Oral airway Placement Confirmation: ETT inserted through vocal cords under direct vision, positive ETCO2 and breath sounds checked- equal and bilateral Secured at: 21 cm Tube secured with: Tape Dental Injury: Teeth and Oropharynx as per pre-operative assessment

## 2021-03-08 NOTE — H&P (Signed)
CC/Reason for consult: Choledocholithiasis   Requesting provider: Jamilla walker cnm   HPI: Theresa Patterson is an 25 y.o. female hx of HTN, PCOS, morbid obesity whom is s/p svd 01/25/21 with know issues regarding gallstones, presented to MAU with 26 hr hx of worsening MEG+RUQ pain radiating to back. Pain is constant and sharp. Associated nausea and vomiting - 5x last night, none today. No aggravating/alleviating factors. No fever or chills. Denies blood in her stool, black stool, recent constipation or diarrhea. No tylenol ingestion. No history of hepatitis.    She underwent workup in MAU -   WBC 7 AST/ALT 465/511 Tbili 1.1 Alk phos 189 Lipase 34   RUQ US shows gallstones, largest 5 mm. No gallbladder wall thickening. CBD 2.8 mm. No pericholecystic fluid   LFTs were noted to be normal at time of delivery and throughout pregnancy  Past Medical History:  Diagnosis Date   Alpha thalassemia silent carrier 10/15/2020   Chronic headaches 09/30/2020   Hypertension    Morbid obesity ([place]) 08/20/2020   Polycystic ovary syndrome     Past Surgical History:  Procedure Laterality Date   NO PAST SURGERIES      Family History  Problem Relation Age of Onset   Obesity Mother    Obesity Maternal Grandmother     Social:  reports that she has never smoked. She has never used smokeless tobacco. She reports that she does not currently use alcohol. She reports that she does not currently use drugs.  Allergies: No Known Allergies  Medications: I have reviewed the patient's current medications.  Results for orders placed or performed during the hospital encounter of 03/07/21 (from the past 48 hour(s))  CBC with Differential/Platelet     Status: None   Collection Time: 03/07/21  9:18 PM  Result Value Ref Range   WBC 7.3 4.0 - 10.5 K/uL   RBC 4.70 3.87 - 5.11 MIL/uL   Hemoglobin 12.2 12.0 - 15.0 g/dL   HCT 38.7 36.0 - 46.0 %   MCV 82.3 80.0 - 100.0 fL   MCH 26.0 26.0 - 34.0 pg   MCHC  31.5 30.0 - 36.0 g/dL   RDW 14.9 11.5 - 15.5 %   Platelets 343 150 - 400 K/uL   nRBC 0.0 0.0 - 0.2 %   Neutrophils Relative % 51 %   Neutro Abs 3.8 1.7 - 7.7 K/uL   Lymphocytes Relative 39 %   Lymphs Abs 2.9 0.7 - 4.0 K/uL   Monocytes Relative 8 %   Monocytes Absolute 0.6 0.1 - 1.0 K/uL   Eosinophils Relative 1 %   Eosinophils Absolute 0.1 0.0 - 0.5 K/uL   Basophils Relative 1 %   Basophils Absolute 0.1 0.0 - 0.1 K/uL   Immature Granulocytes 0 %   Abs Immature Granulocytes 0.02 0.00 - 0.07 K/uL    Comment: Performed at [facility], [address] [place], [place] [address]  Comprehensive metabolic panel     Status: Abnormal   Collection Time: 03/07/21  9:18 PM  Result Value Ref Range   Sodium 137 135 - 145 mmol/L   Potassium 3.6 3.5 - 5.1 mmol/L   Chloride 102 98 - 111 mmol/L   CO2 27 22 - 32 mmol/L   Glucose, Bld 86 70 - 99 mg/dL    Comment: Glucose reference range applies only to samples taken after fasting for at least 8 hours.   BUN 9 6 - 20 mg/dL   Creatinine, Ser 1.01 (H) 0.44 -  1.00 mg/dL   Calcium 9.6 8.9 - 10.3 mg/dL   Total Protein 7.6 6.5 - 8.1 g/dL   Albumin 3.9 3.5 - 5.0 g/dL   AST 465 (H) 15 - 41 U/L   ALT 511 (H) 0 - 44 U/L   Alkaline Phosphatase 189 (H) 38 - 126 U/L   Total Bilirubin 1.1 0.3 - 1.2 mg/dL   GFR, Estimated >60 >60 mL/min    Comment: (NOTE) Calculated using the CKD-EPI Creatinine Equation (2021)    Anion gap 8 5 - 15    Comment: Performed at [facility] [address], [place], [place] [address]  Lipase, blood     Status: None   Collection Time: 03/07/21  9:18 PM  Result Value Ref Range   Lipase 34 11 - 51 U/L    Comment: Performed at [facility] [address], [place], [place] [address]  Amylase     Status: Abnormal   Collection Time: 03/07/21  9:18 PM  Result Value Ref Range   Amylase 104 (H) 28 - 100 U/L    Comment: Performed at [facility], [place] [address], [place], [place] [address]    US Abdomen  Limited RUQ (LIVER/GB)  Result Date: 03/07/2021 CLINICAL DATA:  Right upper quadrant pain. EXAM: ULTRASOUND ABDOMEN LIMITED RIGHT UPPER QUADRANT COMPARISON:  None. FINDINGS: Gallbladder: Multiple small shadowing echogenic gallstones are seen within the gallbladder lumen. The largest measures approximately 5 mm. The gallbladder wall measures 2.6 mm in thickness. No sonographic Murphy sign noted by sonographer. Common bile duct: Diameter: 2.8 mm Liver: No focal lesion identified. Within normal limits in parenchymal echogenicity. Portal vein is patent on color Doppler imaging with normal direction of blood flow towards the liver. Other: None. IMPRESSION: Cholelithiasis, without acute cholecystitis. Electronically Signed   By: Virgina Norfolk M.D.   On: 03/07/2021 22:02    ROS - all of the below systems have been reviewed with the patient and positives are indicated with bold text General: chills, fever or night sweats Eyes: blurry vision or double vision ENT: epistaxis or sore throat Allergy/Immunology: itchy/watery eyes or nasal congestion Hematologic/Lymphatic: bleeding problems, blood clots or swollen lymph nodes Endocrine: temperature intolerance or unexpected weight changes Breast: new or changing breast lumps or nipple discharge Resp: cough, shortness of breath, or wheezing CV: chest pain or dyspnea on exertion GI: as per HPI GU: dysuria, trouble voiding, or hematuria MSK: joint pain or joint stiffness Neuro: TIA or stroke symptoms Derm: pruritus and skin lesion changes Psych: anxiety and depression  PE Blood pressure 127/70, pulse 79, temperature 98 F (36.7 C), temperature source Oral, resp. rate 17, height '5\' 8"'  (1.727 m), weight (!) 143.9 kg, SpO2 100 %, unknown if currently breastfeeding. Constitutional: NAD; conversant; no deformities Eyes: Moist conjunctiva; no lid lag; anicteric; PERRL Neck: Trachea midline; no thyromegaly Lungs: Normal respiratory effort; no tactile  fremitus CV: RRR; no palpable thrills; no pitting edema GI:  abdomen is obese, soft, minimal MEG tenderness, negative Murphy's, no rebound nor guarding, nondistended; no palpable hepatosplenomegaly MSK: Normal range of motion of extremities; no clubbing/cyanosis Psychiatric: Appropriate affect; alert and oriented x3 Lymphatic: No palpable cervical or axillary lymphadenopathy  Results for orders placed or performed during the hospital encounter of 03/07/21 (from the past 48 hour(s))  CBC with Differential/Platelet     Status: None   Collection Time: 03/07/21  9:18 PM  Result Value Ref Range   WBC 7.3 4.0 - 10.5 K/uL   RBC  4.70 3.87 - 5.11 MIL/uL   Hemoglobin 12.2 12.0 - 15.0 g/dL   HCT 38.7 36.0 - 46.0 %   MCV 82.3 80.0 - 100.0 fL   MCH 26.0 26.0 - 34.0 pg   MCHC 31.5 30.0 - 36.0 g/dL   RDW 14.9 11.5 - 15.5 %   Platelets 343 150 - 400 K/uL   nRBC 0.0 0.0 - 0.2 %   Neutrophils Relative % 51 %   Neutro Abs 3.8 1.7 - 7.7 K/uL   Lymphocytes Relative 39 %   Lymphs Abs 2.9 0.7 - 4.0 K/uL   Monocytes Relative 8 %   Monocytes Absolute 0.6 0.1 - 1.0 K/uL   Eosinophils Relative 1 %   Eosinophils Absolute 0.1 0.0 - 0.5 K/uL   Basophils Relative 1 %   Basophils Absolute 0.1 0.0 - 0.1 K/uL   Immature Granulocytes 0 %   Abs Immature Granulocytes 0.02 0.00 - 0.07 K/uL    Comment: Performed at [facility] [address], [place], [place] [address]  Comprehensive metabolic panel     Status: Abnormal   Collection Time: 03/07/21  9:18 PM  Result Value Ref Range   Sodium 137 135 - 145 mmol/L   Potassium 3.6 3.5 - 5.1 mmol/L   Chloride 102 98 - 111 mmol/L   CO2 27 22 - 32 mmol/L   Glucose, Bld 86 70 - 99 mg/dL    Comment: Glucose reference range applies only to samples taken after fasting for at least 8 hours.   BUN 9 6 - 20 mg/dL   Creatinine, Ser 1.01 (H) 0.44 - 1.00 mg/dL   Calcium 9.6 8.9 - 10.3 mg/dL   Total Protein 7.6 6.5 - 8.1 g/dL   Albumin 3.9 3.5 - 5.0 g/dL   AST 465 (H)  15 - 41 U/L   ALT 511 (H) 0 - 44 U/L   Alkaline Phosphatase 189 (H) 38 - 126 U/L   Total Bilirubin 1.1 0.3 - 1.2 mg/dL   GFR, Estimated >60 >60 mL/min    Comment: (NOTE) Calculated using the CKD-EPI Creatinine Equation (2021)    Anion gap 8 5 - 15    Comment: Performed at [facility], [place] [address] [place], [place] [address]  Lipase, blood     Status: None   Collection Time: 03/07/21  9:18 PM  Result Value Ref Range   Lipase 34 11 - 51 U/L    Comment: Performed at [facility] [address], [place], [place] [address]  Amylase     Status: Abnormal   Collection Time: 03/07/21  9:18 PM  Result Value Ref Range   Amylase 104 (H) 28 - 100 U/L    Comment: Performed at [facility], [place] [address], [place], [place] [address]    US Abdomen Limited RUQ (LIVER/GB)  Result Date: 03/07/2021 CLINICAL DATA:  Right upper quadrant pain. EXAM: ULTRASOUND ABDOMEN LIMITED RIGHT UPPER QUADRANT COMPARISON:  None. FINDINGS: Gallbladder: Multiple small shadowing echogenic gallstones are seen within the gallbladder lumen. The largest measures approximately 5 mm. The gallbladder wall measures 2.6 mm in thickness. No sonographic Murphy sign noted by sonographer. Common bile duct: Diameter: 2.8 mm Liver: No focal lesion identified. Within normal limits in parenchymal echogenicity. Portal vein is patent on color Doppler imaging with normal direction of blood flow towards the liver. Other: None. IMPRESSION: Cholelithiasis, without acute cholecystitis. Electronically Signed   By: Virgina Norfolk M.D.   On: 03/07/2021 22:02  I have personally reviewed the relevant RUQ [place], LFTs, lipase, CBC  A/P: Theresa Patterson is an 25 y.o. female with HTN, PCOS, morbid obesity with possible choledocholithiasis   -NPO after midnight tonight -The anatomy and physiology of the hepatobiliary system was discussed with the patient. The pathophysiology of gallbladder disease was discussed as well -Will plan  for admission to hospital -Repeat LFTs in AM -GI consult vs surgery in AM based on LFT trend and clinical course -Will likely plan cholecystectomy however prior to discharge to prevent this from happening again -We discussed that if surgery is in the morning, would be with one of my partners, Dr. Bobbye Morton, whom she would meet prior to surgery to go over details as well  Nadeen Landau, MD [facility], [place]

## 2021-03-08 NOTE — Anesthesia Postprocedure Evaluation (Signed)
Anesthesia Post Note  Patient: Morene Cecilio  Procedure(s) Performed: LAPAROSCOPIC CHOLECYSTECTOMY (Abdomen)     Patient location during evaluation: PACU Anesthesia Type: General Level of consciousness: awake and alert and oriented Pain management: pain level controlled Vital Signs Assessment: post-procedure vital signs reviewed and stable Respiratory status: spontaneous breathing, nonlabored ventilation and respiratory function stable Cardiovascular status: blood pressure returned to baseline and stable Postop Assessment: no apparent nausea or vomiting Anesthetic complications: no   No notable events documented.  Last Vitals:  Vitals:   03/08/21 1025 03/08/21 1050  BP: 129/82 122/83  Pulse: 79 79  Resp: 12 15  Temp:  36.6 C  SpO2: 96% 96%    Last Pain:  Vitals:   03/08/21 1047  TempSrc:   PainSc: 6                  Sherill Wegener A.

## 2021-03-08 NOTE — MAU Note (Signed)
Spoke to patient placement about an admission bed. Olegario Messier, from bed placement, stated there is no beds and they will need to wait for discharges.  Provider notified.

## 2021-03-09 ENCOUNTER — Encounter (HOSPITAL_COMMUNITY): Payer: Self-pay | Admitting: Surgery

## 2021-03-09 LAB — HEPATIC FUNCTION PANEL
ALT: 192 U/L — ABNORMAL HIGH (ref 0–44)
AST: 66 U/L — ABNORMAL HIGH (ref 15–41)
Albumin: 3 g/dL — ABNORMAL LOW (ref 3.5–5.0)
Alkaline Phosphatase: 135 U/L — ABNORMAL HIGH (ref 38–126)
Bilirubin, Direct: 0.1 mg/dL (ref 0.0–0.2)
Indirect Bilirubin: 0.4 mg/dL (ref 0.3–0.9)
Total Bilirubin: 0.5 mg/dL (ref 0.3–1.2)
Total Protein: 6 g/dL — ABNORMAL LOW (ref 6.5–8.1)

## 2021-03-09 LAB — SURGICAL PATHOLOGY

## 2021-03-09 MED ORDER — ACETAMINOPHEN 500 MG PO TABS: 1000.0000 mg | ORAL_TABLET | Freq: Four times a day (QID) | ORAL | Status: AC | PRN

## 2021-03-09 NOTE — Discharge Summary (Signed)
[facility] Discharge Summary   Patient ID: Theresa Patterson MRN: [id number] DOB/AGE: 1995/07/27 25 y.o.  Admit date: 03/07/2021 Discharge date: 03/09/2021  Admitting Diagnosis: Choledocholithiasis [K80.50]   Discharge Diagnosis Patient Active Problem List   Diagnosis Date Noted   Choledocholithiasis 03/08/2021   BMI 45.0-49.9, adult (HCC) 12/14/2020  Status post laparoscopic cholecystectomy    Consultants None   Imaging: US Abdomen Limited RUQ (LIVER/GB)  Result Date: 03/07/2021 CLINICAL DATA:  Right upper quadrant pain. EXAM: ULTRASOUND ABDOMEN LIMITED RIGHT UPPER QUADRANT COMPARISON:  None. FINDINGS: Gallbladder: Multiple small shadowing echogenic gallstones are seen within the gallbladder lumen. The largest measures approximately 5 mm. The gallbladder wall measures 2.6 mm in thickness. No sonographic Murphy sign noted by sonographer. Common bile duct: Diameter: 2.8 mm Liver: No focal lesion identified. Within normal limits in parenchymal echogenicity. Portal vein is patent on color Doppler imaging with normal direction of blood flow towards the liver. Other: None. IMPRESSION: Cholelithiasis, without acute cholecystitis. Electronically Signed   By: Aram Candela M.D.   On: 03/07/2021 22:02    Procedures Dr. Bedelia Person (03/08/21) - Laparoscopic Cholecystectomy  Hospital Course:  25 year old female who presented to MAU with worsening abdominal pain, nausea, emesis.  Workup showed possible choledocholithiasis.  Patient was admitted and underwent procedure listed above.  Tolerated procedure well and was transferred to the floor.  Diet was advanced as tolerated.  LFTs monitored during admission and improved steadily. On POD1, the patient was voiding well, tolerating diet, ambulating well, pain well controlled, vital signs stable, incisions c/d/i and felt stable for discharge home.  Patient will follow up in our office in 2 weeks and knows to call with questions or concerns.     Physical Exam: General:  Alert, NAD, pleasant, comfortable Heart: regular rate and rhythm Resp: CTAB, respiratory effort unlabored on room air Abd:  Soft, ND, mild tenderness, incisions C/D/I MSK: MAE, calves soft non edematous, nontender bilaterally Skin: warm and dry  I or a member of my team have reviewed this patient in the Controlled Substance Database.  Allergies as of 03/09/2021   No Known Allergies      Medication List     STOP taking these medications    sertraline 50 MG tablet Commonly known as: Zoloft       TAKE these medications    acetaminophen 500 MG tablet Commonly known as: TYLENOL Take 2 tablets (1,000 mg total) by mouth every 6 (six) hours as needed.   aspirin EC 81 MG tablet Take 81 mg by mouth daily. Swallow whole.   busPIRone 15 MG tablet Commonly known as: BUSPAR Take 15 mg by mouth in the morning, at noon, and at bedtime.   clonazePAM 0.5 MG tablet Commonly known as: KLONOPIN Take 0.5 mg by mouth daily as needed for anxiety.   docusate sodium 100 MG capsule Commonly known as: COLACE Take 1 capsule (100 mg total) by mouth 2 (two) times daily. What changed:  when to take this reasons to take this   escitalopram 20 MG tablet Commonly known as: LEXAPRO Take 20 mg by mouth daily.   ibuprofen 600 MG tablet Commonly known as: ADVIL Take 1 tablet (600 mg total) by mouth 4 (four) times daily. What changed:  when to take this reasons to take this   methocarbamol 750 MG tablet Commonly known as: Robaxin-750 Take 1 tablet (750 mg total) by mouth 4 (four) times daily.   NIFEdipine 90 MG 24 hr tablet Commonly known as: ADALAT CC Take  1 tablet (90 mg total) by mouth daily.   oxyCODONE 5 MG immediate release tablet Commonly known as: Roxicodone Take 1 tablet (5 mg total) by mouth every 4 (four) hours as needed.          Follow-up Information     [facility] Follow up on 03/29/2021.   Specialty: General  Surgery Why: 1/17 at 10am. Please arrive 45 minutes prior to your appointment for paperwork. Please bring a copy of your photo ID and insurance card. Contact information: [address] [place] [address] [phone]                 Signed: Eric Form , [facility] 03/09/2021, 10:29 AM Please see Amion for pager number during day hours 7:00am-4:30pm

## 2021-03-13 ENCOUNTER — Encounter: Payer: Self-pay | Admitting: Obstetrics & Gynecology

## 2021-03-21 ENCOUNTER — Other Ambulatory Visit: Payer: Self-pay | Admitting: Obstetrics & Gynecology

## 2021-03-21 DIAGNOSIS — O119 Pre-existing hypertension with pre-eclampsia, unspecified trimester: Secondary | ICD-10-CM

## 2021-03-21 MED ORDER — NIFEDIPINE ER 90 MG PO TB24: 90.0000 mg | ORAL_TABLET | Freq: Every day | ORAL | 1 refills | Status: AC

## 2021-04-05 ENCOUNTER — Telehealth: Payer: BC Managed Care – PPO | Admitting: Obstetrics

## 2021-04-07 ENCOUNTER — Encounter: Payer: Self-pay | Admitting: Obstetrics

## 2021-04-07 ENCOUNTER — Telehealth (INDEPENDENT_AMBULATORY_CARE_PROVIDER_SITE_OTHER): Payer: Medicaid Other | Admitting: Obstetrics

## 2021-04-07 DIAGNOSIS — Z30011 Encounter for initial prescription of contraceptive pills: Secondary | ICD-10-CM | POA: Diagnosis not present

## 2021-04-07 DIAGNOSIS — Z6841 Body Mass Index (BMI) 40.0 and over, adult: Secondary | ICD-10-CM

## 2021-04-07 DIAGNOSIS — Z3009 Encounter for other general counseling and advice on contraception: Secondary | ICD-10-CM

## 2021-04-07 DIAGNOSIS — Z9049 Acquired absence of other specified parts of digestive tract: Secondary | ICD-10-CM | POA: Diagnosis not present

## 2021-04-07 MED ORDER — LO LOESTRIN FE 1 MG-10 MCG / 10 MCG PO TABS: 1.0000 | ORAL_TABLET | Freq: Every day | ORAL | 11 refills | Status: AC

## 2021-04-07 NOTE — Progress Notes (Addendum)
I connected with  Theresa Patterson on 04/07/21 by a video enabled telemedicine application and verified that I am speaking with the correct person using two identifiers.   I discussed the limitations of evaluation and management by telemedicine. The patient expressed understanding and agreed to proceed.   MyChart 2 months PP, wants OCP for BC.     Post Partum Visit Note  Theresa Patterson is a 26 y.o. G60P1011 female who presents for a postpartum visit. She is 8 weeks postpartum following a normal spontaneous vaginal delivery.  I have fully reviewed the prenatal and intrapartum course. The delivery was at 37.2 gestational weeks.  Anesthesia: epidural. Postpartum course has been Cholecystectomy. Baby is doing well. Baby is feeding by bottle - Similac Alimentum. Bleeding no bleeding. Bowel function is normal. Bladder function is normal. Patient is sexually active. Contraception method is condoms. Postpartum depression screening: negative.   The pregnancy intention screening data noted above was reviewed. Potential methods of contraception were discussed. The patient elected to proceed with No data recorded.   Edinburgh Postnatal Depression Scale - 04/07/21 1407       Edinburgh Postnatal Depression Scale:  In the Past 7 Days   I have been able to laugh and see the funny side of things. 0    I have looked forward with enjoyment to things. 0    I have blamed myself unnecessarily when things went wrong. 0    I have been anxious or worried for no good reason. 0    I have felt scared or panicky for no good reason. 0    Things have been getting on top of me. 0    I have been so unhappy that I have had difficulty sleeping. 0    I have felt sad or miserable. 0    I have been so unhappy that I have been crying. 0    The thought of harming myself has occurred to me. 0    Edinburgh Postnatal Depression Scale Total 0             Health Maintenance Due  Topic Date Due   COVID-19 Vaccine (1) Never  done   HPV VACCINES (1 - 2-dose series) Never done   Hepatitis C Screening  Never done   INFLUENZA VACCINE  Never done    The following portions of the patient's history were reviewed and updated as appropriate: allergies, current medications, past family history, past medical history, past social history, past surgical history, and problem list.  Review of Systems A comprehensive review of systems was negative.  Objective:  BP 130/78    Pulse 90    Wt (!) 315 lb (142.9 kg)    Breastfeeding No    BMI 47.90 kg/m    General:  alert and no distress   Breasts:  normal  Lungs: clear to auscultation bilaterally  Heart:  regular rate and rhythm, S1, S2 normal, no murmur, click, rub or gallop  The remainder of physical exam deferred due to the type of encounter.  I have spent a total of 15 minutes of non-face-to-face time, excluding clinical staff time, reviewing notes and preparing to see patient, ordering tests and/or medications, and counseling the patient.   Assessment:   1. Postpartum care following vaginal delivery - doing well  2. S/P laparoscopic cholecystectomy - doing well  3. Encounter for other general counseling and advice on contraception - wants OCP's  4. Encounter for initial prescription of contraceptive pills Rx: - LO LOESTRIN  FE 1 MG-10 MCG / 10 MCG tablet; Take 1 tablet by mouth daily.  Dispense: 28 tablet; Refill: 11  5. Class 3 severe obesity without serious comorbidity with body mass index (BMI) of 40.0 to 44.9 in adult, unspecified obesity type (HCC) - weight reduction with the aid of dietary changes, exercise and behavioral modification recommended    Plan:   Essential components of care per ACOG recommendations:  1.  Mood and well being: Patient with negative depression screening today. Reviewed local resources for support.  - Patient tobacco use? No.   - hx of drug use? No.    2. Infant care and feeding:  -Patient currently breastmilk feeding? No.   -Social determinants of health (SDOH) reviewed in EPIC. No concerns.  3. Sexuality, contraception and birth spacing - Patient does not want a pregnancy in the next year.  Desired family size is undecided  - Reviewed forms of contraception in tiered fashion. Patient desired oral contraceptives (estrogen/progesterone) today.   - Discussed birth spacing of 18 months  4. Sleep and fatigue -Encouraged family/partner/community support of 4 hrs of uninterrupted sleep to help with mood and fatigue  5. Physical Recovery  - Discussed patients delivery and complications. She describes her labor as good. - Patient had a Vaginal, no problems at delivery. Patient had a  no  lacerations. - Patient has urinary incontinence? No. - Patient is safe to resume physical and sexual activity  6.  Health Maintenance - HM due items addressed Yes - Last pap smear  Diagnosis  Date Value Ref Range Status  08/19/2020      - Negative for Intraepithelial Lesions or Malignancy (NILM)  08/19/2020 - Benign reactive/reparative changes     Pap smear not done at today's visit.  -Breast Cancer screening indicated? No.   7. Chronic Disease/Pregnancy Condition follow up: None  - PCP follow up prn   Brock Bad, MD 04/07/2021 2:38 PM

## 2021-04-28 ENCOUNTER — Encounter: Payer: Self-pay | Admitting: Obstetrics

## 2021-04-29 ENCOUNTER — Other Ambulatory Visit: Payer: Self-pay | Admitting: Obstetrics

## 2021-04-29 ENCOUNTER — Telehealth: Payer: Self-pay

## 2021-04-29 NOTE — Telephone Encounter (Signed)
S/w patient and she stated that since starting [facility] pills she has become extra moody and depressed. Pt denies any suicidal ideations. Advised that she is scheduled with counselor in office next week, pt agreed.

## 2021-05-03 ENCOUNTER — Institutional Professional Consult (permissible substitution): Payer: Medicaid Other | Admitting: Licensed Clinical Social Worker

## 2021-05-03 ENCOUNTER — Ambulatory Visit: Payer: Medicaid Other

## 2021-05-05 ENCOUNTER — Other Ambulatory Visit: Payer: Self-pay

## 2021-05-05 ENCOUNTER — Ambulatory Visit (INDEPENDENT_AMBULATORY_CARE_PROVIDER_SITE_OTHER): Payer: Medicaid Other

## 2021-05-05 DIAGNOSIS — Z0130 Encounter for examination of blood pressure without abnormal findings: Secondary | ICD-10-CM

## 2021-05-05 DIAGNOSIS — Z793 Long term (current) use of hormonal contraceptives: Secondary | ICD-10-CM

## 2021-05-05 NOTE — Progress Notes (Addendum)
Subjective:  Theresa Patterson is a 26 y.o. female here for BP check.   Hypertension ROS: taking medications as instructed, no medication side effects noted, no TIA's, no chest pain on exertion, no dyspnea on exertion, and no swelling of ankles.    Objective:  BP 128/77    Pulse 72    LMP 04/13/2021 (Approximate)    Breastfeeding No   Appearance alert, well appearing, and in no distress. General exam BP noted to be well controlled today in office.    Assessment:   Blood Pressure well controlled.   Plan:  Per Dr. Jodi Mourning Current treatment plan is effective, no change in therapy.   Patient was advised to establish care with a Primary Care Provider for management of BP.   Patient was assessed and managed by nursing staff during this encounter. I have reviewed the chart and agree with the documentation and plan. I have also made any necessary editorial changes.  Baltazar Najjar, MD 05/06/2021 8:26 AM

## 2021-06-29 IMAGING — US US OB TRANSVAGINAL
1 series · 15 of 28 positions shown · non-contrast
Comparison: 06/25/2020

CLINICAL DATA: First trimester pregnancy with inconclusive fetal
viability.

EXAM:
TRANSVAGINAL OB ULTRASOUND
TECHNIQUE: Transvaginal ultrasound was performed for complete evaluation of the
gestation as well as the maternal uterus, adnexal regions, and
pelvic cul-de-sac.

[Series 1: us ob transvaginal · 58 acquisitions, 15 frames shown]
[im 1/58]
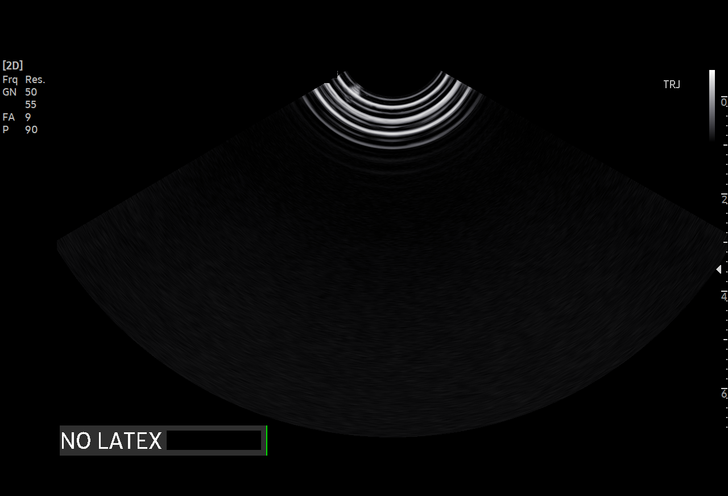
[im 5/58]
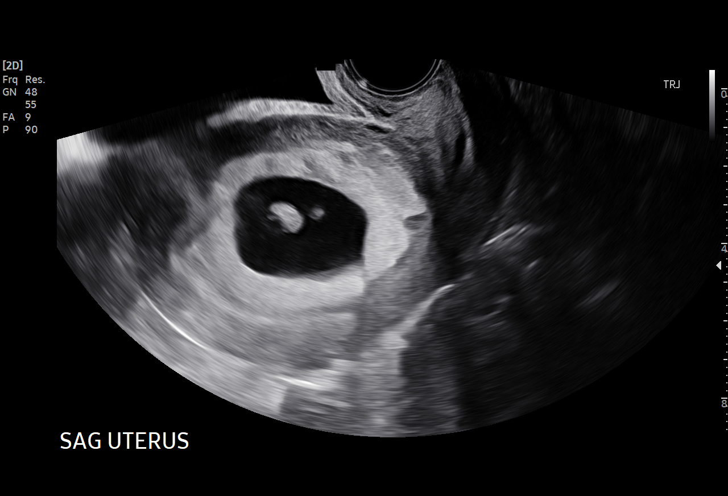
[im 9/58]
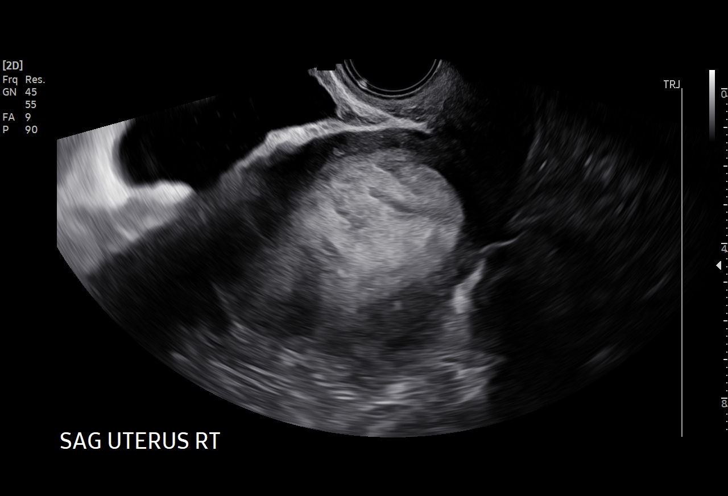
[im 13/58]
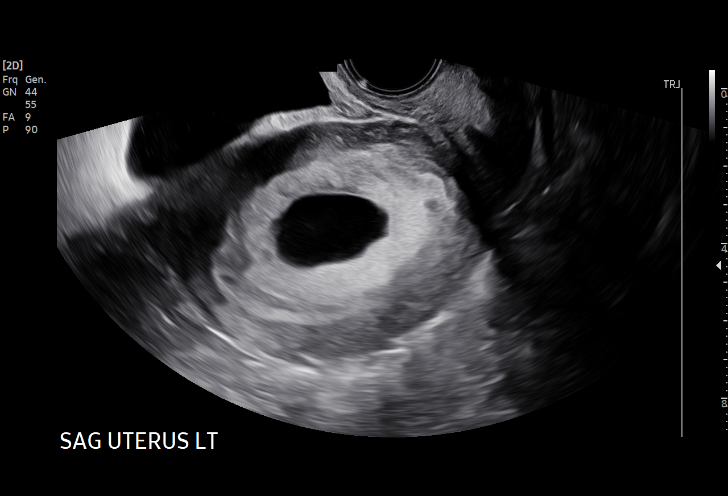
[im 17/58]
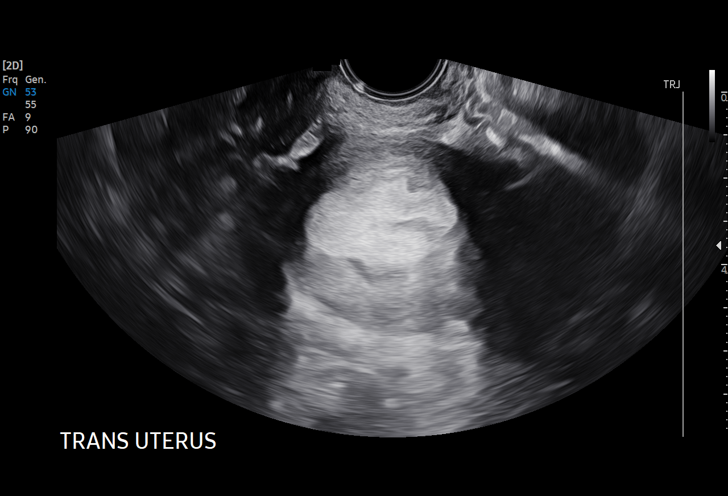
[im 22/58]
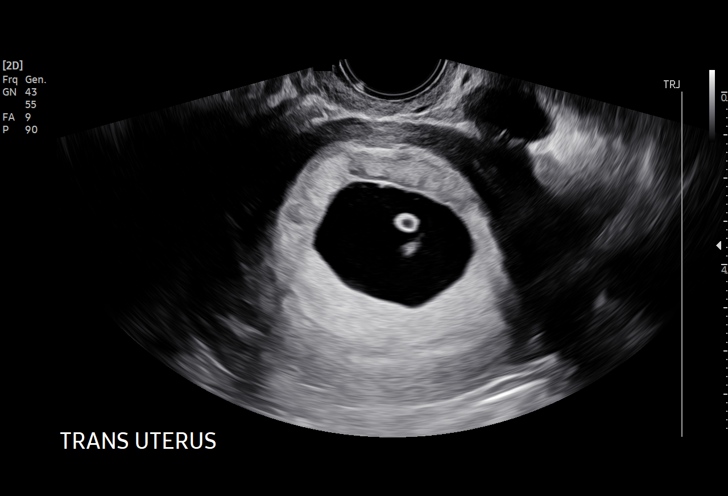
[im 26/58]
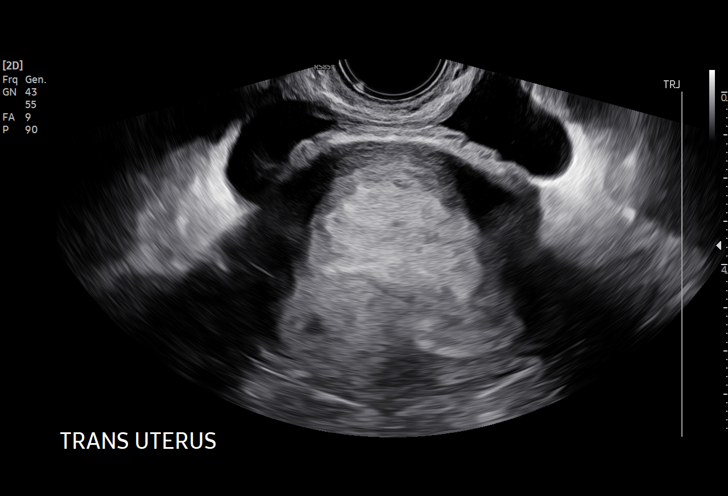
[im 30/58]
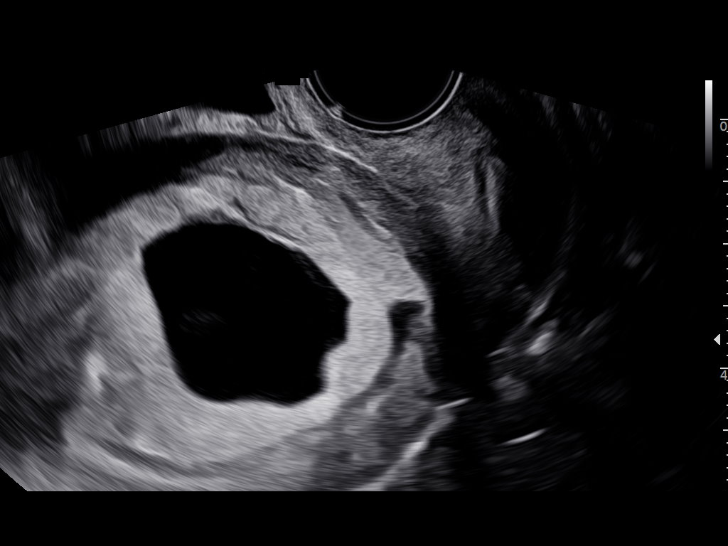
[im 32/58]
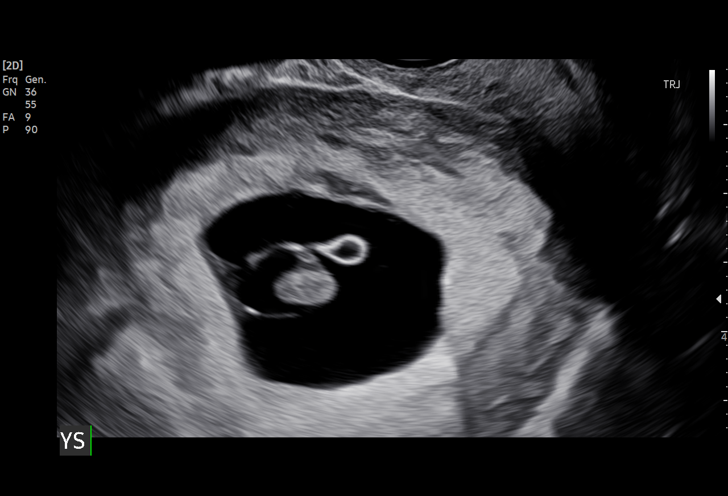
[im 36/58]
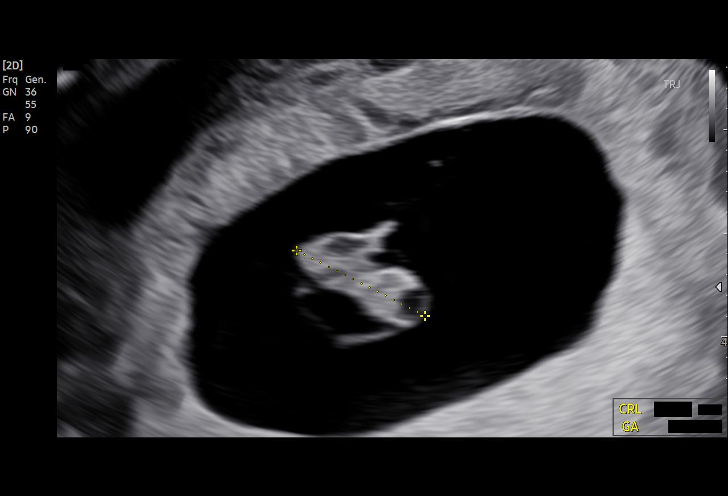
[im 41/58]
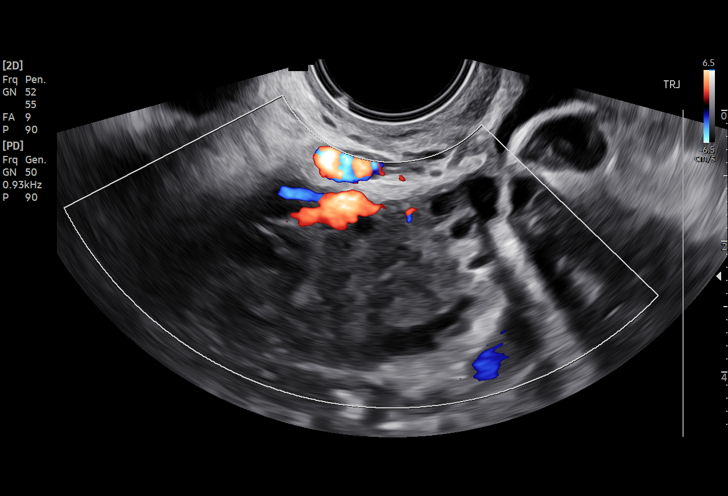
[im 45/58]
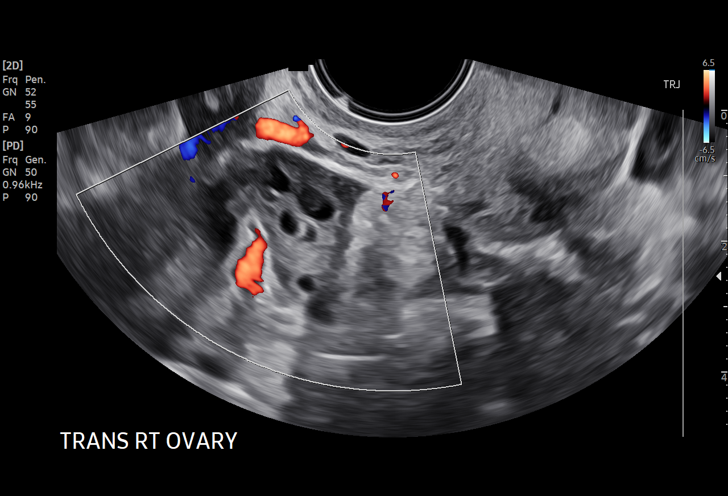
[im 49/58]
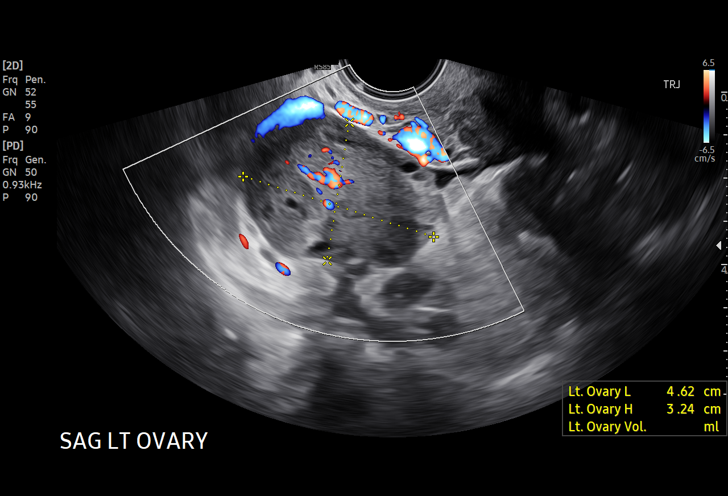
[im 53/58]
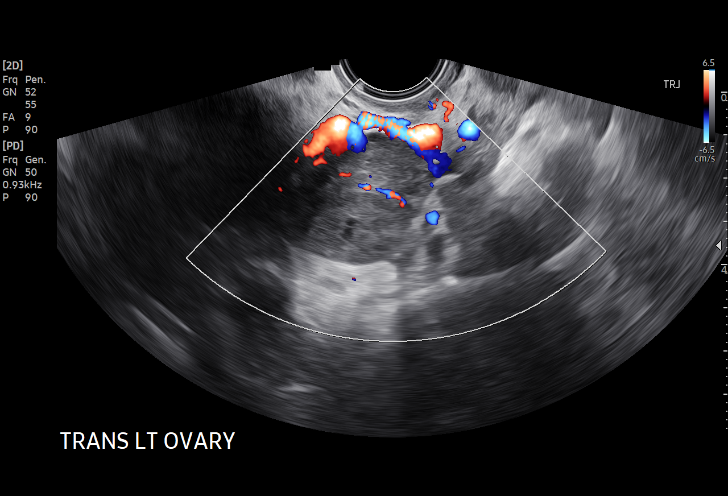
[im 58/58]
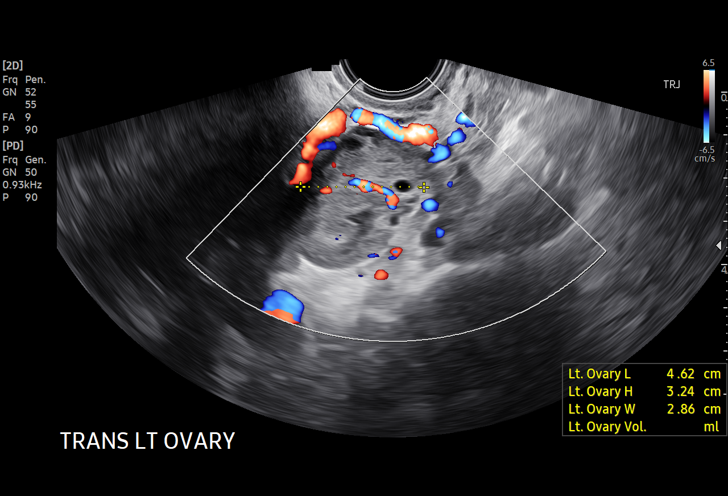

[15 of 28 positions shown; findings below may reference images not displayed]

FINDINGS: Intrauterine gestational sac: Single

Yolk sac:  Visualized.

Embryo:  Visualized.

Cardiac Activity: Visualized.

Heart Rate: 148 bpm

CRL:   14 mm   7 w 5 d                  US EDC: 02/18/2021

Subchorionic hemorrhage:  None visualized.

Maternal uterus/adnexae: Both ovaries are normal in appearance. No
mass or abnormal free fluid identified.
IMPRESSION: Assigned GA currently 7 weeks 6 days by prior ultrasound.
Appropriate growth since prior exam.

No maternal uterine or adnexal abnormality identified.

## 2021-08-04 ENCOUNTER — Emergency Department (HOSPITAL_COMMUNITY)
Admission: EM | Admit: 2021-08-04 | Discharge: 2021-08-04 | Disposition: A | Discharge status: Home / Self Care | Payer: Medicaid Other | Attending: Emergency Medicine | Admitting: Emergency Medicine

## 2021-08-04 ENCOUNTER — Other Ambulatory Visit: Payer: Self-pay

## 2021-08-04 ENCOUNTER — Encounter (HOSPITAL_COMMUNITY): Payer: Self-pay | Admitting: Emergency Medicine

## 2021-08-04 DIAGNOSIS — G8929 Other chronic pain: Secondary | ICD-10-CM | POA: Diagnosis not present

## 2021-08-04 DIAGNOSIS — M5431 Sciatica, right side: Secondary | ICD-10-CM | POA: Insufficient documentation

## 2021-08-04 DIAGNOSIS — M545 Low back pain, unspecified: Secondary | ICD-10-CM | POA: Diagnosis present

## 2021-08-04 DIAGNOSIS — Z7982 Long term (current) use of aspirin: Secondary | ICD-10-CM | POA: Diagnosis not present

## 2021-08-04 LAB — POC URINE PREG, ED: Preg Test, Ur: NEGATIVE

## 2021-08-04 MED ORDER — PREDNISONE 10 MG PO TABS: ORAL_TABLET | ORAL | 0 refills | Status: AC

## 2021-08-04 MED ORDER — PREDNISONE 50 MG PO TABS
60.0000 mg | ORAL_TABLET | Freq: Once | ORAL | Status: AC
  Administered 2021-08-04: 60 mg via ORAL
  Filled 2021-08-04: qty 1

## 2021-08-04 MED ORDER — METHOCARBAMOL 750 MG PO TABS: 750.0000 mg | ORAL_TABLET | Freq: Three times a day (TID) | ORAL | 0 refills | Status: AC | PRN

## 2021-08-04 NOTE — Discharge Instructions (Signed)
Take your next dose of prednisone tomorrow morning.  Use the the other medicines as directed.   Avoid lifting,  Bending,  Twisting or any other activity that worsens your pain over the next week. Remember to use your knees, not your back when you need to lift an object.   Apply an  icepack  to your lower back for 10-15 minutes every 2 hours for the next 2 days.  You should get rechecked if your symptoms are not better over the next 5 days,  Or you develop increased pain,  Weakness in your leg(s) or loss of bladder or bowel function - these are symptoms of a worse injury.

## 2021-08-04 NOTE — ED Provider Notes (Signed)
[facility] EMERGENCY DEPARTMENT Provider Note   CSN: [id number] Arrival date & time: 08/04/21  [id number]     History  Chief Complaint  Patient presents with   Back Pain    Theresa Patterson is a 26 y.o. female  presents with acute on chronic low back pain which has which has been present for the past 3 days.   Patient denies any new injury specifically but has been active packing and moving boxes as she is planning to move to [place] next week.  There is radiation of pain into the right lateral calf region.  There has been no weakness or numbness in the lower extremities and no urinary or bowel retention or incontinence.  Patient does not have a history of cancer or IVDU.  The patient has tried Tylenol without significant relief of symptoms.  She endorses having previous episodes of similar symptoms which generally responds to anti-inflammatories and Robaxin.   The history is provided by the patient.      Home Medications Prior to Admission medications   Medication Sig Start Date End Date Taking? Authorizing Provider  methocarbamol (ROBAXIN) 750 MG tablet Take 1 tablet (750 mg total) by mouth every 8 (eight) hours as needed for muscle spasms. 08/04/21  Yes Korra Christine, Raynelle FanningJulie, PA-C  predniSONE (DELTASONE) 10 MG tablet 6, 5, 4, 3, 2 then 1 tablet by mouth daily for 6 days total. 08/04/21  Yes Roselin Wiemann, Raynelle FanningJulie, PA-C  acetaminophen (TYLENOL) 500 MG tablet Take 2 tablets (1,000 mg total) by mouth every 6 (six) hours as needed. Patient not taking: Reported on 05/05/2021 03/09/21   Eric FormKabrich, Martha H, PA-C  aspirin EC 81 MG tablet Take 81 mg by mouth daily. Swallow whole.    [provider]  busPIRone (BUSPAR) 15 MG tablet Take 15 mg by mouth in the morning, at noon, and at bedtime. 02/18/21   [provider]  clonazePAM (KLONOPIN) 0.5 MG tablet Take 0.5 mg by mouth daily as needed for anxiety. 02/18/21   [provider]  docusate sodium (COLACE) 100 MG capsule Take 1 capsule (100 mg  total) by mouth 2 (two) times daily. Patient not taking: Reported on 05/05/2021 03/08/21   Diamantina MonksLovick, Ayesha N, MD  escitalopram (LEXAPRO) 20 MG tablet Take 20 mg by mouth daily.    [provider]  ibuprofen (ADVIL) 600 MG tablet Take 1 tablet (600 mg total) by mouth 4 (four) times daily. Patient not taking: Reported on 05/05/2021 03/08/21   Diamantina MonksLovick, Ayesha N, MD  LO LOESTRIN FE 1 MG-10 MCG / 10 MCG tablet Take 1 tablet by mouth daily. 04/07/21   Brock BadHarper, Charles A, MD  NIFEdipine (ADALAT CC) 90 MG 24 hr tablet Take 1 tablet (90 mg total) by mouth daily. 03/21/21   Adam PhenixArnold, James G, MD  oxyCODONE (ROXICODONE) 5 MG immediate release tablet Take 1 tablet (5 mg total) by mouth every 4 (four) hours as needed. Patient not taking: Reported on 04/07/2021 03/08/21   Diamantina MonksLovick, Ayesha N, MD      Allergies    Patient has no known allergies.    Review of Systems   Review of Systems  Constitutional:  Negative for fever.  Respiratory:  Negative for shortness of breath.   Cardiovascular:  Negative for chest pain and leg swelling.  Gastrointestinal:  Negative for abdominal distention, abdominal pain and constipation.  Genitourinary:  Negative for difficulty urinating, dysuria, flank pain, frequency and urgency.  Musculoskeletal:  Positive for back pain. Negative for gait problem and joint swelling.  Skin:  Negative for rash.  Neurological:  Negative for weakness and numbness.   Physical Exam Updated Vital Signs BP (!) 159/83 (BP Location: Right Arm)   Pulse 86   Temp 98.1 F (36.7 C) (Oral)   Resp 17   Ht 5\' 8"  (1.727 m)   Wt (!) 146.1 kg   LMP 07/15/2021 (Approximate)   SpO2 100%   BMI 48.96 kg/m  Physical Exam Vitals and nursing note reviewed.  Constitutional:      Appearance: She is well-developed.  HENT:     Head: Normocephalic.  Eyes:     Conjunctiva/sclera: Conjunctivae normal.  Cardiovascular:     Rate and Rhythm: Normal rate.     Pulses: Normal pulses.  Pulmonary:     Effort:  Pulmonary effort is normal.  Abdominal:     General: Bowel sounds are normal. There is no distension.     Palpations: Abdomen is soft. There is no mass.  Musculoskeletal:        General: Normal range of motion.     Cervical back: Normal range of motion and neck supple.     Lumbar back: Tenderness present. No swelling, edema or spasms.  Skin:    General: Skin is warm and dry.  Neurological:     Mental Status: She is alert.     Sensory: No sensory deficit.     Motor: No tremor or atrophy.     Gait: Gait normal.     Deep Tendon Reflexes:     Reflex Scores:      Patellar reflexes are 2+ on the right side and 2+ on the left side.      Achilles reflexes are 2+ on the right side and 2+ on the left side.    Comments: No strength deficit noted in hip and knee flexor and extensor muscle groups.  Ankle flexion and extension intact.    ED Results / Procedures / Treatments   Labs (all labs ordered are listed, but only abnormal results are displayed) Labs Reviewed  POC URINE PREG, ED    EKG None  Radiology No results found.  Procedures Procedures    Medications Ordered in ED Medications  predniSONE (DELTASONE) tablet 60 mg (has no administration in time range)    ED Course/ Medical Decision Making/ A&P                           Medical Decision Making No neuro deficit on exam or by history to suggest emergent or surgical presentation.  Also discussed worsened sx that should prompt immediate re-evaluation including distal weakness, bowel/bladder retention/incontinence.  Patient presenting with low back pain with radicular symptoms in the distribution of her right sciatic nerve without neural deficits.  This is most consistent with prior history of sciatica.  She has no urinary symptoms.  She has been placed on a prednisone taper, Robaxin prescribed.  Other home treatments, activity, safe lifting,ice and heat were outlined for patient.  Return precautions were  outlined.                 Final Clinical Impression(s) / ED Diagnoses Final diagnoses:  Sciatica of right side    Rx / DC Orders ED Discharge Orders          Ordered    predniSONE (DELTASONE) 10 MG tablet        08/04/21 1038    methocarbamol (ROBAXIN) 750 MG tablet  Every 8 hours PRN  08/04/21 1038              Burgess Amor, PA-C 08/04/21 1039    Terald Sleeper, MD 08/04/21 [phone]

## 2021-08-04 NOTE — ED Triage Notes (Signed)
Pt to the ED with complaints of Lower right side lumbar pain that runs through her right buttock and down the back of her right leg.

## 2021-12-01 ENCOUNTER — Ambulatory Visit: Payer: Medicaid Other

## 2021-12-01 NOTE — Progress Notes (Deleted)
Ms. Mccartney presents today for UPT. She {gen pregnancy complaints obgyn:[id number]::"has no unusual complaints"}. LMP:    OBJECTIVE: Appears well, in no apparent distress.  OB History     Gravida  2   Para  1   Term  1   Preterm      AB  1   Living  1      SAB  1   IAB      Ectopic      Multiple      Live Births  1          Home UPT Result: In-Office UPT result: I have reviewed the patient's medical, obstetrical, social, and family histories, and medications.   ASSESSMENT: Positive pregnancy test  PLAN Prenatal care to be completed at:

## 2022-02-27 IMAGING — US US ABDOMEN LIMITED
2 of 3 series · 15 of 25 positions shown · non-contrast
Comparison: None.

CLINICAL DATA: Right upper quadrant pain.

EXAM:
ULTRASOUND ABDOMEN LIMITED RIGHT UPPER QUADRANT

[Series 1: us abdomen limited · 57 acquisitions, 14 frames shown (1 of 2)]
[im 1/57]
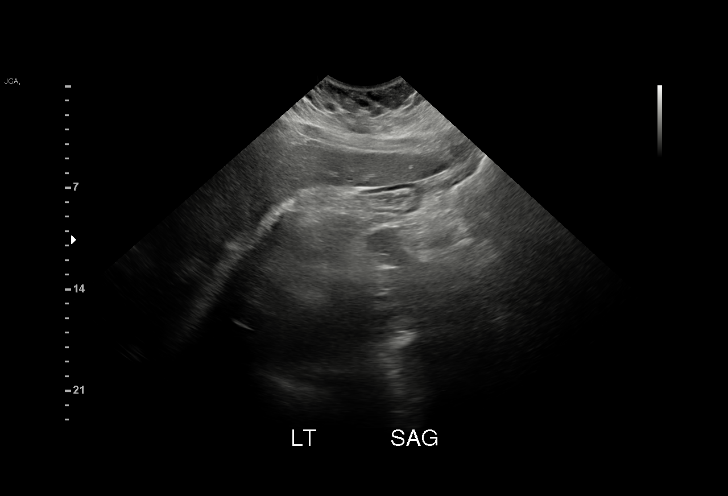
[im 6/57]
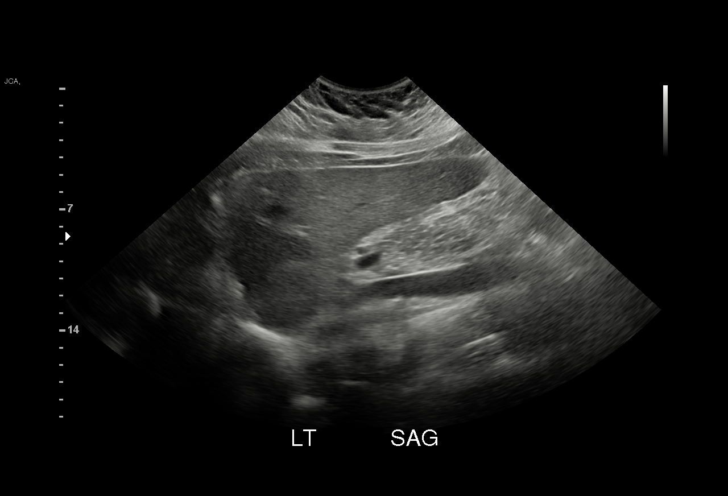
[im 11/57]
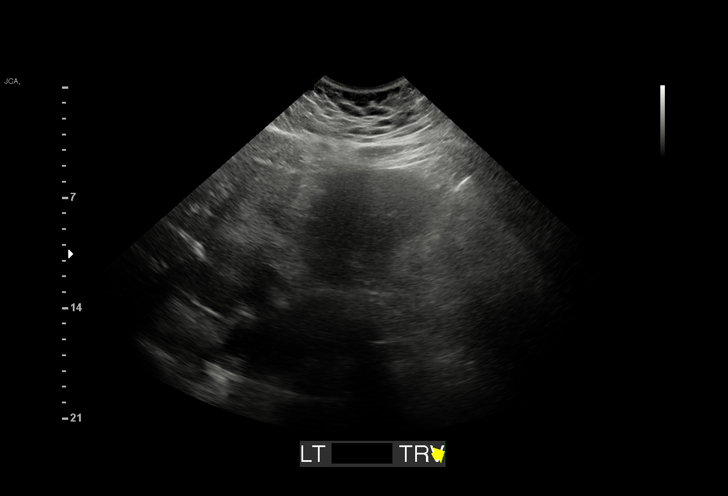
[im 13/57]
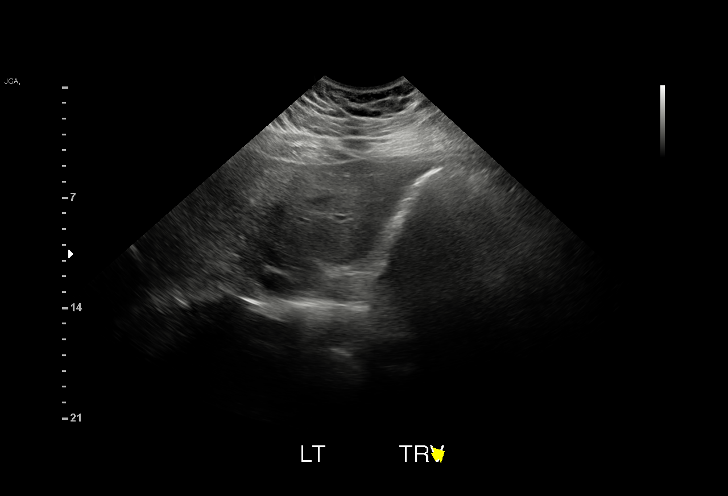
[im 18/57]
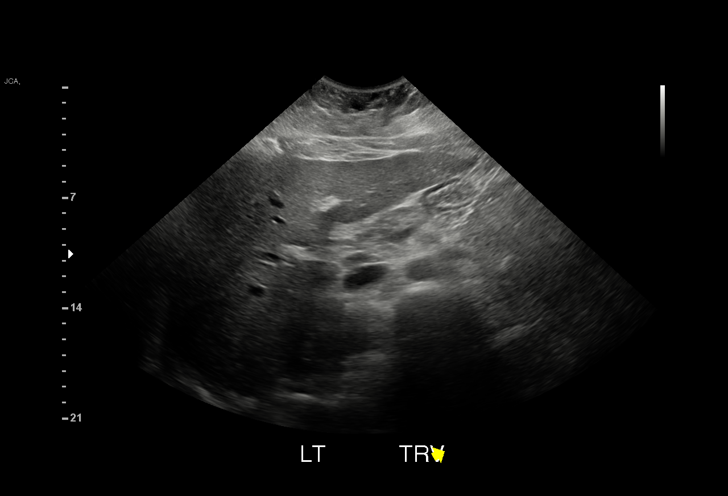
[im 23/57]
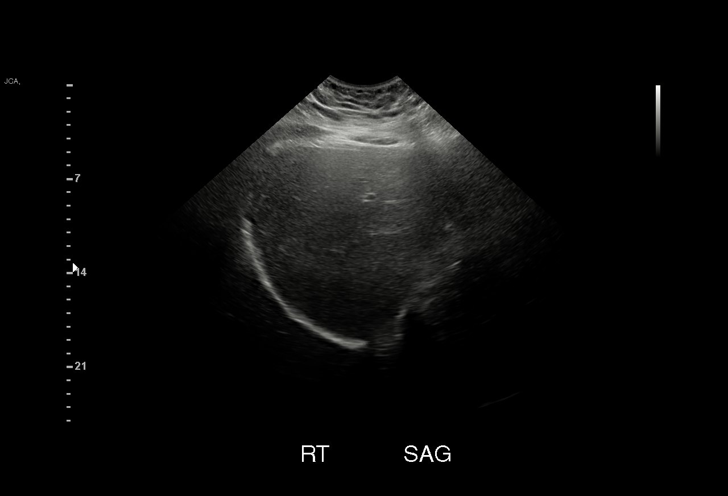
[im 26/57]
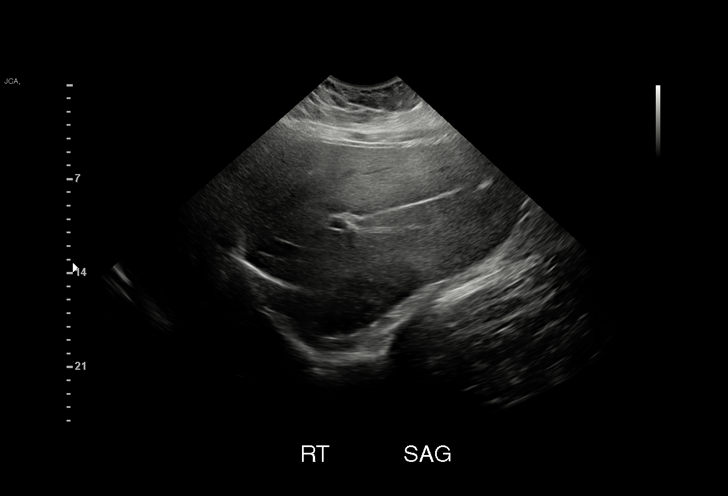
[im 31/57]
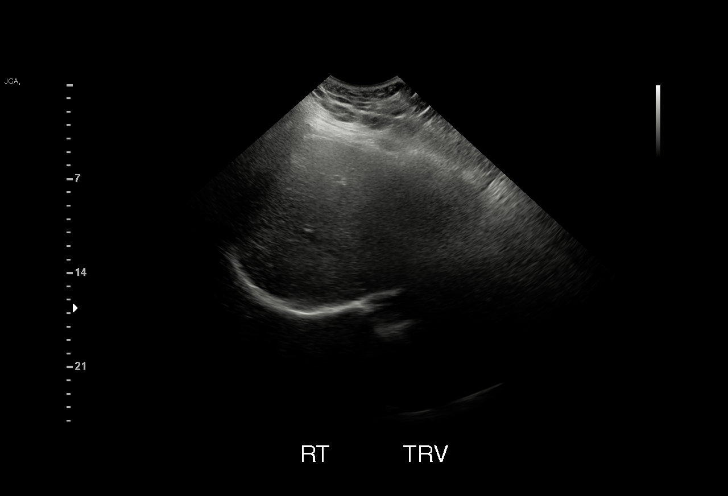
[im 36/57]
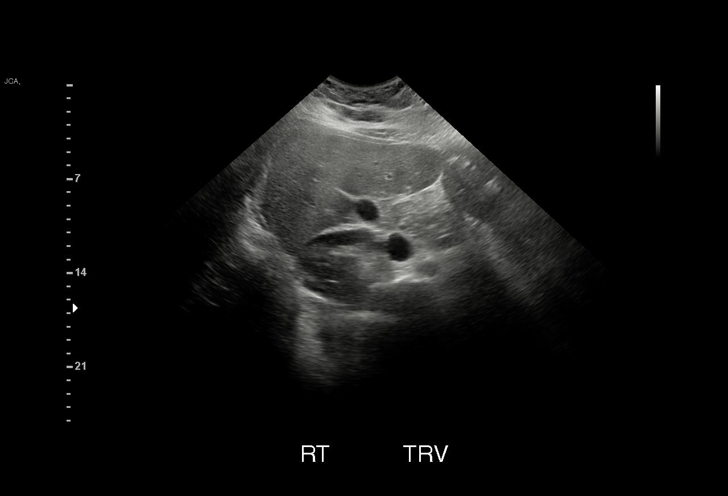
[im 39/57]
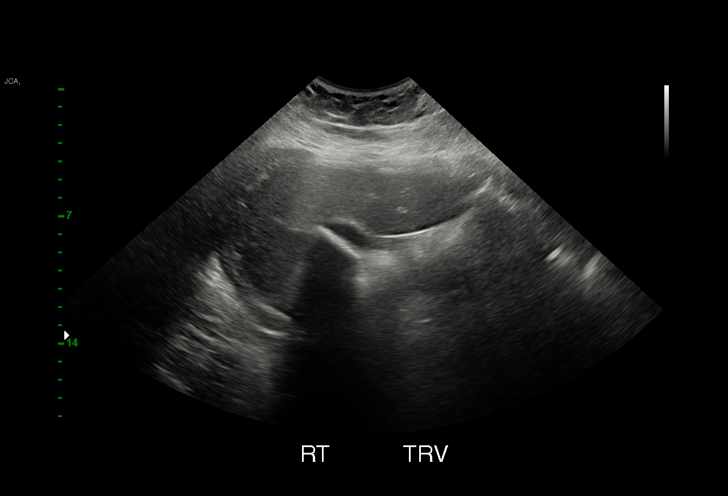
[im 44/57]
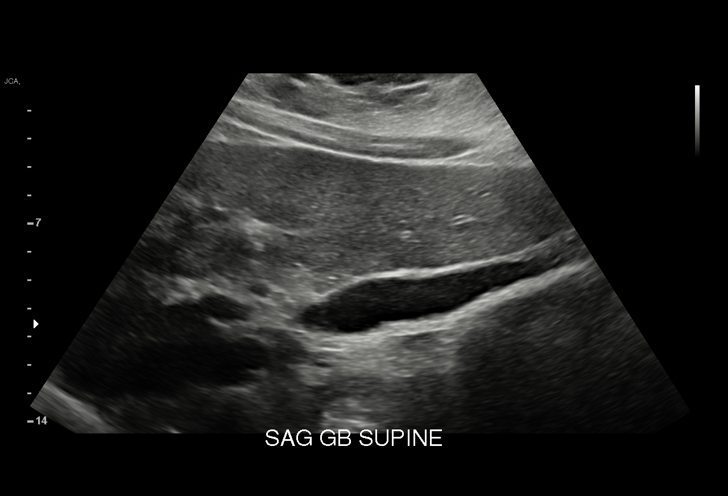
[im 49/57]
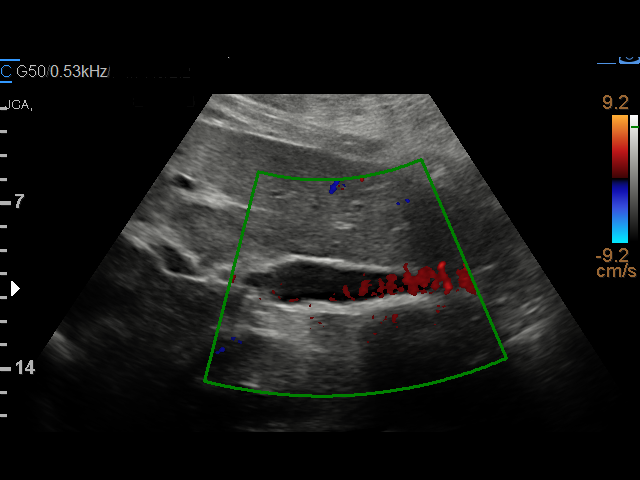
[im 51/57]
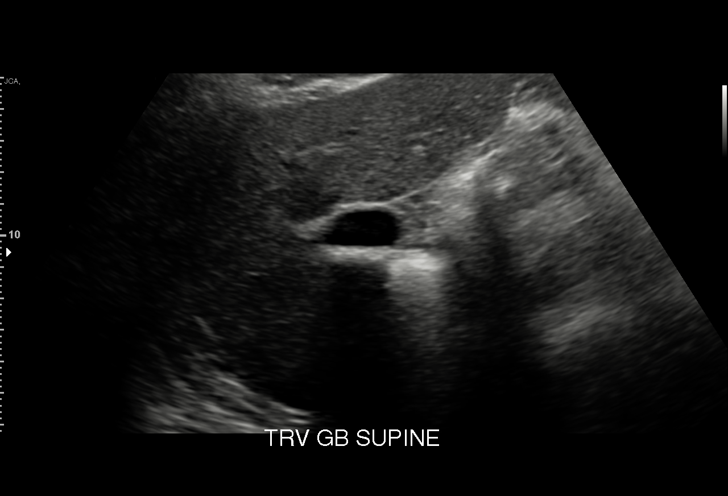
[im 57/57]
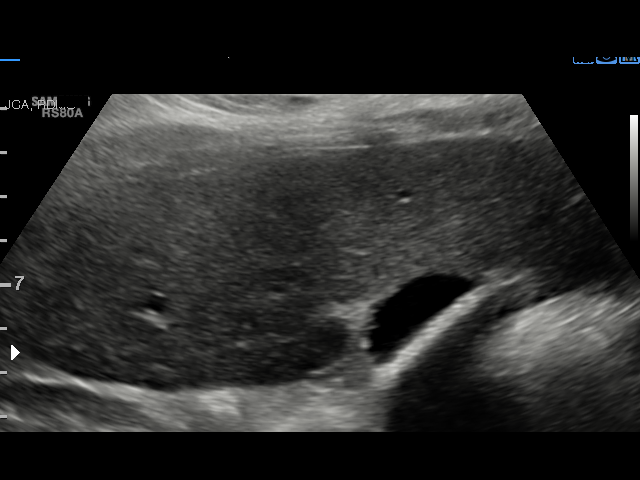

[Series 3: us abdomen limited · 1 of 2 slices shown (2 of 2)]
[im 1/2]
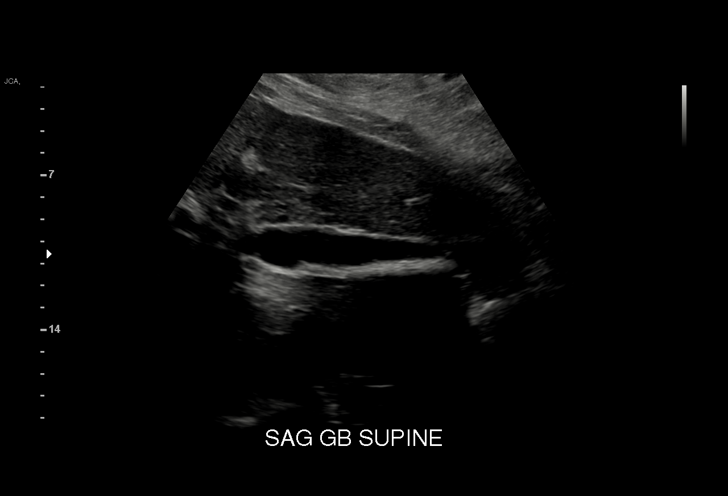

[15 of 25 positions shown; findings below may reference images not displayed]

FINDINGS: Gallbladder:

Multiple small shadowing echogenic gallstones are seen within the
gallbladder lumen. The largest measures approximately 5 mm. The
gallbladder wall measures 2.6 mm in thickness. No sonographic Murphy
sign noted by sonographer.

Common bile duct:

Diameter: 2.8 mm

Liver:

No focal lesion identified. Within normal limits in parenchymal
echogenicity. Portal vein is patent on color Doppler imaging with
normal direction of blood flow towards the liver.

Other: None.
IMPRESSION: Cholelithiasis, without acute cholecystitis.

## 2023-07-17 ENCOUNTER — Inpatient Hospital Stay
Admit: 2023-07-17 | Discharge: 2023-07-18 | Disposition: A | Discharge status: Home / Self Care | Payer: MEDICAID | Attending: Emergency Medicine

## 2023-07-17 DIAGNOSIS — Z3202 Encounter for pregnancy test, result negative: Secondary | ICD-10-CM

## 2023-07-17 NOTE — ED Provider Notes (Signed)
 EMERGENCY DEPARTMENT HISTORY AND PHYSICAL EXAM    Date: 07/17/2023  Patient Name: Stacy Green    History of Presenting Illness     Chief Complaint   Patient presents with    Pregnancy Test    Vaginal Bleeding         History Provided By: Patient    Additional History (Context):   7:51 PM EDT  Stacy Green is a 28 y.o. female with PMHX of chronic headaches, PCOS, increased BMI, alpha thalassemia silent carrier who presents to the emergency department C/O possible pregnancy.  Patient presents with a home positive pregnancy test.  She not having any significant pain or discomfort but did notice some spotting today.  The spotting took place after consensual congress with her partner.  She states she did notice send of cramping but otherwise unremarkable.    Social History  Denies smoking drinking or drugs.    Family History  No bleeding disorders or autoimmune disease.    PCP: Fran Imus, MD    No current facility-administered medications for this encounter.     No current outpatient medications on file.       Past History     Past Medical History:  Past Medical History:   Diagnosis Date    PCOS (polycystic ovarian syndrome)        Past Surgical History:  History reviewed. No pertinent surgical history.    Family History:  History reviewed. No pertinent family history.    Social History:  Social History     Tobacco Use    Smoking status: Never    Smokeless tobacco: Never   Substance Use Topics    Alcohol use: Yes     Alcohol/week: 1.0 standard drink of alcohol    Drug use: Never       Allergies:  No Known Allergies      Physical Exam     Vitals:    07/17/23 1958   BP: (!) 151/90   Pulse: 83   Resp: 16   Temp: 98.4 F (36.9 C)   TempSrc: Oral   SpO2: 100%   Weight: (!) 149.7 kg (330 lb)   Height: 1.727 m (5\' 8" )     Physical Exam  Vitals and nursing note reviewed.   Constitutional:       Appearance: Normal appearance.   HENT:      Head: Normocephalic and atraumatic.      Ears:      Comments: No blood  or fluid from ears or nose     Nose:      Comments: No blood or fluid from ears or nose     Mouth/Throat:      Mouth: Mucous membranes are moist.   Eyes:      Extraocular Movements: Extraocular movements intact.   Neck:      Trachea: Phonation normal.   Cardiovascular:      Rate and Rhythm: Normal rate.   Pulmonary:      Effort: Pulmonary effort is normal.   Abdominal:      General: Abdomen is protuberant.      Palpations: Abdomen is soft.      Tenderness: There is no abdominal tenderness.   Genitourinary:     Comments: GU exam deferred with negative test  Musculoskeletal:         General: No deformity.      Cervical back: No rigidity.   Skin:     General: Skin is warm and  dry.      Capillary Refill: Capillary refill takes less than 2 seconds.   Neurological:      General: No focal deficit present.      Mental Status: She is oriented to person, place, and time.      GCS: GCS eye subscore is 4. GCS verbal subscore is 5. GCS motor subscore is 6.      Cranial Nerves: Cranial nerves 2-12 are intact.      Motor: Motor function is intact.      Coordination: Coordination is intact.   Psychiatric:         Attention and Perception: Attention normal.         Mood and Affect: Mood normal.         Speech: Speech normal.         Behavior: Behavior normal.       Diagnostic Study Results     Labs -  Recent Results (from the past 24 hours)   Urinalysis    Collection Time: 07/17/23  8:00 PM   Result Value Ref Range    Color, UA Yellow      Appearance Clear      Specific Gravity, UA 1.029 1.005 - 1.030      pH, Urine 5.5 5.0 - 8.0      Protein, UA Negative Negative mg/dL    Glucose, Ur Negative Negative mg/dL    Ketones, Urine Trace (A) Negative mg/dL    Bilirubin, Urine Negative Negative      Blood, Urine Small (A) Negative      Urobilinogen, Urine 0.2 0.2 - 1.0 EU/dL    Nitrite, Urine Negative Negative      Leukocyte Esterase, Urine Negative Negative     Urinalysis, Micro    Collection Time: 07/17/23  8:00 PM   Result Value Ref  Range    WBC, UA 0-4 0 - 4 /hpf    RBC, UA 0-5 0 - 2 /hpf    Epithelial Cells, UA Moderate 0 - 20 /lpf    BACTERIA, URINE 2+ (A) Negative /hpf    Mucus, UA 1+ /lpf   HCG, Quantitative, Pregnancy    Collection Time: 07/17/23  8:30 PM   Result Value Ref Range    hCG Quant <1 0 - 10 mIU/mL        Radiologic Studies -   No orders to display     @CT48 @  @CXR48 @    Medications given in the ED-  Medications - No data to display      Medical Decision Making   I am the first provider for this patient.    I reviewed the vital signs, available nursing notes, past medical history, past surgical history, family history and social history.    Vital Signs-Reviewed the patient's vital signs.    Pulse Oximetry Analysis -100% on room air      Records Reviewed: NURSING NOTES AND PREVIOUS MEDICAL RECORDS    Provider Notes (Medical Decision Making):   This acute condition of minor complexity severity.  28 year old female who reported a little cramping and some spotting after coitus.  She had a home pregnancy test the other day that was positive she showed me the picture.  Otherwise she feels well.  We did some blood work and serum blood test she has quant hCG that is undetectable.  Possibly she was pregnant however there is no evidence of any residual pregnancy at this point.  Otherwise her exam is unremarkable.  With  that this is a lab error or missed implantation.  At this point she is medically cleared without requirement of further diagnosis or workup.  Will discharge her outpatient follow-up recommended.      Social Drivers of Health     Tobacco Use: Low Risk  (07/17/2023)    Patient History     Smoking Tobacco Use: Never     Smokeless Tobacco Use: Never     Passive Exposure: Not on file   Alcohol Use: Not At Risk (07/17/2023)    AUDIT-C     Frequency of Alcohol Consumption: Never     Average Number of Drinks: Patient does not drink     Frequency of Binge Drinking: Never   Physicist, medical Strain: Not on file   Food Insecurity: Not  on file   Transportation Needs: Not on file   Physical Activity: Not on file   Stress: Not on file   Social Connections: Not on file   Intimate Partner Violence: Unknown (12/30/2022)    Received from [facility]    Humiliation, Afraid, Rape, and Kick questionnaire     Fear of Current or Ex-Partner: Not on file     Emotionally Abused: Not on file     Physically Abused: No     Sexually Abused: Not on file   Depression: Not on file   Housing Stability: Not on file   Interpersonal Safety: Unknown (12/30/2022)    Received from [facility]    Humiliation, Afraid, Rape, and Kick questionnaire     Fear of Current or Ex-Partner: Not on file     Emotionally Abused: Not on file     Physically Abused: No     Sexually Abused: Not on file   Utilities: Not on file         Procedures:  Procedures    ED Course:   7:51 PM EDT: Initial assessment performed. The patients presenting problems have been discussed, and they are in agreement with the care plan formulated and outlined with them.  I have encouraged them to ask questions as they arise throughout their visit.         Diagnosis and Disposition       DISCHARGE NOTE:  9:36 PM  Stacy Green's  results have been reviewed with her.  She has been counseled regarding her diagnosis, treatment, and plan.  She verbally conveys understanding and agreement of the signs, symptoms, diagnosis, treatment and prognosis and additionally agrees to follow up as discussed.  She also agrees with the care-plan and conveys that all of her questions have been answered.  I have also provided discharge instructions for her that include: educational information regarding their diagnosis and treatment, and list of reasons why they would want to return to the ED prior to their follow-up appointment, should her condition change. She has been provided with education for proper emergency department utilization.     CLINICAL IMPRESSION:    1. Pregnancy test negative         PLAN:  1. D/C Home  2.      Medication List      You have not been prescribed any medications.       3. @DCFOLLOWUP @  _______________________________    This note was partially transcribed via voice recognition software. Although efforts have been made to catch any discrepancies, it may contain sound alike words, grammatical errors, or nonsensical words.  Deronda Flesher, MD  07/23/23 1150

## 2023-07-17 NOTE — ED Triage Notes (Signed)
 Pt. States she took a pregnancy test today and yesterday and both were positive. Pt. States she did have sexual intercourse and noticed bleeding after that in the form of spotting. Pt. Does state she has some cramping in her lower abdomen similar to period cramps.

## 2023-07-18 LAB — URINALYSIS
Bilirubin, Urine: NEGATIVE
Glucose, Ur: NEGATIVE mg/dL
Leukocyte Esterase, Urine: NEGATIVE
Nitrite, Urine: NEGATIVE
Protein, UA: NEGATIVE mg/dL
Specific Gravity, UA: 1.029 (ref 1.005–1.030)
Urobilinogen, Urine: 0.2 EU/dL (ref 0.2–1.0)
pH, Urine: 5.5 (ref 5.0–8.0)

## 2023-07-18 LAB — URINALYSIS, MICRO

## 2023-07-18 LAB — HCG, QUANTITATIVE, PREGNANCY: hCG Quant: <1 m[IU]/mL (ref 0–10)
